# Patient Record
Sex: Male | Born: 1950
Health system: Midwestern US, Community
[De-identification: ages and names within clinical notes are randomized; demographics above are authoritative.]

## PROBLEM LIST (undated history)

## (undated) DIAGNOSIS — I8393 Asymptomatic varicose veins of bilateral lower extremities: Secondary | ICD-10-CM

## (undated) DIAGNOSIS — M79606 Pain in leg, unspecified: Secondary | ICD-10-CM

## (undated) DIAGNOSIS — Z1159 Encounter for screening for other viral diseases: Secondary | ICD-10-CM

## (undated) DIAGNOSIS — F319 Bipolar disorder, unspecified: Secondary | ICD-10-CM

## (undated) DIAGNOSIS — I219 Acute myocardial infarction, unspecified: Secondary | ICD-10-CM

## (undated) MED FILL — THERA  TABS: 30 days supply | Qty: 30 | Fill #0 | Status: AC

## (undated) MED FILL — ASPIRIN LOW DOSE 81MG CHEW: 81 MG | 30 days supply | Qty: 30 | Fill #0 | Status: AC

## (undated) MED FILL — HYDROXYZINE HCL 25MG TABS: 25 MG | 10 days supply | Qty: 30 | Fill #0 | Status: AC

## (undated) MED FILL — LEADER INSULIN SYR 31G X 5/16"X .3ML: 31G X 5/16" 0.3 ML | 30 days supply | Qty: 60 | Fill #0 | Status: AC

## (undated) MED FILL — ALPRAZolam 2MG TAB: 2 MG | 14 days supply | Qty: 21 | Fill #0 | Status: AC

## (undated) MED FILL — THIAMINE MONONITRATE 100MG TABS: 100 MG | 30 days supply | Qty: 30 | Fill #0 | Status: AC

## (undated) MED FILL — PRODIGY TWIST TOP LANCETS 28  MISC: 30 days supply | Qty: 100 | Fill #0 | Status: AC

## (undated) MED FILL — ALBUTEROL SULFATE HFA 108 AERS: 108 (90 Base) MCG/ACT | 15 days supply | Qty: 9 | Fill #0 | Status: AC

## (undated) MED FILL — STIMULANT LAXATIVE 8.6-50MG TABS: 8.6-50 MG | 30 days supply | Qty: 30 | Fill #0 | Status: AC

## (undated) MED FILL — PRODIGY AUTOCODE BLOOD W/DEVICE KIT: w/Device | 30 days supply | Qty: 1 | Fill #0 | Status: AC

## (undated) MED FILL — FOLIC ACID 1MG TABS: 1 MG | 30 days supply | Qty: 30 | Fill #0 | Status: AC

## (undated) MED FILL — LISINOPRIL 10MG TABS: 10 MG | 30 days supply | Qty: 60 | Fill #0 | Status: AC

## (undated) MED FILL — PRODIGY NO CODING BLOOD GLUC  STRP: 30 days supply | Qty: 100 | Fill #0 | Status: AC

## (undated) MED FILL — NOVOLIN N INJ U-100: 100 UNIT/ML | 27 days supply | Qty: 10 | Fill #0 | Status: AC

## (undated) MED FILL — OXYCODONE-ACETAMINOPHEN 5-325 TABS: 5-325 MG | 5 days supply | Qty: 15 | Fill #0 | Status: AC

## (undated) MED FILL — ATORVASTATIN CALCIUM 40MG TABS: 40 MG | 30 days supply | Qty: 30 | Fill #0 | Status: AC

---

## 2008-04-08 ENCOUNTER — Inpatient Hospital Stay (HOSPITAL_COMMUNITY): Admission: EM | Admit: 2008-04-08 | Discharge: 2008-04-11 | Payer: Self-pay | Admitting: Emergency Medicine

## 2008-04-08 ENCOUNTER — Ambulatory Visit: Payer: Self-pay | Admitting: Cardiovascular Disease

## 2008-04-10 ENCOUNTER — Ambulatory Visit: Payer: Self-pay | Admitting: Vascular Surgery

## 2008-04-10 ENCOUNTER — Encounter (INDEPENDENT_AMBULATORY_CARE_PROVIDER_SITE_OTHER): Payer: Self-pay | Admitting: Internal Medicine

## 2008-04-14 ENCOUNTER — Ambulatory Visit: Payer: Self-pay | Admitting: *Deleted

## 2008-07-06 ENCOUNTER — Inpatient Hospital Stay (HOSPITAL_COMMUNITY): Admission: EM | Admit: 2008-07-06 | Discharge: 2008-07-09 | Payer: Self-pay | Admitting: Emergency Medicine

## 2008-07-06 ENCOUNTER — Ambulatory Visit: Payer: Self-pay | Admitting: Internal Medicine

## 2008-07-16 ENCOUNTER — Inpatient Hospital Stay (HOSPITAL_COMMUNITY): Admission: AD | Admit: 2008-07-16 | Discharge: 2008-07-30 | Payer: Self-pay | Admitting: Psychiatry

## 2008-07-16 ENCOUNTER — Encounter: Payer: Self-pay | Admitting: Emergency Medicine

## 2008-07-16 ENCOUNTER — Ambulatory Visit: Payer: Self-pay | Admitting: Psychiatry

## 2008-07-19 ENCOUNTER — Encounter: Payer: Self-pay | Admitting: Emergency Medicine

## 2008-07-26 ENCOUNTER — Encounter (HOSPITAL_COMMUNITY): Payer: Self-pay | Admitting: Psychiatry

## 2008-10-23 ENCOUNTER — Emergency Department (HOSPITAL_COMMUNITY): Admission: EM | Admit: 2008-10-23 | Discharge: 2008-10-23 | Payer: Self-pay | Admitting: Emergency Medicine

## 2008-10-27 ENCOUNTER — Emergency Department (HOSPITAL_COMMUNITY): Admission: EM | Admit: 2008-10-27 | Discharge: 2008-10-28 | Payer: Self-pay | Admitting: Emergency Medicine

## 2010-08-25 IMAGING — CR DG CHEST 2V
2 series · 2 of 2 positions shown · non-contrast
Comparison: 07/26/2008

CLINICAL DATA: 57-year-old male fever, chills, pain

CHEST - 2 VIEW

[w chest pa]
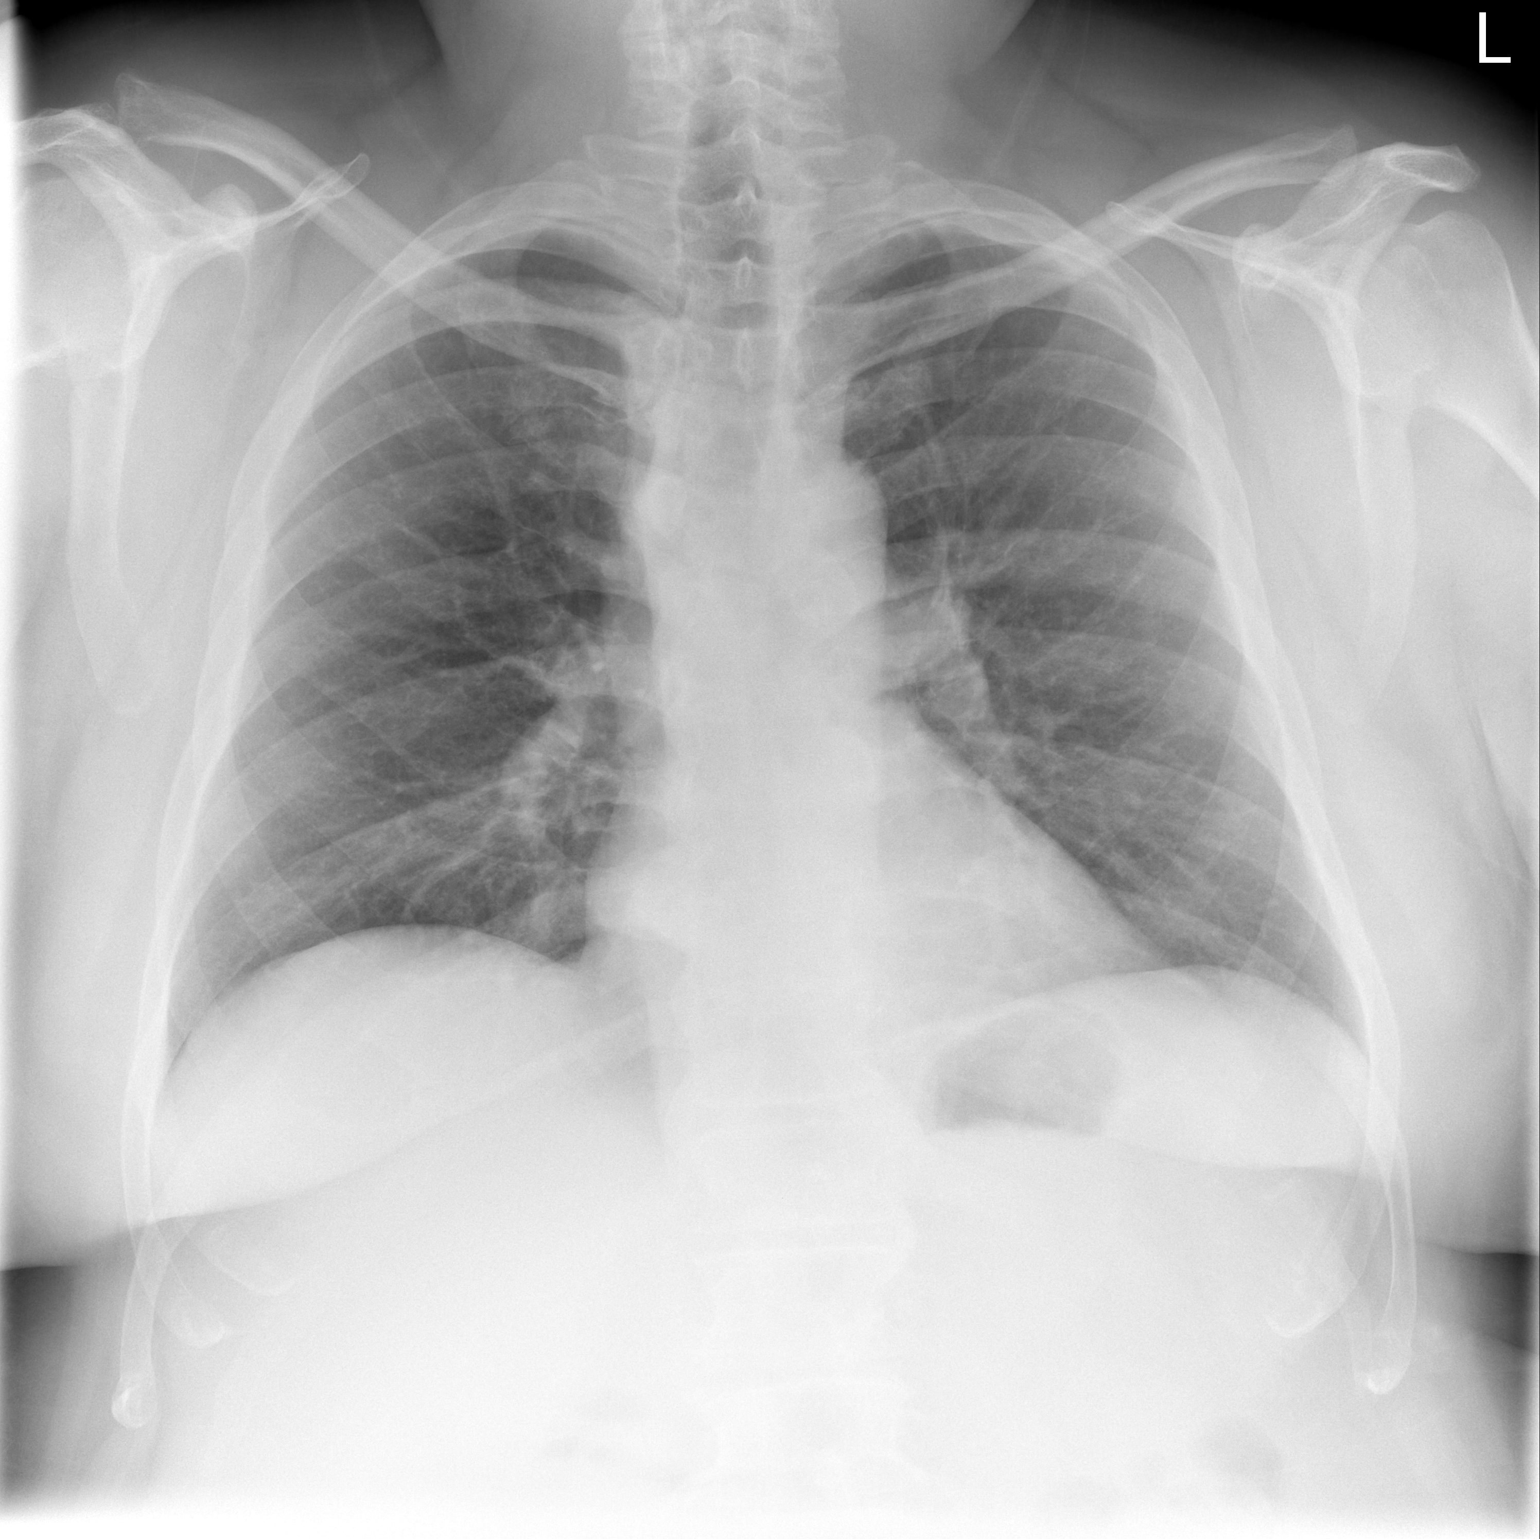

[w chest lat]
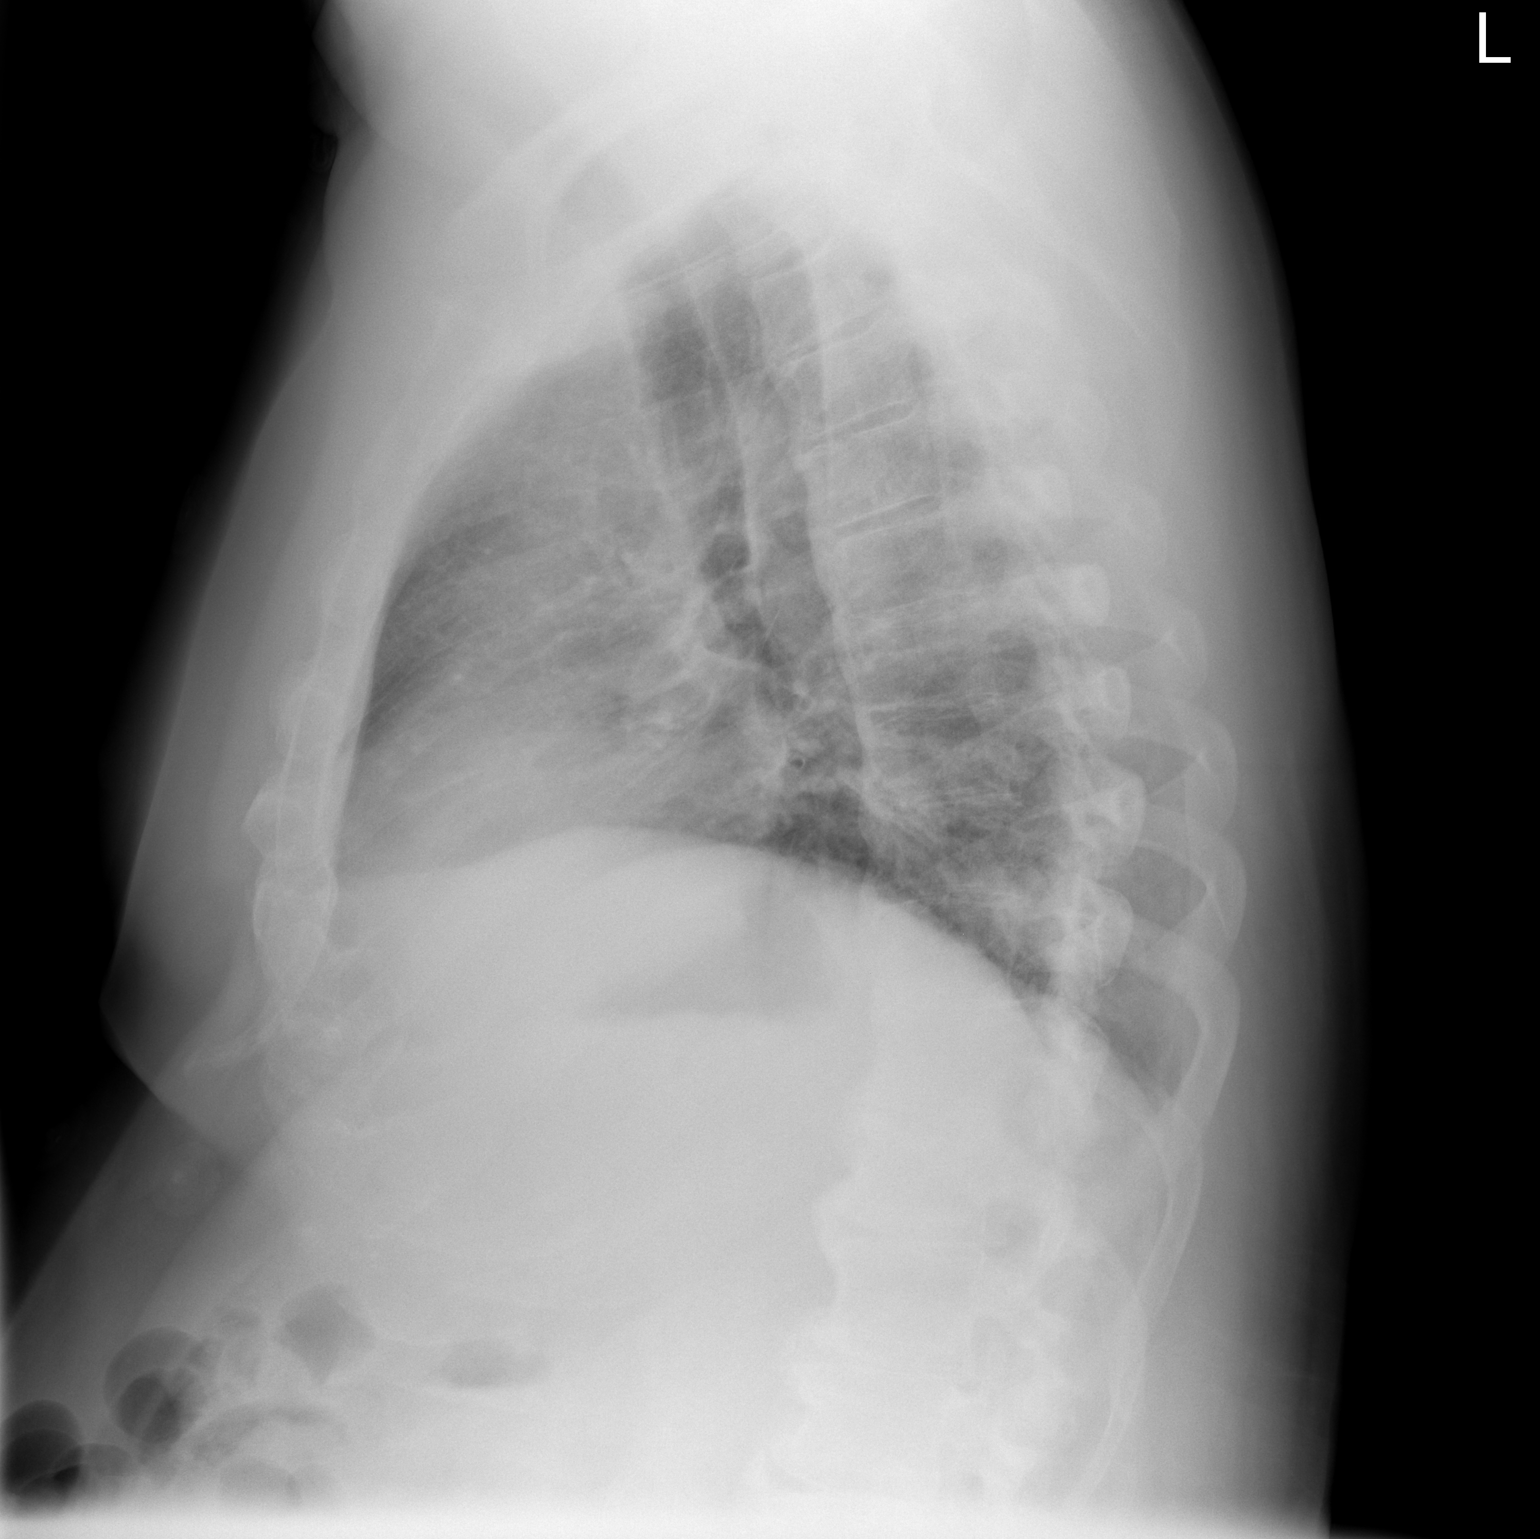

[2 of 2 positions shown; findings below may reference images not displayed]

FINDINGS: The heart size and mediastinal contours are within normal
limits.  Both lungs are clear.  The visualized skeletal structures
are unremarkable. Degenerative changes are noted of the thoracic
spine.
IMPRESSION: No active cardiopulmonary disease.

## 2011-03-14 NOTE — H&P (Signed)
Antonio Sparks, Antonio Sparks             ACCOUNT NO.:  [id number]   MEDICAL RECORD NO.:  [id number]          PATIENT TYPE:  INP   LOCATION:  3707                         FACILITY:  [facility]   PHYSICIAN:  Antonio Sparks, MDDATE OF BIRTH:  1951/06/02   DATE OF ADMISSION:  07/05/2008  DATE OF DISCHARGE:                              HISTORY & PHYSICAL   PRIMARY CARE PHYSICIAN:  Unassigned.   CHIEF COMPLAINT:  Chest pain.   HISTORY OF PRESENT ILLNESS:  A 60 year old male with history of diabetes  mellitus type 2, hypertension, chronic back pain and general anxiety  disorder presented to the ER complaining of chest pain.  The patient has  been having chest pain over the last 2 days which woke him up from sleep  2 days ago which lasted for 10 minutes and got relieved by itself.  Last  evening when he was trying to walk he developed chest pain wherein  person going on the road saw him and picked him up and brought him to  the ER.  The patient stated the chest pain is retrosternal compressing  in nature, radiating to his neck.  The patient in addition also has been  noticed to have wheezing and shortness of breath with some cough and  productive sputum which he stated this was green in color.  Denies any  fever or chills.  Chest pain is constant and radiating to neck, present  at rest and with exertion.  Denies any nausea, but did have diaphoresis.  Denies any diarrhea, abdominal pain, dysuria, loss of consciousness,  dizziness, weakness of limbs.   PAST MEDICAL HISTORY:  1. Diabetes mellitus type 2.  2. Hypertension.  3. COPD.  4. General anxiety disorder.  5. Chronic back pain.   MEDICATION PRIOR ADMISSION:  The patient states he has not been taking  medication as advised for last 1 month.   ALLERGIES:  CODEINE.   SOCIAL HISTORY:  The patient smokes cigarettes and has been advised to  quit smoking.  Denies any alcohol or drug abuse.   FAMILY HISTORY:  The patient's father had MI at  age 8.   REVIEW OF SYSTEMS:  As present in history of present illness.  Nothing  else significant.   PHYSICAL EXAMINATION:  GENERAL:  The patient examined at bedside not in  acute distress.  VITAL SIGNS:  Blood pressure is 140/70, pulse 87 per minute, temperature  97.8, respirations 20 per minute, O2 saturations 97% on room air.  HEENT: Anicteric.  No pallor.  CHEST:  Bilateral air entry present, bilaterally expiratory wheeze.  No  crepitation.  HEART:  S1-S2 heard.  ABDOMEN:  Soft, nontender.  Bowel sounds heard.  No guarding or  rigidity.  CNS:  The patient is alert, awake, oriented to time, place and person.  Moves upper and lower extremities 5/5.  EXTREMITIES:  Peripheral pulses felt.  No edema.   LABORATORY DATA:  CT angio of his chest shows no gross evidence of  pulmonary emboli, no significant change since prior study.  EKG; normal  sinus rhythm with no acute ST-T changes.  Chest x-ray showed  mild  chronic bronchitis changes but no acute pulmonary changes.   CBC; WBC is 10.1, hemoglobin 14.1, hematocrit 41.6, platelets 244,  neutrophils 64%, PT/INR 12.5 and 0.9.  Basic metabolic panel; sodium  139, potassium 4.1, chloride 106, carbon dioxide 27, glucose 150, BUN  15, creatinine 1.1, calcium 9, CK-MB less than 1, troponin I less than  0.05.  Stool for occult is negative.   ASSESSMENT:  1. Chest pain to rule out acute chest syndrome.  2. Chronic obstructive pulmonary disease.  3. Headache.  4. Ongoing tobacco abuse.  5. Diabetes mellitus type 2.  6. Hypertension.  7. Noncompliance.   PLAN:  Admit the patient to telemetry.  Will cycle cardiac markers.  We  will get CT of the head as the patient is complaining of headache.  We  will place the patient on Solu-Medrol, aspirin, bronchodilators, Avelox.  I have advised the patient to quit smoking and further recommendations  as patient's condition evolves.      Antonio Clos, MD  Electronically Signed      ANK/MEDQ  D:  07/06/2008  T:  07/06/2008  Job:  [id number]

## 2011-03-14 NOTE — Discharge Summary (Signed)
Antonio Sparks, Antonio Sparks             ACCOUNT NO.:  [id number]   MEDICAL RECORD NO.:  [id number]          PATIENT TYPE:  INP   LOCATION:  4741                         FACILITY:  [facility]   PHYSICIAN:  Lonia Blood, M.D.DATE OF BIRTH:  [birth year]   DATE OF ADMISSION:  04/08/2008  DATE OF DISCHARGE:  04/11/2008                               DISCHARGE SUMMARY   PRIMARY CARE PHYSICIAN:  [facility].   DISCHARGE DIAGNOSES:  1. Chest pressure, rule out unstable angina pectoris.      a.     Serial cardiac enzymes entirely negative.      b.     Serial EKGs completely normal.      c.     Outpatient risk stratification felt most appropriate.  2. Emphysema/COPD.      a.     Discontinued smoking approximately 15-20 years ago.      b.     Acute infected bronchitis by clinical exam.      c.     Improving with modest neb treatment and antibiotic  3. Diabetes mellitus - well controlled on Lantus.  4. Hypertension.  5. Anxiety disorder/panic attacks.  6. Coronary artery disease reported by patient with history of MI in      [place], [place], but no evidence of such on EKG during his hospital      stay.  7. Chronic low back pain on OxyContin.  8. Status post arthroscopic surgery left knee.  9. Status post colonoscopy approximately 2 years ago with resection of      what the patient reports was a probable adenomatous polyp.   DISCHARGE MEDICATIONS:  1. Advair discus 250/50 one puff b.i.d.  2. Oxycodone SR 40 mg p.o. b.i.d.  3. Ultram 50 mg 1-2 p.o. q.6 h p.r.n. breakthrough pain.  4. Celexa 10 mg p.o. daily.  5. Coreg 6.25 mg p.o. b.i.d.  6. Lantus insulin 10 units subcu q.h.s.  7. Avelox 400 mg p.o. daily x5 days.  8. Xanax 1 mg p.o. t.i.d. p.r.n.   FOLLOW UP:  The patient has already established his primary care with  [facility].  He is advised to keep his scheduled follow-up  appointment there and to be compliant with all of his medications until  that time.  In the outpatient  setting, the patient would be an due for a  simple routine maintenance issues to include repeat colonoscopy for  evaluation of his history of adenomatous colon polyps as well as an  outpatient cardiac stress test.   PROCEDURES:  None.   CONSULTATIONS:  None.   HOSPITAL COURSE:  Mr. Antonio Sparks is a 60 year old gentleman who has  recently moved back to the [place] area.  He appears to be somewhat  of a nomad and has lived in different regions in the last few years.  He  presented primarily with complaint of chest pain and some shortness of  breath.  Out of fear for unstable angina pectoris,  the patient was  admitted to the acute units.  He was monitored on telemetry which  remained normal.  Serial EKGs were obtained which were  entirely  negative.  Serial cardiac enzymes were obtained and were also entirely  negative.  As a result, the patient was not felt to require inpatient  risk stratification further and was cleared for discharge on aspirin and  beta-blocker therapy.   The patient did have symptoms during his hospital stay, however, that  appeared more and more consistent with an acute infective bronchitis.  The patient was placed on IV antibiotics and tolerated this well.  He  will continue Advair in the outpatient setting in addition to Avelox for  five additional days.  He has not smoked in multiple years and he was  advised that he should absolutely not resume doing so.   Fortunately, the patient's diabetes was very easy to control on Lantus  insulin during his hospital stay.  Additionally, blood pressure was very  well-controlled.  He is discharged on above-listed medical regimen.  No  other significant acute medical problems were encountered during his  hospital stay.      Lonia Blood, M.D.  Electronically Signed     JTM/MEDQ  D:  04/11/2008  T:  04/11/2008  Job:  [id number]   cc:   Melvern Banker  Dr. Reche Dixon

## 2011-03-14 NOTE — H&P (Signed)
Antonio Sparks, HORNBACK             ACCOUNT NO.:  [id number]   MEDICAL RECORD NO.:  [id number]          PATIENT TYPE:  IPS   LOCATION:  0500                          [facility]ILITY[facility]  [facility]   PHYSICIAN:  Geoffery Lyons, M.D.      DATE OF BIRTH:  May 07, 1951   DATE OF ADMISSION:  07/16/2008  DATE OF DISCHARGE:  07/19/2008                       PSYCHIATRIC ADMISSION ASSESSMENT   TIME:  9:30 a.m.   IDENTIFYING INFORMATION:  A 60 year old Caucasian male.  This is a  voluntary admission.   HISTORY OF PRESENT ILLNESS:  First [facility] admission for this 60 year old  who presented after relapsing on drugs about 6 months ago.  He has been  on a crack binge for the previous 3 days and says that he has taken  about $600 worth of cocaine in the past 3 days.  He reports over the  course of the past 3 months, his drug use has escalated markedly.  Says  that he had also been doing opiates after his physician had prescribed  him 42 OxyContin tablets for his back pain, reported taking it orally,  IV drug use and snorting it.  Also has been doing heroin whatever he can  get his hands on it and crack several times a day and daily for the past  week.  He cites his escalating drug use to depression and acute grief as  he approaches the 7 year anniversary of his wife's death.  He admits to  severe and protracted grief over her death.  Since her death 7 years  ago, he has been unable to achieve any significant period of drug  abstinence.  He began using substances at the age of 33 starting with  marijuana.  Over the course of his lifetime, he has used LSD, beer,  various forms of liquor, marijuana, crack cocaine and heroin.  He does  have a history of IV drug use.  He has had no significant periods of  sobriety and is seeking long-term care.  His home situation is unstable.  He has been staying in various houses with friends moving from town to  town.  He is estranged from his daughter and having poor family and  social  supports is making things worse for him.  He feels unable to get  stable.  He endorses passive suicidal thoughts without a plan.   PAST PSYCHIATRIC HISTORY:  First [facility] admission.  He is cooperative.  Has  an extensive history of polysubstance abuse.  Very tearful today and not  quite clear on his previous length of sobriety or if he has had any  significant drug rehab treatment outside of short-term detox.  He has  denied  previous suicide attempts.   SOCIAL HISTORY:  Single Caucasian male, widowed.  Has been self-employed  for the past 29 years.  Has skills as a Psychologist, occupational.  Has 1 daughter that he  is estranged from.  She does not want to see until he cleans up and is  drug free.  He has no stable home situation.  No current legal charges.   MEDICAL HISTORY:  No regular primary care physician.  He has been seen  in the emergency room and urgent care.  Has significant history of  diabetes mellitus, hypertension, some arthritis and chronic back pain.   CURRENT MEDICATIONS:  1. Seroquel 50 mg t.i.d.  2. Xanax 2 mg t.i.d.  3. OxyContin 40 mg sustained release b.i.d.  4. Advair Discus 250/50 1 puff b.i.d.  5. Gabapentin 100 mg p.o. t.i.d.  6. Celexa 20 mg daily.  7. Elavil 50 mg p.o. q.h.s.  8. Metformin 500 mg daily.  9. NovoLog 70/30 insulin 6 units at 8 a.m. and 2 p.m.  10.Lantus insulin 10 units q.h.s.  11.Lisinopril 20 mg daily.  12.Aspirin 325 mg daily.  13.He also takes Zocor 40 mg daily.  14.Synthroid 25 mcg daily.  15.He has taken Lasix in the past, not currently taking this.  His      medication compliance is sketchy at best.   DRUG ALLERGIES:  CODEINE.   PHYSICAL EXAMINATION:  GENERAL:  Physical exam was done in the emergency  room as noted in the record.  He was cooperative, but a bit agitated in  the emergency room and did receive some Haldol there.  Physical exam was  fairly unremarkable as noted in the record.  VITAL SIGNS:  Temperature 97.4, pulse 98, respirations  18, blood  pressure 148/85, 5 feet 11 inches tall, 310 pounds, temperature 98.1,  pulse 82, respirations 22, blood pressure 149/92.   LABORATORY DATA:  Alcohol level less than 5.  His urine drug screen  positive for cocaine and benzodiazepines.  CBC:  WBC 12.9, hemoglobin  14.1, hematocrit 42, platelets 266,000 and MCV 87.8.  Chemistry:  Sodium  140, potassium 4.3, chloride 104, carbon dioxide 27, BUN 13, creatinine  1.13, random glucose 160.  Liver enzymes:  SGOT 21, SGPT 24, alkaline  phosphatase 69 and total bilirubin 0.6.  Routine urinalysis was  remarkable for cloudy yellow urine with a specific gravity of 1.025 and  negative for ketones, bilirubin, glucose and protein.  No WBCs noted.   DIAGNOSTICS:  Chest x-ray was clear other than some chronically  increased interstitial lung markings noted.   MENTAL STATUS EXAM:  Fully alert, cooperative, very tearful, mildly  agitated.  Feels hopeless after the death of his wife.  Having passive  suicidal thoughts about wanting to be with her.  Feels hopeless about  being able to get his life together and achieve any type of abstinence.  Mood is very depressed.  Speech is normal.  He is oriented x4 and in  full contact with reality.  No delusional statements.  Thinking is  linear, goal directed.  Speech is relevant.  Insight is adequate.  Impulse control guarded.  Immediate recent and remote memory are intact.  No signs of delirium or confusion.   AXIS I:  Depressive disorder, not otherwise specified.  Alcohol abuse  rule out dependence.  Opiate abuse rule out dependence.  Polysubstance  abuse.  AXIS II:  Deferred.  AXIS III:  Diabetes mellitus type 2, hypertension, history of chronic  obstructive pulmonary disease, some chronic back pain.  AXIS IV:  Severe issues with protracted grief and lack of adequate  social supports.  AXIS V:  Current 36, past year not known.   PLAN:  Voluntarily admit him with a goal of a safe detox in 5 days  and  stabilize his mood.  He is admitted to our dual diagnosis program.  We  will continue his Seroquel at this point. Continue his routine diabetes  medications.  We will start him on a Librium detox protocol and evaluate  him for need for a clonidine detox protocol for the opiates.      Margaret A. Scott, N.P.      Geoffery Lyons, M.D.  Electronically Signed    MAS/MEDQ  D:  07/21/2008  T:  07/22/2008  Job:  [id number]

## 2011-03-14 NOTE — Discharge Summary (Signed)
Antonio Sparks, Antonio Sparks             ACCOUNT NO.:  [id number]   MEDICAL RECORD NO.:  [id number]          PATIENT TYPE:  INP   LOCATION:  3708                         FACILITY:  [facility]   PHYSICIAN:  Eduard Clos, MDDATE OF BIRTH:  1951-01-18   DATE OF ADMISSION:  07/05/2008  DATE OF DISCHARGE:  07/09/2008                               DISCHARGE SUMMARY   COURSE IN THE HOSPITAL:  A 60 year old male with a history of COPD,  diabetes mellitus type 2, and chronic back pain, presented with chest  pain and shortness of breath.  On admission, the patient had EKG and  cardiac enzymes, which were serially followed, which were negative for  any acute findings.  The patient had a cardiology consult.  The patient  underwent cardiac cath as per Cardiology.  This patient's cardiac cath  did not show anything significant coronary artery disease, and his left  ventricular ejection fraction was normal.  The patient's TSH was found  to be mildly elevated at 5.084, for which low-dose Synthroid was  started, and LDL was around 110, for which Zocor also was started.  The  patient on admission was found to be wheezing for which IV steroids was  initially started, and empiric antibiotics were started.  The patient's  condition gradually improved.  CT angio chest done did not show any  gross evidence of any pulmonary embolus.  CT head showed questionable  low density in the both occipital lobes.  This could be seen and  posterior reversible encephalopathy that is hypertensive encephalopathy.  There was no sign of hemorrhage.  At this time, as the patient's  symptoms have largely resolved.  The patient is symptomatic.  The  patient is discharged home with advice to follow up with his primary  care physician within a month and recheck his complete metabolic panel  and TSH as he is being started on Zocor and Synthroid, and I would  strongly advice to quit smoking and to be compliant with medications.   ASSESSMENT:  1. Atypical chest pain, resolved.  2. Status post cardiac catheterization.  3. Chronic obstructive pulmonary disease exacerbation.  4. Diabetes mellitus type 2, noncompliant.  5. Chronic low back pain.  6. History of generalized anxiety disorder.  7. Ongoing tobacco abuse.   MEDICATION ON DISCHARGE:  1. Xanax 2 mg p.o. 3 times daily.  2. OxyContin SR 40 mg twice daily.  3. Advair Diskus 250/50 one puff twice daily.  4. Metformin 500 mg p.o. daily from July 10, 2008.  5. Lantus insulin 10 units subcutaneous at bedtime.  6. Lisinopril 20 mg p.o. daily.  7. Avelox 400 p.o. daily for 3 more days.  8. Synthroid 25 mcg p.o. daily.  9. Prednisone tapering dose.  10.Zocor 40 mg p.o. daily.  11.NovoLog insulin 3 meals daily; 150-200, two units subcutaneous; 201-      250, four units subcutaneous; 251-300, six units; 301-350, eight      units subcutaneous; 351-400, ten units subcutaneous.   PLAN:  The patient is advised to follow with his primary care physician  within a month's time.  Recheck his  complete metabolic panel and TSH, to  be compliant with his medication, to be on a cardiac healthy carb-  modified diet, advised to quit smoking, maintain a blood sugar log, and  be cautious about hypoglycemia.      Eduard Clos, MD  Electronically Signed     ANK/MEDQ  D:  07/09/2008  T:  07/09/2008  Job:  [id number]

## 2011-03-14 NOTE — Consult Note (Signed)
Antonio Sparks             ACCOUNT NO.:  [id number]   MEDICAL RECORD NO.:  [id number]          PATIENT TYPE:  INP   LOCATION:  3708                         FACILITY:  [facility]   PHYSICIAN:  Bevelyn Buckles. Bensimhon, MDDATE OF BIRTH:  22-Jan-1951   DATE OF CONSULTATION:  07/07/2008  DATE OF DISCHARGE:                                 CONSULTATION   REFERRING PHYSICIAN:  Altha Harm, MD, [facility].   CARDIOLOGIST:  New to [facility].   PRIMARY CARE PHYSICIAN:  [facility].   REASON FOR CONSULTATION:  Chest pain.   Mr. Antonio Sparks is a 60 year old male with multiple medical problems  including obesity, hypertension, diabetes, COPD with ongoing tobacco  use, chronic back pain and a history of polysubstance abuse with  possible bipolar disorder as well.  He tells me that he underwent heart  catheterization in [place], [place] 4 years ago after having a positive  stress test.  He describes a balloon angioplasty, but does not think  they put a stent in.   He was admitted to [facility] in June of 2009 with chest pain.  He ruled out for myocardial infarction with serial cardiac markers.  He  was told to have an outpatient follow-up, but never followed up.  Recently he ran out of his medications and noticed that he has been  having chronic dyspnea on exertion.  The night before admission he had  an episode of chest pain and shortness of breath which lasted about 10  minutes and felt like a weight was sitting on his chest.  This resolved  spontaneously.  Yesterday he was reading and he got up to move around  and had pain in his neck which radiated to his left arm and then had  severe chest pain like a sledgehammer hit him in the chest.  He actually  doubled over and someone saw him passing by and told him to go to the  emergency room.   In the emergency room he had a CAT scan of the chest which showed no  evidence of pulmonary emboli.  Chest x-ray did show bronchitic  findings,  but no acute infiltrate.  He has had three sets of cardiac markers which  all have been negative.  His EKG is normal.  He does have a severe cough  with productive sputum.  However, he denies any current fevers or  chills.   REVIEW OF SYSTEMS:  Diffusely positive for orthopnea, PND, occasional  lower extremity edema, cough, wheezing, dysuria, hematuria, nocturia,  depression, anxiety, chronic back pain, arthritis, occasional diarrhea,  an episode of dark stool couple of days ago which has resolved, chronic  abdominal pain, constipation, occasional dysphasia.  The remainder of  the review of systems is negative except for as described above.   PAST MEDICAL HISTORY:  1. Reported coronary artery disease with cardiac catheterization 4      years ago in [place].  2. Hypertension.  3. Diabetes.  4. Anxiety/panic attacks.  5. Morbid obesity.  6. COPD with ongoing tobacco use.  7. Chronic back pain.  8. Hyperlipidemia.  9. History of polysubstance  abuse with previous cocaine use.   CURRENT MEDICATIONS:  1. Aspirin 325 a day.  2. Protonix.  3. Albuterol.  4. Atrovent.  5. Nitroglycerin paste.  6. Lantus insulin.  7. Lovenox.  8. Prednisone.  9. Levothyroxine.  10.Xanax 2 mg t.i.d.  11.Metformin 500 a day.  12.Lisinopril 20 a day.  13.OxyContin.  14.Advair.  15.Lopressor 25 b.i.d.  16.IV Avelox.   ALLERGIES:  CODEINE.   SOCIAL HISTORY:  Lives in [place] with a friend.  He is on  disability.  He has a history of tobacco.  He says he is smoking now one  cigarette per day.  He has a history of alcohol, but quit.  He also has  a history of cocaine use, but stopped 25 years ago.   FAMILY HISTORY:  Mother died at 3 due to colon cancer.  Father died 25  due to MI.  He had an MI at age 92.  He had a brother who died in his  88s due to MI.   PHYSICAL EXAMINATION:  He is sitting up in the side of the bed.  He  speaks with a very pressured speech.  He is otherwise in no  acute  distress.  He has a hacking cough.  Blood pressure is 146/84, heart rate 66.  His temperature is 97.2.  His  weight is 138 kg.  He is sating 96% on room air.  HEENT:  Normal.  NECK:  Supple and thick, unable to evaluate JVD.  Carotids are 2+  bilaterally without bruits.  There is no lymphadenopathy or thyromegaly  appreciated.  CARDIAC:  PMI is nonpalpable.  He is a regular rate and rhythm.  No  obvious murmurs, rubs or gallops.  LUNGS:  Diffuse rhonchi with no current wheezing.  ABDOMEN:  Morbidly obese, unable to appreciate hepatosplenomegaly or  masses.  There are no bruits.  There is good bowel sounds.  EXTREMITIES:  Warm with no cyanosis or clubbing.  There is trace edema  at the ankles.  No rash.  NEURO:  He is alert and oriented x3 with a very pressured speech.  Cranial nerves II-XII are grossly intact.  Moves all four extremities  without difficulty.   EKG shows normal sinus rhythm, rate of 85 with no ST-T wave  abnormalities.  White count 10.1, hemoglobin is 14.1, platelets are 244.  Sodium 139, BUN of 15, creatinine 1.1, glucose 150, hemoglobin A1c is  6.3.  CK-MB of 57, MB is 0.8, troponin 0.02, 0.01, 0.01.   ASSESSMENT/PLAN:  Mr. [name] is a 60 year old man as above with  multiple medical problems including reported coronary artery disease  status post catheterization 4 years ago in [place] and he  now presents  with two episodes of chest pain with both typical and atypical features.  ECG and cardiac markers were negative.  He has significant bronchitis on  exam.  I discussed with him the possibility of Myoview versus cardiac  catheterization and he is adamant that he wants to proceed with the  catheterization.  I explained the risk to him in depth.  He understands  these.  We will try to proceed with cath later today.  He will need  aggressive risk factor management.  Treatment of his bronchitis will be  per his primary team.      Bevelyn Buckles. Bensimhon, MD   Electronically Signed     DRB/MEDQ  D:  07/07/2008  T:  07/07/2008  Job:  [id number]

## 2011-03-14 NOTE — Cardiovascular Report (Signed)
Antonio Sparks, Antonio Sparks             ACCOUNT NO.:  [id number]   MEDICAL RECORD NO.:  [id number]          PATIENT TYPE:  INP   LOCATION:  3708                         FACILITY:  [facility]   PHYSICIAN:  Arturo Morton. Riley Kill, MD, FACCDATE OF BIRTH:  [birth year]   DATE OF PROCEDURE:  DATE OF DISCHARGE:                            CARDIAC CATHETERIZATION   INDICATIONS:  The patient is a 60 year old admitted with bronchitis-type  illness, but also with chest pain.  He was seen by Dr. Gala Romney and  subsequently set up for diagnostic cath.  The patient also has diabetes.   PROCEDURES:  1. Left heart catheterization.  2. Selective coronary arteriography.  3. Selective left ventriculography.   DESCRIPTION OF PROCEDURE:  The patient was brought to the  catheterization laboratory and prepped and draped in the usual fashion.  Through an anterior puncture, the femoral artery was entered.  A 5-  French sheath was placed using left and right coronaries obtained in  multiple angiographic projections.  Central aortic and left ventricular  pressures measured with pigtail.  Ventriculography was done in the RAO  projection.  There were no complications.  He received 2.5 mg of  intravenous Versed and 50 mg of fentanyl.  Even with this, it had little  impact.   The patient, however, tolerated everything quite well.   HEMODYNAMIC DATA:  1. Central aortic pressure is 127/80, mean 94.  2. Left ventricular pressure was 120/17.  3. There was no gradient on pullback across aortic valve.   ANGIOGRAPHIC DATA:  1. Ventriculography was done in the RAO projection.  Overall, systolic      function is vigorous.  No segmental wall motion abnormalities are      identified.  2. The left main is free of critical disease.  3. The left anterior descending artery courses to the apex and      provides 1 major diagonal branch.  The LAD is relatively smooth and      without significant focal narrowing.  4. The circumflex  provides a first marginal and then a large      bifurcating marginal circumflex and its branches are free of      critical disease.  5. The right coronary is a moderate size vessel, provides posterior      descending and posterolateral system both of which are free of      critical disease.   CONCLUSION:  1. Preserved left ventricular function.  2. No significant coronary obstruction.  3. 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  This is notable      on fluoroscopy and picture was taken.   PLAN:  The patient does not need further workup for coronary artery  disease at the present time.  He has had some difficulty swallowing.  His CT was negative for pulmonary emboli.  We will defer further workup  to the primary team.      Arturo Morton. Riley Kill, MD, [facility]  Electronically Signed     Arturo Morton. Riley Kill, MD, [facility]  Electronically Signed    TDS/MEDQ  D:  07/07/2008  T:  07/08/2008  Job:  [id number]

## 2011-03-14 NOTE — H&P (Signed)
[name], Sparks             ACCOUNT NO.:  [id number]   MEDICAL RECORD NO.:  [id number]          PATIENT TYPE:  INP   LOCATION:  1829                         FACILITY:  [facility]   PHYSICIAN:  Lonia Blood, M.D.DATE OF BIRTH:  12-Jan-1951   DATE OF ADMISSION:  04/08/2008  DATE OF DISCHARGE:                              HISTORY & PHYSICAL   PRIMARY CARE PHYSICIAN:  Unassigned.   CHIEF COMPLAINT:  Chest pain/substernal chest pressure.   HISTORY OF PRESENT ILLNESS:  Mr. Antonio Sparks is a 60 year old  gentleman who has recently moved to the [facility] area (as of April 04, 2008).  It appears that his primary medical care has been very  sketchy/spotty, because he has been moved multiple times in the last few  months.  He reports that last night prior to going to bed he developed a  sudden onset of acute substernal chest pressure.  This was described as  a heaviness on the chest.  This was associated with a sense that he  could not catch his breath.  He also became quite diaphoretic during his  spell.  He went to bed and slept without difficulty at the shelter where  he is currently residing.  This morning when he woke up, he fell weak in  general but had no specific chest pain.  He was coughing and  intermittently bringing up large volumes of primarily clear sputum.  After walking out of the homeless shelter, however, he began to  experience a substernal chest pressure again.  This was associated with  a significant sense of diaphoresis and flushing.  He became  diaphoretic again.  Ultimately when staff at the shelter noticed him,  they suggested that he be evaluated, the patient was brought via EMS to  the ER for evaluation.  At the present time, the patient states he  continues to have some substernal chest pressure.  No specific  intervention that has been provided in the ER has significantly altered  his pain.   The patient is somewhat circuitous in his history.  It is  very difficult  to encourage him to focus on one specific issue.  He has multiple  complaints, and it is very difficult to determine if these are all acute  or chronic in nature.   REVIEW OF SYSTEMS:  The patient does admit that he has had some blood in  stool.  He denies melanotic stools.  He does report some blood streaking  and intermittent maroon-colored stools.  This apparently has been going  on for at least one week.  The patient also complains of abdominal pain.  This is associated with intermittent loose diarrhea.  The exact  frequency and duration of this diarrhea is unclear to me.  The patient  denies fevers or chills.  He denies severe nausea or vomiting.  The  patient reports severe chronic weakness in his left leg which appears to  be primarily related to left knee/joint issues.  He reports he has had  an arthroscopic surgery in this area in the past.  He also complains of  severe chronic low  back pain.  It does not appear that he has had  surgery on this in the past, and he denies specific traumatic events.  The patient is quite anxious and at times tremulous.  He states he does  have a history of anxiety disorder and that he has run out of his  benzodiazepines within the last week.  In short, patient basically has a  positive complaint for every single element of the review of systems  that is asked.  For the sake of brevity, I have included in this  dictation only those elements which appear to be relatively acute or  warrant inpatient follow-up.   PAST MEDICAL HISTORY:  (per patient)  1. Hypertension.  2. Diabetes mellitus.  3. Anxiety disorder/panic attacks.  4. Possible emphysema with prior history of tobacco abuse reportedly      discontinued 20 years ago.  5. Reported history of coronary artery disease status post MI but no      apparent catheterization with treatment being administered in      [place], [place], per patient.  6. Chronic low back pain.  7. Status  post apparent arthroscopic surgery, left knee.  8. Status post colonoscopy approximately two years ago with reported      resection of 3 polyps, 1 of which may have been adenomatous.   OUTPATIENT MEDICATIONS:  Dosages unclear.  1. Combivent.  2. Adalat.  3. Advair.  4. Lantus insulin 10 units every night.  5. Metformin 500 b.i.d.  6. Novolin 70/30 6 in the morning and 7 units in the evening/  7. Xanax 0.5 mg q.8h.  8. OxyContin 40 mg b.i.d.  9. Ultram.  10.Celexa.   ALLERGIES:  CODEINE.   FAMILY HISTORY:  The patient's mother died reportedly from colon cancer  at age 59.  The patient's father died from an MI at 73.  The patient has  a brother died of an MI in his late 44s.   SOCIAL HISTORY:  The patient moved to [facility] from [place],  [place], on April 04, 2008.  He is presently residing in a homeless  shelter but is working on more permanent arrangements.  He denies  alcohol or illicit drug use.   DATA REVIEWED:  Point of care cardiac markers are negative x2.  CBC is  unremarkable with exception of elevated white count at 14.  Sodium,  potassium, chloride, bicarb, BUN, and creatinine are normal.  Serum  glucose is elevated at 128.  Chest x-ray reveals no acute disease but  raises the question of a right paratracheal isolated enlarged lymph  node.  A 12-lead EKG reveals normal sinus rhythm at 78 beats per minute  with no acute ST or T-wave change to my interpretation.   PHYSICAL EXAMINATION:  Temperature 97.1, blood pressure 111/65, heart  rate 78, respiratory rate 32, O2 saturations 98% on room air.  CBG is  127.  GENERAL:  Disheveled, poorly kempt gentleman in no acute respiratory  distress who is somewhat tremulous and anxious.  HEENT:  Normocephalic, atraumatic.  Pupils equal, round, and react to  light and accommodation.  Extraocular muscles intact bilaterally.  OC/OP  clear.  Very poor dental health with multiple carious teeth.  NECK:  No JVD.  No  lymphadenopathy, no thyromegaly.  LUNGS:  Poor air movement throughout all fields.  No focal crackles.  No  appreciable wheeze at the present time.  CARDIOVASCULAR:  Distant but regular without gallop or rub with normal  S1-S2.  No  appreciable murmur.  ABDOMEN:  Obese, distended, soft, tender to deep palpation throughout  all 4 quadrants with no focal tenderness.  No rebound, no appreciable  organomegaly.  EXTREMITIES:  1+ bilateral lower extremity edema without cyanosis or  clubbing.  NEUROLOGIC:  Alert and oriented x4.  Cranial nerves II-XII intact  bilaterally, 5/5 strength bilateral upper and lower extremities, intact  sensation to touch throughout.   IMPRESSION AND PLAN:  1. Chest pain - the patient certainly has multiple risk factors.  He      also describes what could easily be interpreted as unstable angina      pectoris.  There is no evidence of an acute MI on EKG or point care      cardiac markers.  I will admit the patient to the telemetry unit.      He will be placed on full-dose heparin as well as a nitroglycerin      drip.  We will add a beta blocker.  I will not use an ACE inhibitor      at the present time, but we will consider adding this during his      hospital stay.  Echocardiogram will be accomplished to evaluate for      wall abnormalities.  Serial EKGs and enzymes will be followed out.      Further risk stratification will be carried out as indicated based      upon the results of the above-listed tests.  2. Possible mediastinal lymphadenopathy - in this patient who presents      with substernal chest pressure and significant anxiety, I will      proceed with a CT scan of the chest to evaluate his possible      lymphadenopathy.  I will make this a CT angiogram as the patient      would have risk factors for pulmonary embolism/DVT, and this will      allow [place] to answer both questions at once.  If there is evidence of      lymphadenopathy or pulmonary lesion, we  will investigate this      further as indicated.  3. Hypertension - the patient's blood pressure is currently well-      controlled.  We will initiate a beta blocker for indications as      noted above and follow his blood pressure trend.  Given the fact      that he is also diabetic, I wish to initiate an ACE inhibitor      during the patient's hospital stay.  We will assure, however, that      his blood pressure tolerates the beta-blocker first, and we will      need to monitor his renal function.  4. Diabetes mellitus - we will check a hemoglobin A1c.  Will      administer sliding scale insulin.  We will follow CBG closely and      adjust treatment as appropriate.  5. Emphysema - the patient does not appear to be suffering with      significant wheezing at the present time.  We will administer      standing albuterol and Atrovent nebulizers and follow his clinical      symptoms.  6. Reported bright red blood per rectum/maroon stools - we will follow      the patient's hemoglobin during his hospital stay.  We will guaiac      his stools.  The patient at the very least will  need a repeat      colonoscopy during the next 6 months, but the timing of this will      be based upon the results of his cardiac evaluation.  7. Abdominal pain - we will check a KUB to rule out a simple ileus.      We will follow him clinically with stool softeners.      Lonia Blood, M.D.  Electronically Signed     JTM/MEDQ  D:  04/08/2008  T:  04/08/2008  Job:  [id number]

## 2011-03-17 NOTE — Discharge Summary (Signed)
NAME:  Antonio Sparks, Antonio Sparks NO.:  [id number]   MEDICAL RECORD NO.:  [id number]          PATIENT TYPE:  IPS   LOCATION:  0500                          [facility]ACILITY:  [facility]   PHYSICIAN:  Geoffery Lyons, M.D.      DATE OF BIRTH:  1951/04/12   DATE OF ADMISSION:  07/16/2008  DATE OF DISCHARGE:  07/30/2008                               DISCHARGE SUMMARY   CHIEF COMPLAINT AND PRESENT ILLNESS:  This was the first admission to  [facility] for this 60 year old white male voluntarily  admitted.  He relapsed on drugs about 6 months prior to this admission,  had been on crack binges for the previous 3 days.  He had taken about  600 dollars worth of cocaine in the past 3 days.  He reports over the  course of the past 3 months his drug use has escalated markedly.  Has  also been doing opiates after his physician prescribed him 42 OxyContin  for his back pain.  He reports taking it orally, IV, and snorting it.  Has been doing heroin, whatever he can get his hands on and crack  several times a day for the past week.  Cites escalating drug use to the  depression and acute grief as he approached the 7-year anniversary of  his wife's death.  Admits to severe and protracted grief over her death.  Since her death, he has not been able to achieve any significant period  of abstinence.   PAST PSYCHIATRIC HISTORY:  First time at [facility].  No active  treatment.   ALCOHOL AND DRUG HISTORY:  Began using substances at age 71 starting  with marijuana.  Over the course of the last time, he has used LSD,  alcohol, beer, various forms of liquor, marijuana, crack cocaine, and  heroin, as well as oral opiates.  No significant periods of sobriety.  Seeking long-term treatment.   MEDICAL HISTORY:  1. Diabetes mellitus.  2. Hypertension.  3. Arthritis.  4. Chronic back pain.   MEDICATIONS:  1. Seroquel 50 mg 3 times a day.  2. Xanax 2 mg 3 times a day.  3. OxyContin 40 mg  twice a day.  4. Advair Diskus 250/50 one puff twice a day.  5. Neurontin 100 mg 3 times a day.  6. Celexa 20 mg per day.  7. Elavil Habit 50 mg at night.  8. Metformin 500 mg per day.  9. NovoLog 70/30 at 6 units in the morning and the afternoon.  10.Lantus insulin 10 units at bedtime.  11.Lisinopril 20 mg per day.  12.Aspirin 325 mg per day.  13.Zocor 40 mg per day.  14.Synthroid 25 mcg per day.  15.He had been taking Lasix in the past.   Compliance with all medications is questionable.   PHYSICAL EXAM:  Failed to show any acute findings.   LABORATORY WORKUP:  UDS positive for cocaine, benzodiazepines, no  opiates.  CBC, white blood cells 12.9, hemoglobin 14.1, sodium 140,  potassium 4.3, BUN 13, creatinine 1.1, glucose 160, SGOT 21, SGPT 24,  total bilirubin 0.6.   MENTAL STATUS  EXAM:  Reveals a fully alert, cooperative male, tearful,  somewhat agitated, feeling hopeless after the death of his wife, having  passive suicidal thoughts about wanting to be with her, feeling hopeless  about being unable to get his life together and achieve any type of  abstinence.  Mood depressed.  Affect depressed.  Thought processes  logical, coherent, and relevant.  Speech was normal in rate, tempo, and  production.  Cognition well preserved.   ADMISSION DIAGNOSES:  AXIS I:  1. Major depressive disorder.  2. Opiate dependence.  3. Benzodiazepine dependence.  4. Alcohol abuse.  5. Marijuana abuse./  6. Cocaine abuse.  AXIS II:  No diagnosis.  AXIS III:  1. Diabetes mellitus, type 2.  2. Hypertension.  3. History of chronic obstructive pulmonary disease.  4. Chronic back pain.  AXIS IV:  Moderate.  AXIS V:  Upon admission, 35, highest Global Assessment of Functioning in  the last year 60.   COURSE IN THE HOSPITAL:  Was admitted, started individual and group  psychotherapy.  We detoxified with Librium.  Maintained his medications  except the Xanax and the OxyContin.  We worked with the  Seroquel with  Neurontin.  Eventually, we started him on Cymbalta.  We adjusted his  medications through the course of his stay.  As already stated, a 60-  year-old male with polysubstance dependence.  Admits to increased  depression.  Claimed that since his wife died he has not been able to  get his life back together.  He was on Xanax.  He was on opiates.  He  claims he quit these as he got depressed, relapsed using multiple drugs  and alcohol, cannot say how much but as already stated UDS was positive  for cocaine and benzodiazepines.  Mostly somatically focused.  Endorsed  he was withdrawing, having a very hard time, aches, pains, agitation,  very labile, wanting to give up.  He continued to have a very hard time,  labile, tearful, hurting in his back, hurting in his heart, wanting to  get his life back together.  There was an episode July 19, 2008,  endorsed chest pain, left chest with left side of face into the jaw.  Upon initial assessment, blood pressure was 146/103.  He was  diaphoretic, shaking.  He was given aspirin and oxygen.  He was taken to  the ED.  He was assessed to have bronchitis and treated accordingly.  He  continued to have a hard time with anxiety, agitation, cravings.  Endorsed he wanted to use so bad that he could not deal with the state  of affairs, frustrated, claimed withdrawing with feeling achy, diarrhea.  Sleep continued to be an issue.  He continued to focus on multiple  somatic complaints.  Complained of a growth in his axillary area.  The  insulin had to be adjusted but he was not keeping up with the diet.  Continued to eat high carbohydrate, high sugar content, snacks, and  meals.  He was encouraged to keep the diet as much as possible.  We  continued to work to stabilize his mood.  Sleep started getting a little  better.  He was willing to take more Neurontin.  He was not going to be  able to have any opiates with the pain.  He was wanting to go  into a  rehab program.  There was an incident with another patient who  apparently rolled his wheelchair over his toes, was shaking all day,  would have liked a Xanax but endorsed that he understood he could not  have one.  We increased Neurontin to 600 mg 3 times a day and at bedtime  and Seroquel to 100 mg 3 times a day and 400 at bedtime.  We add  Vistaril as needed for anxiety.  The workup of incidents in the evening  did require staff intervention.  He went back and forth if he was  willing [facility] [place].  He was  concerned about the cost, wanting it to be free.  Continued to be  somatically focused but endorsed depression and anxiety.  He had  developed a cough that was addressed.  He did admit he was having a hard  time not having Xanax or Percocet.  July 29, 2008, was anticipating  [facility].  There were some issues in terms of his affording  medication.  We facilitate the transfer.  We allowed him to have  medication from our supply.  So on July 30, 2008, he was in full  contact with reality.  He was accepted to go to [facility]  in [place] where he was going to continue to work on long-term  abstinence.   DISCHARGE DIAGNOSES:  AXIS I:  1. Major depressive disorder.  2. Opiate dependence.  3. Benzodiazepine and cocaine dependence.  AXIS II:  No diagnosis.  AXIS III:  1. Diabetes mellitus, type 2.  2. Hypertension.  3. Chronic obstructive pulmonary disease.  4. Chronic back pain.  5. Status post bronchitis.  6. Hypercholesterolemia.  7. Hypothyroidism.  AXIS IV:  Moderate.  AXIS V:  Upon discharge 55 to 60.   MEDICATIONS:  1. Zocor 40 mg per day.  2. Synthroid 25 mcg per day.  3. Advair Diskus 250/50 one puff twice a day.  4. Glucophage 500 mg per day.  5. Lisinopril 20 mg per day.  6. Aspirin 325 mg per day.  7. Colace 100 mg per day.  8. Seroquel 400 mg at bedtime 100 mg 3 times a day.  9.  Neurontin 300 mg 2 tabs 4 times a day.  10.Saline nasal spray as needed.  11.Skelaxin 100 mg one 2 to 3 times a day as needed for muscle spasm.  12.Cymbalta 30 mg per day.  13.Trazodone 300 mg at bedtime.   FOLLOWUP:  [facility] in [place].      Geoffery Lyons, M.D.  Electronically Signed    IL/MEDQ  D:  08/21/2008  T:  08/21/2008  Job:  [id number]

## 2011-07-27 LAB — POCT I-STAT, CHEM 8
BUN: 18
Calcium, Ion: 1.13
Chloride: 107
Glucose, Bld: 128 — ABNORMAL HIGH
TCO2: 24

## 2011-07-27 LAB — CBC
HCT: 40.9
HCT: 45.5
Hemoglobin: 13.8
Hemoglobin: 15.8
MCHC: 33.7
MCHC: 34.7
Platelets: 236
RBC: 5.26
RDW: 13.5
RDW: 13.6
RDW: 13.7

## 2011-07-27 LAB — COMPREHENSIVE METABOLIC PANEL
Albumin: 3.1 — ABNORMAL LOW
Alkaline Phosphatase: 65
BUN: 15
CO2: 28
Chloride: 107
Creatinine, Ser: 1.04
GFR calc non Af Amer: >60
Glucose, Bld: 182 — ABNORMAL HIGH
Potassium: 3.7
Total Bilirubin: 0.8

## 2011-07-27 LAB — CARDIAC PANEL(CRET KIN+CKTOT+MB+TROPI)
CK, MB: 1.3
Relative Index: INVALID
Total CK: 62
Total CK: 73
Troponin I: 0.01
Troponin I: <0.01
Troponin I: <0.01

## 2011-07-27 LAB — BASIC METABOLIC PANEL
CO2: 25
GFR calc non Af Amer: >60
Glucose, Bld: 95
Potassium: 4.1
Sodium: 140

## 2011-07-27 LAB — HEMOGLOBIN A1C
Hgb A1c MFr Bld: 6.4 — ABNORMAL HIGH
Mean Plasma Glucose: 151

## 2011-07-27 LAB — POCT CARDIAC MARKERS
CKMB, poc: 1
Myoglobin, poc: 38.4
Operator id: 234501
Operator id: 295021
Troponin i, poc: <0.05
Troponin i, poc: <0.05

## 2011-07-27 LAB — MAGNESIUM: Magnesium: 2

## 2011-07-27 LAB — DIFFERENTIAL
Basophils Absolute: 0.1
Eosinophils Relative: 2
Lymphocytes Relative: 19
Monocytes Absolute: 0.8
Monocytes Relative: 6
Neutro Abs: 10.2 — ABNORMAL HIGH

## 2011-07-27 LAB — B-NATRIURETIC PEPTIDE (CONVERTED LAB): Pro B Natriuretic peptide (BNP): <30

## 2011-07-27 LAB — LIPID PANEL: VLDL: 30

## 2011-07-27 LAB — PROTIME-INR: INR: 1

## 2011-07-31 LAB — URINALYSIS, ROUTINE W REFLEX MICROSCOPIC
Glucose, UA: NEGATIVE
Hgb urine dipstick: NEGATIVE
Ketones, ur: NEGATIVE
Protein, ur: NEGATIVE
pH: 5

## 2011-07-31 LAB — GLUCOSE, CAPILLARY
Glucose-Capillary: 138 — ABNORMAL HIGH
Glucose-Capillary: 159 — ABNORMAL HIGH
Glucose-Capillary: 186 — ABNORMAL HIGH
Glucose-Capillary: 194 — ABNORMAL HIGH
Glucose-Capillary: 203 — ABNORMAL HIGH
Glucose-Capillary: 205 — ABNORMAL HIGH
Glucose-Capillary: 217 — ABNORMAL HIGH
Glucose-Capillary: 231 — ABNORMAL HIGH
Glucose-Capillary: 236 — ABNORMAL HIGH
Glucose-Capillary: 239 — ABNORMAL HIGH
Glucose-Capillary: 239 — ABNORMAL HIGH
Glucose-Capillary: 245 — ABNORMAL HIGH
Glucose-Capillary: 273 — ABNORMAL HIGH
Glucose-Capillary: 273 — ABNORMAL HIGH
Glucose-Capillary: 286 — ABNORMAL HIGH
Glucose-Capillary: 296 — ABNORMAL HIGH
Glucose-Capillary: 306 — ABNORMAL HIGH
Glucose-Capillary: 313 — ABNORMAL HIGH
Glucose-Capillary: 313 — ABNORMAL HIGH
Glucose-Capillary: 322 — ABNORMAL HIGH
Glucose-Capillary: 343 — ABNORMAL HIGH
Glucose-Capillary: 344 — ABNORMAL HIGH
Glucose-Capillary: 362 — ABNORMAL HIGH
Glucose-Capillary: 380 — ABNORMAL HIGH
Glucose-Capillary: 410 — ABNORMAL HIGH

## 2011-07-31 LAB — RAPID URINE DRUG SCREEN, HOSP PERFORMED
Opiates: NOT DETECTED
Tetrahydrocannabinol: NOT DETECTED

## 2011-07-31 LAB — POCT CARDIAC MARKERS: CKMB, poc: <1 — ABNORMAL LOW

## 2011-07-31 LAB — DIFFERENTIAL
Eosinophils Absolute: 0.5
Eosinophils Relative: 4
Lymphs Abs: 2.8
Monocytes Absolute: 0.9
Monocytes Relative: 7

## 2011-07-31 LAB — COMPREHENSIVE METABOLIC PANEL
ALT: 24
AST: 21
Albumin: 3.2 — ABNORMAL LOW
CO2: 27
Calcium: 9
GFR calc Af Amer: >60
GFR calc non Af Amer: >60
Sodium: 140
Total Protein: 6.1

## 2011-07-31 LAB — CBC
MCHC: 33.6
Platelets: 266
RBC: 4.79

## 2011-08-01 LAB — GLUCOSE, CAPILLARY
Glucose-Capillary: 131 — ABNORMAL HIGH
Glucose-Capillary: 152 — ABNORMAL HIGH
Glucose-Capillary: 281 — ABNORMAL HIGH

## 2011-08-02 LAB — CBC
HCT: 40.4
HCT: 40.9
Hemoglobin: 13.4
Hemoglobin: 14.3
MCHC: 35
MCV: 86.6
MCV: 87.1
MCV: 88
Platelets: 209
Platelets: 210
Platelets: 244
RBC: 4.59
RDW: 14.5
RDW: 14.5
WBC: 10.1
WBC: 8.5

## 2011-08-02 LAB — BASIC METABOLIC PANEL
BUN: 15
BUN: 18
BUN: 23
CO2: 23
Chloride: 103
Creatinine, Ser: 1.11
GFR calc non Af Amer: >60
GFR calc non Af Amer: >60
GFR calc non Af Amer: >60
Glucose, Bld: 107 — ABNORMAL HIGH
Glucose, Bld: 150 — ABNORMAL HIGH
Glucose, Bld: 232 — ABNORMAL HIGH
Potassium: 3.8
Potassium: 4.2
Sodium: 138
Sodium: 140

## 2011-08-02 LAB — COMPREHENSIVE METABOLIC PANEL
Alkaline Phosphatase: 61
BUN: 16
CO2: 27
Chloride: 106
Creatinine, Ser: 0.99
GFR calc non Af Amer: >60
Glucose, Bld: 133 — ABNORMAL HIGH
Potassium: 3.8
Total Bilirubin: 0.7

## 2011-08-02 LAB — POCT CARDIAC MARKERS
CKMB, poc: 1.5
CKMB, poc: <1 — ABNORMAL LOW
Troponin i, poc: <0.05
Troponin i, poc: <0.05
Troponin i, poc: <0.05

## 2011-08-02 LAB — LIPID PANEL: Triglycerides: 224 — ABNORMAL HIGH

## 2011-08-02 LAB — PROTIME-INR
INR: 0.9
Prothrombin Time: 12.5

## 2011-08-02 LAB — DIFFERENTIAL
Basophils Absolute: 0
Eosinophils Absolute: 0.4
Eosinophils Relative: 4
Lymphocytes Relative: 23
Lymphs Abs: 2.3
Neutrophils Relative %: 64

## 2011-08-02 LAB — HEMOGLOBIN A1C
Hgb A1c MFr Bld: 6.3 — ABNORMAL HIGH
Hgb A1c MFr Bld: 6.4 — ABNORMAL HIGH
Mean Plasma Glucose: 137

## 2011-08-02 LAB — CK TOTAL AND CKMB (NOT AT ARMC)
Relative Index: 1.4
Total CK: 117

## 2011-08-02 LAB — CARDIAC PANEL(CRET KIN+CKTOT+MB+TROPI)
Relative Index: INVALID
Relative Index: INVALID
Troponin I: 0.01
Troponin I: 0.01

## 2011-08-02 LAB — GLUCOSE, CAPILLARY
Glucose-Capillary: 106 — ABNORMAL HIGH
Glucose-Capillary: 127 — ABNORMAL HIGH
Glucose-Capillary: 137 — ABNORMAL HIGH
Glucose-Capillary: 161 — ABNORMAL HIGH
Glucose-Capillary: 166 — ABNORMAL HIGH
Glucose-Capillary: 183 — ABNORMAL HIGH
Glucose-Capillary: 205 — ABNORMAL HIGH
Glucose-Capillary: 225 — ABNORMAL HIGH
Glucose-Capillary: 228 — ABNORMAL HIGH
Glucose-Capillary: 302 — ABNORMAL HIGH

## 2011-08-02 LAB — T3: T3, Total: 134.4 (ref 80.0–204.0)

## 2011-08-02 LAB — D-DIMER, QUANTITATIVE: D-Dimer, Quant: <0.22

## 2011-08-02 LAB — TROPONIN I
Troponin I: 0.02
Troponin I: 0.02

## 2011-08-02 LAB — TSH: TSH: 5.084 — ABNORMAL HIGH

## 2011-08-04 LAB — DIFFERENTIAL
Basophils Absolute: 0 10*3/uL (ref 0.0–0.1)
Eosinophils Relative: 4 % (ref 0–5)
Lymphocytes Relative: 25 % (ref 12–46)
Lymphs Abs: 2.4 10*3/uL (ref 0.7–4.0)
Neutro Abs: 6.3 10*3/uL (ref 1.7–7.7)
Neutrophils Relative %: 63 % (ref 43–77)

## 2011-08-04 LAB — POCT CARDIAC MARKERS
CKMB, poc: 1.4 ng/mL (ref 1.0–8.0)
CKMB, poc: <1 ng/mL — ABNORMAL LOW (ref 1.0–8.0)
Myoglobin, poc: 102 ng/mL (ref 12–200)
Myoglobin, poc: 95.2 ng/mL (ref 12–200)
Troponin i, poc: <0.05 ng/mL (ref 0.00–0.09)

## 2011-08-04 LAB — ETHANOL: Alcohol, Ethyl (B): 6 mg/dL (ref 0–10)

## 2011-08-04 LAB — POCT I-STAT, CHEM 8
BUN: 15 mg/dL (ref 6–23)
BUN: 20 mg/dL (ref 6–23)
Calcium, Ion: 1.05 mmol/L — ABNORMAL LOW (ref 1.12–1.32)
Chloride: 108 mEq/L (ref 96–112)
Creatinine, Ser: 1 mg/dL (ref 0.4–1.5)
Glucose, Bld: 177 mg/dL — ABNORMAL HIGH (ref 70–99)
HCT: 49 % (ref 39.0–52.0)
Hemoglobin: 16.7 g/dL (ref 13.0–17.0)
Potassium: 3.9 mEq/L (ref 3.5–5.1)
Potassium: 4 meq/L (ref 3.5–5.1)
Sodium: 140 mEq/L (ref 135–145)
Sodium: 141 mEq/L (ref 135–145)
TCO2: 23 mmol/L (ref 0–100)
TCO2: 28 mmol/L (ref 0–100)

## 2011-08-04 LAB — CBC
HCT: 43.5 % (ref 39.0–52.0)
HCT: 47.2 % (ref 39.0–52.0)
MCV: 86.3 fL (ref 78.0–100.0)
Platelets: 223 10*3/uL (ref 150–400)
Platelets: 254 10*3/uL (ref 150–400)
RDW: 13.2 % (ref 11.5–15.5)
RDW: 13.4 % (ref 11.5–15.5)
WBC: 9.9 10*3/uL (ref 4.0–10.5)

## 2011-08-04 LAB — RAPID URINE DRUG SCREEN, HOSP PERFORMED
Barbiturates: NOT DETECTED
Opiates: POSITIVE — AB
Tetrahydrocannabinol: NOT DETECTED

## 2011-08-04 LAB — URINALYSIS, ROUTINE W REFLEX MICROSCOPIC
Bilirubin Urine: NEGATIVE
Glucose, UA: NEGATIVE mg/dL
Ketones, ur: NEGATIVE mg/dL
pH: 5.5 (ref 5.0–8.0)

## 2011-08-04 LAB — URINE MICROSCOPIC-ADD ON

## 2018-10-07 ENCOUNTER — Encounter: Payer: Self-pay | Admitting: Pediatric Intensive Care

## 2018-10-07 DIAGNOSIS — Z794 Long term (current) use of insulin: Secondary | ICD-10-CM

## 2018-10-07 DIAGNOSIS — E1169 Type 2 diabetes mellitus with other specified complication: Secondary | ICD-10-CM

## 2018-10-07 LAB — GLUCOSE, POCT (MANUAL RESULT ENTRY): POC Glucose: 482 mg/dl — AB (ref 70–99)

## 2018-10-10 ENCOUNTER — Telehealth: Payer: Self-pay | Admitting: Pediatric Intensive Care

## 2018-10-10 ENCOUNTER — Telehealth: Payer: Self-pay

## 2018-10-10 NOTE — Telephone Encounter (Signed)
Called patient to schedule an appt with Dr. Delford FieldWright per [place]. LVM to return call and schedule appt for 12/13 with Dr. Delford FieldWright.

## 2018-10-10 NOTE — Telephone Encounter (Signed)
Call to client to advise him that he will walk in [facility]. Advised client to meet CN at CN clinic office at 0830 to receive taxi vouchers. Client agrees to plan.

## 2018-10-11 ENCOUNTER — Encounter (HOSPITAL_COMMUNITY): Payer: Self-pay

## 2018-10-11 ENCOUNTER — Encounter: Payer: Self-pay | Admitting: Pediatric Intensive Care

## 2018-10-11 ENCOUNTER — Other Ambulatory Visit: Payer: Self-pay

## 2018-10-11 ENCOUNTER — Emergency Department (HOSPITAL_COMMUNITY)
Admission: EM | Admit: 2018-10-11 | Discharge: 2018-10-11 | Disposition: A | Discharge status: Home / Self Care | Payer: Medicare Other | Attending: Emergency Medicine | Admitting: Emergency Medicine

## 2018-10-11 ENCOUNTER — Ambulatory Visit (HOSPITAL_COMMUNITY): Payer: Self-pay

## 2018-10-11 ENCOUNTER — Emergency Department (HOSPITAL_COMMUNITY): Payer: Medicare Other

## 2018-10-11 ENCOUNTER — Encounter: Payer: Self-pay | Admitting: Critical Care Medicine

## 2018-10-11 ENCOUNTER — Ambulatory Visit (HOSPITAL_BASED_OUTPATIENT_CLINIC_OR_DEPARTMENT_OTHER): Payer: Medicare Other | Admitting: Critical Care Medicine

## 2018-10-11 VITALS — BP 128/77 | HR 78 | Temp 98.2°F | Resp 18 | Ht 69.0 in | Wt 270.0 lb

## 2018-10-11 DIAGNOSIS — I2510 Atherosclerotic heart disease of native coronary artery without angina pectoris: Secondary | ICD-10-CM | POA: Diagnosis not present

## 2018-10-11 DIAGNOSIS — R0682 Tachypnea, not elsewhere classified: Secondary | ICD-10-CM | POA: Insufficient documentation

## 2018-10-11 DIAGNOSIS — Z7902 Long term (current) use of antithrombotics/antiplatelets: Secondary | ICD-10-CM | POA: Diagnosis not present

## 2018-10-11 DIAGNOSIS — I10 Essential (primary) hypertension: Secondary | ICD-10-CM

## 2018-10-11 DIAGNOSIS — E1169 Type 2 diabetes mellitus with other specified complication: Secondary | ICD-10-CM

## 2018-10-11 DIAGNOSIS — I252 Old myocardial infarction: Secondary | ICD-10-CM | POA: Insufficient documentation

## 2018-10-11 DIAGNOSIS — Z79899 Other long term (current) drug therapy: Secondary | ICD-10-CM | POA: Diagnosis not present

## 2018-10-11 DIAGNOSIS — E785 Hyperlipidemia, unspecified: Secondary | ICD-10-CM

## 2018-10-11 DIAGNOSIS — Z794 Long term (current) use of insulin: Secondary | ICD-10-CM | POA: Insufficient documentation

## 2018-10-11 DIAGNOSIS — F17210 Nicotine dependence, cigarettes, uncomplicated: Secondary | ICD-10-CM | POA: Diagnosis not present

## 2018-10-11 DIAGNOSIS — E119 Type 2 diabetes mellitus without complications: Secondary | ICD-10-CM | POA: Insufficient documentation

## 2018-10-11 DIAGNOSIS — R079 Chest pain, unspecified: Secondary | ICD-10-CM | POA: Diagnosis present

## 2018-10-11 DIAGNOSIS — R0789 Other chest pain: Secondary | ICD-10-CM | POA: Diagnosis not present

## 2018-10-11 DIAGNOSIS — I25110 Atherosclerotic heart disease of native coronary artery with unstable angina pectoris: Secondary | ICD-10-CM

## 2018-10-11 DIAGNOSIS — F319 Bipolar disorder, unspecified: Secondary | ICD-10-CM | POA: Insufficient documentation

## 2018-10-11 DIAGNOSIS — E1142 Type 2 diabetes mellitus with diabetic polyneuropathy: Secondary | ICD-10-CM

## 2018-10-11 DIAGNOSIS — G8929 Other chronic pain: Secondary | ICD-10-CM | POA: Insufficient documentation

## 2018-10-11 DIAGNOSIS — G894 Chronic pain syndrome: Secondary | ICD-10-CM

## 2018-10-11 HISTORY — DX: Bipolar disorder, unspecified: F31.9

## 2018-10-11 HISTORY — DX: Acute myocardial infarction, unspecified: I21.9

## 2018-10-11 LAB — CBC WITH DIFFERENTIAL/PLATELET
Abs Immature Granulocytes: 0.05 10*3/uL (ref 0.00–0.07)
Basophils Absolute: 0 10*3/uL (ref 0.0–0.1)
Basophils Relative: 0 %
Eosinophils Absolute: 0.2 10*3/uL (ref 0.0–0.5)
Eosinophils Relative: 2 %
HCT: 43.8 % (ref 39.0–52.0)
Hemoglobin: 14 g/dL (ref 13.0–17.0)
Immature Granulocytes: 0 %
Lymphocytes Relative: 17 %
Lymphs Abs: 2.2 10*3/uL (ref 0.7–4.0)
MCH: 27.9 pg (ref 26.0–34.0)
MCHC: 32 g/dL (ref 30.0–36.0)
MCV: 87.3 fL (ref 80.0–100.0)
Monocytes Absolute: 0.7 10*3/uL (ref 0.1–1.0)
Monocytes Relative: 5 %
NRBC: 0 % (ref 0.0–0.2)
Neutro Abs: 10 10*3/uL — ABNORMAL HIGH (ref 1.7–7.7)
Neutrophils Relative %: 76 %
Platelets: 199 10*3/uL (ref 150–400)
RBC: 5.02 MIL/uL (ref 4.22–5.81)
RDW: 13.6 % (ref 11.5–15.5)
WBC: 13.2 10*3/uL — ABNORMAL HIGH (ref 4.0–10.5)

## 2018-10-11 LAB — POCT GLYCOSYLATED HEMOGLOBIN (HGB A1C): HbA1c, POC (controlled diabetic range): 10.9 % — AB (ref 0.0–7.0)

## 2018-10-11 LAB — COMPREHENSIVE METABOLIC PANEL
ALT: 14 U/L (ref 0–44)
AST: 13 U/L — ABNORMAL LOW (ref 15–41)
Albumin: 3.1 g/dL — ABNORMAL LOW (ref 3.5–5.0)
Alkaline Phosphatase: 71 U/L (ref 38–126)
Anion gap: 14 (ref 5–15)
BUN: 14 mg/dL (ref 8–23)
CHLORIDE: 100 mmol/L (ref 98–111)
CO2: 23 mmol/L (ref 22–32)
Calcium: 8.5 mg/dL — ABNORMAL LOW (ref 8.9–10.3)
Creatinine, Ser: 1.1 mg/dL (ref 0.61–1.24)
GFR calc Af Amer: >60 mL/min (ref >60)
GFR calc non Af Amer: >60 mL/min (ref >60)
Glucose, Bld: 357 mg/dL — ABNORMAL HIGH (ref 70–99)
POTASSIUM: 4.1 mmol/L (ref 3.5–5.1)
Sodium: 137 mmol/L (ref 135–145)
Total Bilirubin: 0.5 mg/dL (ref 0.3–1.2)
Total Protein: 6.1 g/dL — ABNORMAL LOW (ref 6.5–8.1)

## 2018-10-11 LAB — PROTIME-INR
INR: 0.95
Prothrombin Time: 12.6 seconds (ref 11.4–15.2)

## 2018-10-11 LAB — GLUCOSE, POCT (MANUAL RESULT ENTRY): POC Glucose: 437 mg/dl — AB (ref 70–99)

## 2018-10-11 LAB — BRAIN NATRIURETIC PEPTIDE: B Natriuretic Peptide: 34.9 pg/mL (ref 0.0–100.0)

## 2018-10-11 LAB — TROPONIN I

## 2018-10-11 LAB — MAGNESIUM: Magnesium: 1.8 mg/dL (ref 1.7–2.4)

## 2018-10-11 MED ORDER — ALBUTEROL SULFATE HFA 108 (90 BASE) MCG/ACT IN AERS: 1.0000 | INHALATION_SPRAY | Freq: Four times a day (QID) | RESPIRATORY_TRACT | 0 refills | Status: DC | PRN

## 2018-10-11 MED ORDER — CLOPIDOGREL BISULFATE 75 MG PO TABS: 75.0000 mg | ORAL_TABLET | Freq: Every day | ORAL | 0 refills | Status: DC

## 2018-10-11 MED ORDER — GABAPENTIN 400 MG PO CAPS: 400.0000 mg | ORAL_CAPSULE | Freq: Three times a day (TID) | ORAL | 0 refills | Status: DC

## 2018-10-11 MED ORDER — OXYCODONE-ACETAMINOPHEN 10-325 MG PO TABS: 1.0000 | ORAL_TABLET | Freq: Three times a day (TID) | ORAL | 0 refills | Status: DC

## 2018-10-11 MED ORDER — LISINOPRIL 10 MG PO TABS: 10.0000 mg | ORAL_TABLET | Freq: Every day | ORAL | 0 refills | Status: DC

## 2018-10-11 MED ORDER — METFORMIN HCL 500 MG PO TABS: 500.0000 mg | ORAL_TABLET | Freq: Two times a day (BID) | ORAL | 0 refills | Status: DC

## 2018-10-11 MED ORDER — ALPRAZOLAM 2 MG PO TABS: 2.0000 mg | ORAL_TABLET | Freq: Three times a day (TID) | ORAL | 0 refills | Status: DC | PRN

## 2018-10-11 MED ORDER — FUROSEMIDE 20 MG PO TABS: 80.0000 mg | ORAL_TABLET | Freq: Every day | ORAL | 0 refills | Status: DC

## 2018-10-11 MED ORDER — FENTANYL CITRATE (PF) 100 MCG/2ML IJ SOLN
50.0000 ug | Freq: Once | INTRAMUSCULAR | Status: AC
  Administered 2018-10-11: 50 ug via INTRAVENOUS
  Filled 2018-10-11: qty 2

## 2018-10-11 MED ORDER — ASPIRIN 81 MG PO CHEW
324.0000 mg | CHEWABLE_TABLET | Freq: Once | ORAL | Status: DC
  Filled 2018-10-11: qty 4

## 2018-10-11 MED ORDER — INSULIN ASPART 100 UNIT/ML ~~LOC~~ SOLN: SUBCUTANEOUS | 0 refills | Status: DC

## 2018-10-11 MED FILL — FUROSEMIDE 20 MG TAB: 20 | 8 days supply | Qty: 30 | Fill #0

## 2018-10-11 MED FILL — GABAPENTIN 400 MG CAPSULE: 400 | 30 days supply | Qty: 90 | Fill #0

## 2018-10-11 MED FILL — NovoLOG 100 UNIT/ML SOLN: 100 | 10 days supply | Qty: 10 | Fill #0

## 2018-10-11 MED FILL — VENTOLIN HFA 90 MCG INHALER: 108 (90 BAS | 30 days supply | Qty: 18 | Fill #0

## 2018-10-11 MED FILL — ALPRAZolam 0.5 MG TABS: 0.5 | 7 days supply | Qty: 84 | Fill #0

## 2018-10-11 MED FILL — metFORMIN HCL 500 MG TABS: 500 | 30 days supply | Qty: 60 | Fill #0

## 2018-10-11 MED FILL — ULTICARE INS 0.5 ML 30GX1/2: 30G X 1/2" | 10 days supply | Qty: 30 | Fill #0

## 2018-10-11 MED FILL — OXYCODONE-APAP 10-325: 10-325 | 6 days supply | Qty: 20 | Fill #0

## 2018-10-11 MED FILL — LISINOPRIL 10 MG TABS: 10 | 30 days supply | Qty: 30 | Fill #0

## 2018-10-11 MED FILL — CLOPIDOGREL 75 MG TABLET: 75 | 30 days supply | Qty: 30 | Fill #0

## 2018-10-11 NOTE — Assessment & Plan Note (Signed)
Hyperlipidemia  Has been on statin therapy but currently off without access to medicines

## 2018-10-11 NOTE — Care Management Note (Signed)
Case Management Note  Patient Details  Name: Antonio PummelRobert Sparks MRN: [id number] Date of Birth: [birth year]  Subjective/Objective:     [facility],  [facility] is [facility].  The patient arrived [place] ago from [place] where he had been seen in a free clinic.  The patient has a longstanding history of COPD, coronary disease with previous stent plac[place] years ago, hypertension, bipolar disorder with significant anxiety, type 2 diabetes with hyperlipidemia, and chronic pain syndrome. Patient reports that his luggage was lost with his medication in transit. Patient has been out of meds since arriving in [place] 10 days ago.           Action/Plan: CM received consult for medication assistance. [facility] but will be closed over the weekend. CM contacted [facility], [facility]. Updated EDP and Ishmael HolterBrian RN. Medications were picked up at the and handed to patient. Patient made aware to [facility]. ED CM will update clinic's CM. CSW provided [facility]. No further ED CM needs identified.  Expected Discharge Date:   10/11/2018               Expected Discharge Plan:  Home/Self Care  In-House Referral:     Discharge planning Services  [facility], [facility]  Post Acute Care Choice:    Choice offered to:  Patient  DME Arranged:    DME Agency:     HH Arranged:    HH Agency:     Status of Service:   Home/ Self Care Homeless Shelter  If discussed at [employer] Length of Stay Meetings, dates discussed:    Additional CommentsMichel Bickers:  Antonio Savino, RN 10/11/2018, 5:53 PM

## 2018-10-11 NOTE — Congregational Nurse Program (Signed)
  Dept: [id number]   Congregational Nurse Program Note  Date of Encounter: 10/07/2018  Past Medical History: No past medical history on file.  Encounter Details: CNP Questionnaire - 10/07/18 0945      Questionnaire   Patient Status  Not Applicable    Race  White or Caucasian    Location Patient Served At  [facility]    Insurance  Medicare    Uninsured  Not Applicable    Food  Yes, have food insecurities;Within past 12 months, worried food would run out with no money to buy more    Housing/Utilities  No permanent housing    Transportation  Yes, need transportation assistance    Interpersonal Safety  Yes, feel physically and emotionally safe where you currently live    Medication  Yes, have medication insecurities    Medical Provider  No    Referrals  Primary Care Provider/Clinic;Emergency Department    ED Visit Averted  Not Applicable    Life-Saving Intervention Made  Not Applicable     New client encounter. Client states he came here via [place]. He states that his suitcase containing all of his medication (except Xanax and albuterol inhaler) went to another location. Client states that he has a history of diabetes, COPD, "irregular heartbeat" and swelling. He initially could not recall names of his medication but on review he knew that he was taking 60mg  lasix daily for "swelling". BBS- clear however. decreased air movement throughout. 3+ edema noted on lower extremities. CN advised ED or urgent care evaluation as client has no access to medication at present. Client states that he will go to [facility] clinic today as walk-in. Follow up in clinic on Friday. EarlingtonVictoria Hussey CNP [phone]

## 2018-10-11 NOTE — Assessment & Plan Note (Signed)
Coronary artery disease anatomy is unknown with previous stents now with ongoing chest pain without evidence of acute MI on EKG  This patient has been without all of his medicines for over 2 weeks as he recently moved here and his luggage was sent to another state  Plan EMS transfer to emergency room for acute evaluation of unstable angina

## 2018-10-11 NOTE — Assessment & Plan Note (Signed)
Type 2 diabetes poorly controlled with hemoglobin A1c of 11 blood sugar 440 without insulin recently  Will need  resumption of insulin   note and previously he was well controlled with NovoLog or Lantus

## 2018-10-11 NOTE — Progress Notes (Signed)
Subjective:    Patient ID: Antonio PummelRobert Brueggemann, male    DOB: August 20, 1951, 67 y.o.   MRN: [id number]  67 y.o. M    this patient is currently staying in a homeless shelter.  The [place] ago [place] he had been seen in a free clinic.  The patient has a longstanding history of COPD, coronary disease with previous stent plac[place] in [place] 4 years ago, hypertension, bipolar disorder with significant anxiety, type 2 diabetes with hyperlipidemia, and chronic pain syndrome from musculoskeletal deformities   The patient presents today with anginal type symptoms that radiate to the jaw and arm and are relieved with nitroglycerin.  These pains been coming on for the past several days.  See chest pain assessment below.    Unfortunately we do not have any records to document the patient's current coronary anatomy.  There is an echocardiogram from 2009 in the record which shows an ejection fraction at that time of 60% with normal LV function  The patient also has neuropathy in the lower extremities from uncontrolled diabetes    Chest Pain   This is a new (chest pain for two days, nitro helped) problem. The current episode started in the past 7 days. The onset quality is sudden. The problem occurs 2 to 4 times per day. The problem has been unchanged. The pain is present in the substernal region. The pain is at a severity of 8/10. The pain is severe. The quality of the pain is described as squeezing, tightness and heavy. The pain radiates to the left neck. Associated symptoms include back pain, diaphoresis, dizziness, exertional chest pressure, headaches, irregular heartbeat, leg pain, lower extremity edema, malaise/fatigue, orthopnea, palpitations and shortness of breath. He has tried nitroglycerin for the symptoms. The treatment provided significant relief. Risk factors include smoking/tobacco exposure, stress, lack of exercise and male gender.  His past medical history is  significant for anxiety/panic attacks, [name], COPD, diabetes and heart disease.  Diabetes  He presents for his initial diabetic visit. He has type 2 diabetes mellitus. His disease course has been worsening. Hypoglycemia symptoms include dizziness, headaches and nervousness/anxiousness. Associated symptoms include chest pain, fatigue, foot paresthesias, polydipsia, polyphagia and polyuria. Symptoms are worsening. Diabetic complications include heart disease and peripheral neuropathy. Risk factors for coronary artery disease include diabetes mellitus, hypertension, dyslipidemia and male sex. He is compliant with treatment none of the time. He is following a generally unhealthy diet. He has not had a previous visit with a dietitian. He rarely participates in exercise.    History reviewed. No pertinent past medical history.   History reviewed. No pertinent family history.   Social History   Socioeconomic History  . Marital status: Divorced    Spouse name: Not on file  . Number of children: Not on file  . Years of education: Not on file  . Highest education level: Not on file  Occupational History  . Not on file  Social Needs  . Financial resource strain: Not on file  . Food insecurity:    Worry: Not on file    Inability: Not on file  . Transportation needs:    Medical: Not on file    Non-medical: Not on file  Tobacco Use  . Smoking status: Current Every Day Smoker    Packs/day: 0.25    Types: Cigarettes  . Smokeless tobacco: Never Used  Substance and Sexual Activity  . Alcohol use: Not Currently  . Drug use: Not Currently  .  Sexual activity: Not Currently  Lifestyle  . Physical activity:    Days per week: Not on file    Minutes per session: Not on file  . Stress: Not on file  Relationships  . Social connections:    Talks on phone: Not on file    Gets together: Not on file    Attends religious service: Not on file    Active member of club or organization: Not on file     Attends meetings of clubs or organizations: Not on file    Relationship status: Not on file  . Intimate partner violence:    Fear of current or ex partner: Not on file    Emotionally abused: Not on file    Physically abused: Not on file    Forced sexual activity: Not on file  Other Topics Concern  . Not on file  Social History Narrative  . Not on file     Allergies  Allergen Reactions  . Bee Venom Anaphylaxis  . Codeine Nausea And Vomiting and Rash  . Iodine Nausea And Vomiting     Outpatient Medications Prior to Visit  Medication Sig Dispense Refill  . albuterol (PROVENTIL HFA;VENTOLIN HFA) 108 (90 Base) MCG/ACT inhaler Inhale into the lungs every 6 (six) hours as needed for wheezing or shortness of breath.    . alprazolam (XANAX) 2 MG tablet Take 2 mg by mouth 3 (three) times daily.     . clopidogrel (PLAVIX) 75 MG tablet Take 75 mg by mouth daily.    Marland Kitchen EPINEPHrine (EPIPEN 2-PAK) 0.3 mg/0.3 mL IJ SOAJ injection Inject into the muscle once.    . furosemide (LASIX) 20 MG tablet Take 80 mg by mouth 4 (four) times daily.    Marland Kitchen gabapentin (NEURONTIN) 400 MG capsule Take 400 mg by mouth 3 (three) times daily.    . insulin aspart (NOVOLOG) 100 UNIT/ML injection Inject into the skin. 45 units AM and 50 units PM    . lisinopril (PRINIVIL,ZESTRIL) 10 MG tablet Take 10 mg by mouth daily.    . metFORMIN (GLUCOPHAGE) 500 MG tablet Take by mouth 2 (two) times daily with a meal.    . oxyCODONE-acetaminophen (PERCOCET) 10-325 MG tablet Take 1 tablet by mouth 3 (three) times daily.     No facility-administered medications prior to visit.      Review of Systems  Constitutional: Positive for diaphoresis, fatigue and malaise/fatigue.  Respiratory: Positive for shortness of breath.   Cardiovascular: Positive for chest pain, palpitations and orthopnea.  Endocrine: Positive for polydipsia, polyphagia and polyuria.  Musculoskeletal: Positive for back pain.  Neurological: Positive for dizziness  and headaches.  Psychiatric/Behavioral: The patient is nervous/anxious.        Objective:   Physical Exam Vitals:   10/11/18 0933  BP: 128/77  Pulse: 78  Resp: 18  Temp: 98.2 F (36.8 C)  TempSrc: Oral  SpO2: 100%  Weight: 270 lb (122.5 kg)  Height: 5\' 9"  (1.753 m)    Gen: Pleasant, obese, in no distress,  normal affect  ENT: No lesions,  mouth clear,  oropharynx clear, no postnasal drip  Neck: No JVD, no TMG, no carotid bruits  Lungs: No use of accessory muscles, no dullness to percussion, distant BS  Cardiovascular: RRR, heart sounds normal, no murmur or gallops, no peripheral edema  Abdomen: soft and NT, no HSM,  BS normal  Musculoskeletal: No deformities, no cyanosis or clubbing  Neuro: alert, non focal  Skin: Warm, no lesions or rashes  No results found.   ECG: NSR, septal inferior infarct age indeterminate CBG 447  HgA1C 11     Assessment & Plan:  I personally reviewed all images and lab data in the [facility] system as well as any outside material available during this office visit and agree with the  radiology impressions.   [name] (coronary artery disease) Coronary artery disease anatomy is unknown with previous stents now with ongoing chest pain without evidence of acute MI on EKG  This patient has been without all of his medicines for over 2 weeks as he recently moved here and his luggage was sent to another state  Plan EMS transfer to emergency room for acute evaluation of unstable angina  HTN (hypertension) History of hypertension currently not on medication and blood pressure is actually low as the patient's been ongoing taking Lasix  Hyperlipidemia associated with type 2 diabetes mellitus (HCC) Hyperlipidemia  Has been on statin therapy but currently off without access to medicines  DM2 (diabetes mellitus, type 2) (HCC) Type 2 diabetes poorly controlled with hemoglobin A1c of 11 blood sugar 440 without insulin recently  Will need  resumption  of insulin   note and previously he was well controlled with NovoLog or Lantus  Bipolar 1 disorder (HCC) Bipolar disorder not on active treatment except for benzodiazepines  Chronic pain Chronic pain with chronic use of Percocet  Secondary musculoskeletal conditions   Rylan was seen today for medication management.  Diagnoses and all orders for this visit:  Type 2 diabetes mellitus with other specified complication, unspecified whether long term insulin use (HCC) -     Glucose (CBG) -     HgB A1c  Coronary artery disease involving native heart with unstable angina pectoris, unspecified vessel or lesion type [facility])  Essential hypertension  Hyperlipidemia associated with type 2 diabetes mellitus (HCC)  Type 2 diabetes mellitus with diabetic polyneuropathy, with long-term current use of insulin (HCC)  Bipolar 1 disorder (HCC)  Chronic pain syndrome

## 2018-10-11 NOTE — ED Notes (Signed)
Pt went to the restroom and was found to be outside of restroom stating "he is dizzy." Pt fell when attempting to move from wheelchair to bed. Pt alert and oriented. MD to bedside. No additional testing needed per MD. Criss AlvineAOX4 at his baseline.

## 2018-10-11 NOTE — ED Triage Notes (Signed)
[facility]- pt coming from [place] [facility] with c/o CP X3 days. Describes as heaviness. Hx of MI. Pt had 324 of aspirin and 1 nitro with minimal relief.

## 2018-10-11 NOTE — Assessment & Plan Note (Signed)
History of hypertension currently not on medication and blood pressure is actually low as the patient's been ongoing taking Lasix

## 2018-10-11 NOTE — Assessment & Plan Note (Signed)
Chronic pain with chronic use of Percocet  Secondary musculoskeletal conditions

## 2018-10-11 NOTE — ED Provider Notes (Signed)
[facility] EMERGENCY [facility]PART[facility] Provider Note   CSN: [id number] Arrival date & time: 10/11/18  1059     History   Chief Complaint Chief Complaint  Patient presents with  . Chest Pain    HPI Antonio Sparks is a 67 y.o. male.  HPI Patient presents with multiple complaints. Patient notes multiple medical issues, and no access to medicine as it was apparently lost during transport. Patient recently relocated here from [place]. He notes that  The pertinent to today's emergency department visit he has developed chest pain. This began 3 days ago, since onset is been persistent, described as heaviness, throughout the superior anterior thorax. Associated dyspnea, though not pleuritic or exertional necessarily. Minimal relief with aspirin and nitroglycerin. No fever, no cough. Patient went to urgent care, and was sent here for further evaluation and management.  Past Medical History:  Diagnosis Date  . Bipolar disorder (HCC)   . Myocardial infarct [facility](HCC)     Patient Active Problem List   Diagnosis Date Noted  . [name] (coronary artery disease) 10/11/2018  . HTN (hypertension) 10/11/2018  . Hyperlipidemia associated with type 2 diabetes mellitus (HCC) 10/11/2018  . DM2 (diabetes mellitus, type 2) (HCC) 10/11/2018  . Bipolar 1 disorder (HCC) 10/11/2018  . Chronic pain 10/11/2018    History reviewed. No pertinent surgical history.      Home Medications    Prior to Admission medications   Medication Sig Start Date End Date Taking? Authorizing Provider  albuterol (PROVENTIL HFA;VENTOLIN HFA) 108 (90 Base) MCG/ACT inhaler Inhale 1 puff into the lungs every 6 (six) hours as needed for wheezing or shortness of breath. 10/11/18   Gerhard MunchLockwood, Zae, MD  alprazolam Prudy Feeler(XANAX) 2 MG tablet Take 1 tablet (2 mg total) by mouth 3 (three) times daily as needed for anxiety. 10/11/18   Gerhard MunchLockwood, Ugonna, MD  clopidogrel (PLAVIX) 75 MG tablet Take 1 tablet (75 mg total) by  mouth daily. 10/11/18   Gerhard MunchLockwood, Juanmanuel, MD  furosemide (LASIX) 20 MG tablet Take 4 tablets (80 mg total) by mouth daily. 10/11/18   Gerhard MunchLockwood, Achille, MD  gabapentin (NEURONTIN) 400 MG capsule Take 1 capsule (400 mg total) by mouth 3 (three) times daily. 10/11/18   Gerhard MunchLockwood, Per, MD  insulin aspart (NOVOLOG) 100 UNIT/ML injection 45 units AM and 50 units PM 10/11/18   Gerhard MunchLockwood, Kaspian, MD  lisinopril (PRINIVIL,ZESTRIL) 10 MG tablet Take 1 tablet (10 mg total) by mouth daily. 10/11/18   Gerhard MunchLockwood, Kamaury, MD  metFORMIN (GLUCOPHAGE) 500 MG tablet Take 1 tablet (500 mg total) by mouth 2 (two) times daily with a meal. 10/11/18   Gerhard MunchLockwood, Phi, MD  oxyCODONE-acetaminophen (PERCOCET) 10-325 MG tablet Take 1 tablet by mouth 3 (three) times daily. 10/11/18   Gerhard MunchLockwood, Aloysuis, MD    Family History History reviewed. No pertinent family history.  Social History Social History   Tobacco Use  . Smoking status: Current Every Day Smoker    Packs/day: 0.25    Types: Cigarettes  . Smokeless tobacco: Never Used  Substance Use Topics  . Alcohol use: Not Currently  . Drug use: Not Currently     Allergies   Bee venom; Codeine; and Iodine   Review of Systems Review of Systems  Constitutional:       Per HPI, otherwise negative  HENT:       Per HPI, otherwise negative  Respiratory:       Per HPI, otherwise negative  Cardiovascular:       Per HPI, otherwise negative  Gastrointestinal: Negative for vomiting.  Endocrine:       Negative aside from HPI  Genitourinary:       Neg aside from HPI   Musculoskeletal:       Per HPI, otherwise negative  Skin: Negative.   Neurological: Negative for syncope.  Psychiatric/Behavioral: The patient is nervous/anxious.      Physical Exam Updated Vital Signs BP (!) 146/89   Pulse 80   Temp 98.2 F (36.8 C) (Oral)   Resp 20   SpO2 94%   Physical Exam Vitals signs and nursing note reviewed.  Constitutional:      General: He is not in acute  distress.    Appearance: He is well-developed.     Comments: Anxious male awake and alert answering questions briefly, but appropriately  HENT:     Head: Normocephalic and atraumatic.  Eyes:     Conjunctiva/sclera: Conjunctivae normal.  Cardiovascular:     Rate and Rhythm: Normal rate and regular rhythm.  Pulmonary:     Effort: Tachypnea present. No respiratory distress.     Breath sounds: No stridor.  Abdominal:     General: There is no distension.  Skin:    General: Skin is warm and dry.  Neurological:     Mental Status: He is alert and oriented to person, place, and time.      ED Treatments / Results  Labs (all labs ordered are listed, but only abnormal results are displayed) Labs Reviewed  COMPREHENSIVE METABOLIC PANEL - Abnormal; Notable for the following components:      Result Value   Glucose, Bld 357 (*)    Calcium 8.5 (*)    Total Protein 6.1 (*)    Albumin 3.1 (*)    AST 13 (*)    All other components within normal limits  CBC WITH DIFFERENTIAL/PLATELET - Abnormal; Notable for the following components:   WBC 13.2 (*)    Neutro Abs 10.0 (*)    All other components within normal limits  MAGNESIUM  TROPONIN I  BRAIN NATRIURETIC PEPTIDE  PROTIME-INR  CBG MONITORING, ED    EKG EKG Interpretation  Date/Time:  Friday October 11 2018 11:01:20 EST Ventricular Rate:  93 PR Interval:    QRS Duration: 130 QT Interval:  369 QTC Calculation: 459 R Axis:   5 Text Interpretation:  Sinus rhythm Right bundle branch block ST-t wave abnormality Abnormal ekg Confirmed by Gerhard Munch [id number]) on 10/11/2018 11:04:00 AM   Radiology Dg Chest Portable 1 View  Result Date: 10/11/2018 CLINICAL DATA:  Chest pain. EXAM: PORTABLE CHEST 1 VIEW COMPARISON:  Radiographs of October 23, 2008. FINDINGS: The heart size and mediastinal contours are within normal limits. Both lungs are clear. No pneumothorax or pleural effusion is noted. The visualized skeletal structures are  unremarkable. IMPRESSION: No active disease. Electronically Signed   By: Lupita Raider, M.D.   On: 10/11/2018 11:21    Procedures Procedures (including critical care time)  Medications Ordered in ED Medications  aspirin chewable tablet 324 mg (324 mg Oral Not Given 10/11/18 1251)  fentaNYL (SUBLIMAZE) injection 50 mcg (50 mcg Intravenous Given 10/11/18 1250)     Initial Impression / Assessment and Plan / ED Course  I have reviewed the triage vital signs and the nursing notes.  Pertinent labs & imaging results that were available during my care of the patient were reviewed by me and considered in my medical decision making (see chart for details).     1:18 PM En route  back from the bathroom the patient cast himself up on the floor. He did not have a fall. He had no new complaints, but continues to complain of some ongoing chest pain. 4:17 PM Patient in no distress, resting in right lateral decubitus position. Patient has been calm for hours of monitoring, as normal lab results, essentially. Normal troponin, nonischemic EKG, though he has pain for several days, there is low suspicion for atypical ACS. Patient possibly had a fall, though this seems largely psychogenic. With no ongoing complaints, no hemodynamic stability, I discussed findings with him, we discussed importance of outpatient follow-up. Given his social status, I discussed his case with our case management team, we facilitated obtaining all medications Patient will be discharged in stable condition.  Final Clinical Impressions(s) / ED Diagnoses   Final diagnoses:  Atypical chest pain    ED Discharge Orders         Ordered    albuterol (PROVENTIL HFA;VENTOLIN HFA) 108 (90 Base) MCG/ACT inhaler  Every 6 hours PRN     10/11/18 1604    alprazolam (XANAX) 2 MG tablet  3 times daily PRN     10/11/18 1604    clopidogrel (PLAVIX) 75 MG tablet  Daily     10/11/18 1604    furosemide (LASIX) 20 MG tablet  Daily      10/11/18 1604    gabapentin (NEURONTIN) 400 MG capsule  3 times daily     10/11/18 1604    insulin aspart (NOVOLOG) 100 UNIT/ML injection     10/11/18 1604    lisinopril (PRINIVIL,ZESTRIL) 10 MG tablet  Daily     10/11/18 1604    metFORMIN (GLUCOPHAGE) 500 MG tablet  2 times daily with meals     10/11/18 1604    oxyCODONE-acetaminophen (PERCOCET) 10-325 MG tablet  3 times daily     10/11/18 1604           Gerhard Munch, MD 10/11/18 1617

## 2018-10-11 NOTE — Assessment & Plan Note (Signed)
Bipolar disorder not on active treatment except for benzodiazepines

## 2018-10-11 NOTE — Discharge Instructions (Signed)
As discussed, your evaluation today has been largely reassuring.  But, it is important that you monitor your condition carefully, and do not hesitate to return to the ED if you develop new, or concerning changes in your condition. ? ?Otherwise, please follow-up with your physician for appropriate ongoing care. ? ?

## 2018-10-14 NOTE — Congregational Nurse Program (Signed)
Taxi vouchers with instructions for PCP appointment this morning at [facility]. Follow up in CN clinic next week.

## 2018-10-18 ENCOUNTER — Ambulatory Visit: Payer: Medicare Other | Attending: Critical Care Medicine | Admitting: Critical Care Medicine

## 2018-10-18 ENCOUNTER — Encounter: Payer: Self-pay | Admitting: Critical Care Medicine

## 2018-10-18 VITALS — BP 125/71 | HR 96 | Temp 98.6°F | Resp 16 | Wt 271.0 lb

## 2018-10-18 DIAGNOSIS — F329 Major depressive disorder, single episode, unspecified: Secondary | ICD-10-CM

## 2018-10-18 DIAGNOSIS — Z7902 Long term (current) use of antithrombotics/antiplatelets: Secondary | ICD-10-CM | POA: Insufficient documentation

## 2018-10-18 DIAGNOSIS — Z888 Allergy status to other drugs, medicaments and biological substances status: Secondary | ICD-10-CM | POA: Diagnosis not present

## 2018-10-18 DIAGNOSIS — I252 Old myocardial infarction: Secondary | ICD-10-CM | POA: Diagnosis not present

## 2018-10-18 DIAGNOSIS — E1142 Type 2 diabetes mellitus with diabetic polyneuropathy: Secondary | ICD-10-CM | POA: Insufficient documentation

## 2018-10-18 DIAGNOSIS — Z794 Long term (current) use of insulin: Secondary | ICD-10-CM

## 2018-10-18 DIAGNOSIS — G894 Chronic pain syndrome: Secondary | ICD-10-CM

## 2018-10-18 DIAGNOSIS — E1140 Type 2 diabetes mellitus with diabetic neuropathy, unspecified: Secondary | ICD-10-CM | POA: Diagnosis not present

## 2018-10-18 DIAGNOSIS — J206 Acute bronchitis due to rhinovirus: Secondary | ICD-10-CM | POA: Diagnosis not present

## 2018-10-18 DIAGNOSIS — I25110 Atherosclerotic heart disease of native coronary artery with unstable angina pectoris: Secondary | ICD-10-CM | POA: Diagnosis not present

## 2018-10-18 DIAGNOSIS — Z91030 Bee allergy status: Secondary | ICD-10-CM | POA: Diagnosis not present

## 2018-10-18 DIAGNOSIS — F32A Depression, unspecified: Secondary | ICD-10-CM | POA: Insufficient documentation

## 2018-10-18 DIAGNOSIS — F17210 Nicotine dependence, cigarettes, uncomplicated: Secondary | ICD-10-CM | POA: Insufficient documentation

## 2018-10-18 DIAGNOSIS — Z885 Allergy status to narcotic agent status: Secondary | ICD-10-CM | POA: Insufficient documentation

## 2018-10-18 DIAGNOSIS — Z79899 Other long term (current) drug therapy: Secondary | ICD-10-CM | POA: Insufficient documentation

## 2018-10-18 DIAGNOSIS — F319 Bipolar disorder, unspecified: Secondary | ICD-10-CM | POA: Insufficient documentation

## 2018-10-18 DIAGNOSIS — IMO0002 Reserved for concepts with insufficient information to code with codable children: Secondary | ICD-10-CM | POA: Insufficient documentation

## 2018-10-18 DIAGNOSIS — E1165 Type 2 diabetes mellitus with hyperglycemia: Secondary | ICD-10-CM | POA: Insufficient documentation

## 2018-10-18 DIAGNOSIS — Z79891 Long term (current) use of opiate analgesic: Secondary | ICD-10-CM | POA: Diagnosis not present

## 2018-10-18 DIAGNOSIS — F419 Anxiety disorder, unspecified: Secondary | ICD-10-CM | POA: Diagnosis not present

## 2018-10-18 DIAGNOSIS — E1169 Type 2 diabetes mellitus with other specified complication: Secondary | ICD-10-CM

## 2018-10-18 DIAGNOSIS — J440 Chronic obstructive pulmonary disease with acute lower respiratory infection: Secondary | ICD-10-CM | POA: Insufficient documentation

## 2018-10-18 DIAGNOSIS — E785 Hyperlipidemia, unspecified: Secondary | ICD-10-CM | POA: Insufficient documentation

## 2018-10-18 DIAGNOSIS — I10 Essential (primary) hypertension: Secondary | ICD-10-CM | POA: Insufficient documentation

## 2018-10-18 LAB — GLUCOSE, POCT (MANUAL RESULT ENTRY)
POC Glucose: 489 mg/dl — AB (ref 70–99)
POC Glucose: 409 mg/dl — AB (ref 70–99)
POC Glucose: 384 mg/dl — AB (ref 70–99)

## 2018-10-18 MED ORDER — ALBUTEROL SULFATE HFA 108 (90 BASE) MCG/ACT IN AERS: 1.0000 | INHALATION_SPRAY | Freq: Four times a day (QID) | RESPIRATORY_TRACT | 0 refills | Status: DC | PRN

## 2018-10-18 MED ORDER — INSULIN ASPART 100 UNIT/ML FLEXPEN: PEN_INJECTOR | SUBCUTANEOUS | 6 refills | Status: DC

## 2018-10-18 MED ORDER — CLOPIDOGREL BISULFATE 75 MG PO TABS: 75.0000 mg | ORAL_TABLET | Freq: Every day | ORAL | 0 refills | Status: DC

## 2018-10-18 MED ORDER — ALPRAZOLAM 2 MG PO TABS: 2.0000 mg | ORAL_TABLET | Freq: Three times a day (TID) | ORAL | 1 refills | Status: DC | PRN

## 2018-10-18 MED ORDER — LISINOPRIL 10 MG PO TABS: 10.0000 mg | ORAL_TABLET | Freq: Every day | ORAL | 0 refills | Status: DC

## 2018-10-18 MED ORDER — INSULIN ASPART 100 UNIT/ML ~~LOC~~ SOLN
15.0000 [IU] | Freq: Once | SUBCUTANEOUS | Status: AC
  Administered 2018-10-18: 15 [IU] via SUBCUTANEOUS

## 2018-10-18 MED ORDER — TRAZODONE HCL 100 MG PO TABS: 100.0000 mg | ORAL_TABLET | Freq: Every day | ORAL | 6 refills | Status: DC

## 2018-10-18 MED ORDER — METFORMIN HCL 500 MG PO TABS: 500.0000 mg | ORAL_TABLET | Freq: Two times a day (BID) | ORAL | 0 refills | Status: DC

## 2018-10-18 MED ORDER — FUROSEMIDE 40 MG PO TABS: 40.0000 mg | ORAL_TABLET | Freq: Two times a day (BID) | ORAL | 6 refills | Status: DC

## 2018-10-18 MED ORDER — PREGABALIN 75 MG PO CAPS: 75.0000 mg | ORAL_CAPSULE | Freq: Two times a day (BID) | ORAL | 6 refills | Status: DC

## 2018-10-18 MED ORDER — DOXYCYCLINE HYCLATE 100 MG PO TABS: 100.0000 mg | ORAL_TABLET | Freq: Two times a day (BID) | ORAL | 0 refills | Status: DC

## 2018-10-18 MED FILL — DOXYCYCLINE HYCLATE 100 MG: 100 | 5 days supply | Qty: 10 | Fill #0

## 2018-10-18 MED FILL — FUROSEMIDE 40 MG TAB: 40 | 30 days supply | Qty: 60 | Fill #0

## 2018-10-18 MED FILL — NOVOLOG FLEXPEN SYRINGE: 100 | 12 days supply | Qty: 12 | Fill #0

## 2018-10-18 NOTE — Assessment & Plan Note (Signed)
Bipolar disorder aggravated by current social circumstances Plan We did refill the Xanax and trazodone We will not prescribe Seroquel at this time We will connect the patient to our licensed clinical social worker for further connections to behavioral health provider

## 2018-10-18 NOTE — Assessment & Plan Note (Signed)
The patient has chronic pain on the basis of chronic diabetic neuropathy Plan We will prescribe Lyrica 75 mg twice daily and discontinue gabapentin We will not prescribed opioids at this time Review of [place] website does not show any other recent prescriptions other than the Xanax 21 tablets that was given at the emergency room visit on 10/11/2018

## 2018-10-18 NOTE — Assessment & Plan Note (Addendum)
Anxiety and depression with an elevated depression score, No suicide risk  Plan Medication adjustment with trazodone and Xanax given Follow-up with licensed clinical social work

## 2018-10-18 NOTE — Progress Notes (Addendum)
Subjective:    Patient ID: Antonio Sparks, male    DOB: 04-Jan-1951, 67 y.o.   MRN: [id number]  67 y.o. M    this patient is currently staying in a homeless shelter.  The [place] ago [place] he had been seen in a free clinic.  The patient has a longstanding history of COPD, coronary disease with previous stent placements in [place] 4 years ago, hypertension, bipolar disorder with significant anxiety, type 2 diabetes with hyperlipidemia, and chronic pain syndrome from musculoskeletal deformities  The patient also has neuropathy in the lower extremities from uncontrolled diabetes  10/18/2018 at the last visit the patient presented with anginal symptoms, and due to lack of pre-existing database we sent the patient to the emergency room the same day.  Evaluation there showed no evidence of acute coronary syndrome with negative troponins.  The patient did receive refills on his diabetic and hypertensive medications.  The patient presents today for post emergency room follow-up.  The patient now notes a productive cough of thick yellow mucus.  The patient is still actively smoking.  Patient states he is attempting to get back into the cardiology clinic at [facility].  He continues to live in a homeless shelter.   Chest Pain   This is a chronic (chest pain for two days, nitro helped) problem. The current episode started in the past 7 days. The onset quality is sudden. The problem occurs 2 to 4 times per day. The pain is present in the substernal region. The pain is at a severity of 8/10. The pain is mild. The quality of the pain is described as tightness. Associated symptoms include back pain, a cough, exertional chest pressure, headaches, irregular heartbeat, leg pain, lower extremity edema, malaise/fatigue, orthopnea, palpitations and shortness of breath. Pertinent negatives include no diaphoresis, dizziness or fever. He has tried nitroglycerin for the  symptoms. The treatment provided significant relief. Risk factors include smoking/tobacco exposure, stress, lack of exercise and male gender.  His past medical history is significant for anxiety/panic attacks, [name], COPD, diabetes and heart disease.  Diabetes  He presents for his initial diabetic visit. He has type 2 diabetes mellitus. His disease course has been worsening. Hypoglycemia symptoms include headaches and nervousness/anxiousness. Pertinent negatives for hypoglycemia include no dizziness or sweats. Associated symptoms include chest pain, fatigue, foot paresthesias, polydipsia, polyphagia and polyuria. Symptoms are worsening. Diabetic complications include heart disease and peripheral neuropathy. Risk factors for coronary artery disease include diabetes mellitus, hypertension, dyslipidemia and male sex. He is compliant with treatment none of the time. He is following a generally unhealthy diet. He has not had a previous visit with a dietitian. He rarely participates in exercise.  Cough  This is a new problem. The current episode started yesterday. The problem has been unchanged. The cough is productive of purulent sputum. Associated symptoms include chest pain, headaches and shortness of breath. Pertinent negatives include no chills, ear congestion, ear pain, fever, sore throat, sweats or wheezing. Nothing aggravates the symptoms. His past medical history is significant for bronchitis and COPD.    Past Medical History:  Diagnosis Date  . Bipolar disorder (HCC)   . Myocardial infarct [facility](HCC)      No family history on file.   Social History   Socioeconomic History  . Marital status: Divorced    Spouse name: Not on file  . Number of children: Not on file  . Years of education: Not on file  . Highest education  level: Not on file  Occupational History  . Not on file  Social Needs  . Financial resource strain: Not on file  . Food insecurity:    Worry: Not on file    Inability: Not on  file  . Transportation needs:    Medical: Not on file    Non-medical: Not on file  Tobacco Use  . Smoking status: Current Every Day Smoker    Packs/day: 0.25    Types: Cigarettes  . Smokeless tobacco: Never Used  Substance and Sexual Activity  . Alcohol use: Not Currently  . Drug use: Not Currently  . Sexual activity: Not Currently  Lifestyle  . Physical activity:    Days per week: Not on file    Minutes per session: Not on file  . Stress: Not on file  Relationships  . Social connections:    Talks on phone: Not on file    Gets together: Not on file    Attends religious service: Not on file    Active member of club or organization: Not on file    Attends meetings of clubs or organizations: Not on file    Relationship status: Not on file  . Intimate partner violence:    Fear of current or ex partner: Not on file    Emotionally abused: Not on file    Physically abused: Not on file    Forced sexual activity: Not on file  Other Topics Concern  . Not on file  Social History Narrative  . Not on file     Allergies  Allergen Reactions  . Bee Venom Anaphylaxis  . Codeine Nausea And Vomiting and Rash  . Iodine Nausea And Vomiting     Outpatient Medications Prior to Visit  Medication Sig Dispense Refill  . oxyCODONE-acetaminophen (PERCOCET) 10-325 MG tablet Take 1 tablet by mouth 3 (three) times daily. 20 tablet 0  . albuterol (PROVENTIL HFA;VENTOLIN HFA) 108 (90 Base) MCG/ACT inhaler Inhale 1 puff into the lungs every 6 (six) hours as needed for wheezing or shortness of breath. 1 Inhaler 0  . alprazolam (XANAX) 2 MG tablet Take 1 tablet (2 mg total) by mouth 3 (three) times daily as needed for anxiety. 21 tablet 0  . clopidogrel (PLAVIX) 75 MG tablet Take 1 tablet (75 mg total) by mouth daily. 30 tablet 0  . furosemide (LASIX) 20 MG tablet Take 4 tablets (80 mg total) by mouth daily. 30 tablet 0  . gabapentin (NEURONTIN) 400 MG capsule Take 1 capsule (400 mg total) by mouth 3  (three) times daily. 90 capsule 0  . insulin aspart (NOVOLOG) 100 UNIT/ML injection 45 units AM and 50 units PM 10 mL 0  . lisinopril (PRINIVIL,ZESTRIL) 10 MG tablet Take 1 tablet (10 mg total) by mouth daily. 30 tablet 0  . metFORMIN (GLUCOPHAGE) 500 MG tablet Take 1 tablet (500 mg total) by mouth 2 (two) times daily with a meal. 60 tablet 0  . insulin aspart (NOVOLOG FLEXPEN) 100 UNIT/ML FlexPen Inject 45-50 Units into the skin See admin instructions. Inject 45 units into the skin before breakfast and 50 units at bedtime     No facility-administered medications prior to visit.      Review of Systems  Constitutional: Positive for fatigue and malaise/fatigue. Negative for chills, diaphoresis and fever.  HENT: Negative for ear pain and sore throat.   Respiratory: Positive for cough and shortness of breath. Negative for wheezing.   Cardiovascular: Positive for chest pain, palpitations and orthopnea.  Endocrine: Positive for polydipsia, polyphagia and polyuria.  Musculoskeletal: Positive for back pain.  Neurological: Positive for headaches. Negative for dizziness.  Psychiatric/Behavioral: The patient is nervous/anxious.        Objective:   Physical Exam Vitals:   10/18/18 0908  BP: 125/71  Pulse: 96  Resp: 16  Temp: 98.6 F (37 C)  TempSrc: Oral  SpO2: 95%  Weight: 271 lb (122.9 kg)    Gen: Pleasant, obese, in no distress,  normal affect  ENT: No lesions,  mouth clear,  oropharynx clear, no postnasal drip  Neck: No JVD, no TMG, no carotid bruits  Lungs: No use of accessory muscles, no dullness to percussion, distant BS, scattered rhonchi  Cardiovascular: RRR, heart sounds normal, no murmur or gallops, no peripheral edema  Abdomen: soft and NT, no HSM,  BS normal  Musculoskeletal: No deformities, no cyanosis or clubbing  Neuro: alert, non focal  Skin: Warm, no lesions or rashes  No results found.     [facility] Visit [facility] 10/18/2018 in [facility]  PHQ-9 Total Score  14      HgA1C 11 BMP Latest Ref Rng & Units 10/11/2018 10/27/2008 10/23/2008  Glucose 70 - 99 mg/dL 161(W) 960(A) 540(J)  BUN 8 - 23 mg/dL 14 20 15   Creatinine 0.61 - 1.24 mg/dL 8.11 1.0 1.0  Sodium 914 - 145 mmol/L 137 140 141  Potassium 3.5 - 5.1 mmol/L 4.1 4.0 3.9  Chloride 98 - 111 mmol/L 100 108 104  CO2 22 - 32 mmol/L 23 - -  Calcium 8.9 - 10.3 mg/dL 7.8(G) - -   CBC Latest Ref Rng & Units 10/11/2018 10/27/2008 10/27/2008  WBC 4.0 - 10.5 K/uL 13.2(H) - 11.6(H)  Hemoglobin 13.0 - 17.0 g/dL 95.6 21.3 08.6  Hematocrit 39.0 - 52.0 % 43.8 49.0 47.2  Platelets 150 - 400 K/uL 199 - 254   CBG (last 3)  489>>>384  Assessment & Plan:  I personally reviewed all images and lab data in the [facility] system as well as any outside material available during this office visit and agree with the  radiology impressions.   [name] (coronary artery disease) History of coronary artery disease however current anatomy is not clear as we do not have old records  Recent chest pain syndrome that resulted in emergency room visit has now resolved  The evaluation in the emergency room did not reveal ongoing ischemia or acute coronary syndrome  We are suspecting stress related factors contributed to the patient's angina  HTN (hypertension) Hypertension with adequate control at this time  Continue current blood pressure medications  DM2 (diabetes mellitus, type 2) (HCC) Elevated hemoglobin A1c and poor control of diabetes secondary to the patient's current stress situation along with lack of access to the patient's diabetic medicines and poor diet control due to social circumstances  Plan Increased insulin to 50 units twice daily using NovoLog Continue metformin twice daily Visit with clinical pharmacist was arranged   Bipolar 1 disorder (HCC) Bipolar disorder aggravated by current social circumstances Plan We did refill the Xanax and trazodone We will not prescribe  Seroquel at this time We will connect the patient to our licensed clinical social worker for further connections to behavioral health provider  Chronic pain The patient has chronic pain on the basis of chronic diabetic neuropathy Plan We will prescribe Lyrica 75 mg twice daily and discontinue gabapentin We will not prescribed opioids at this time Review of the [place] DMP website does not  show any other recent prescriptions other than the Xanax 21 tablets that was given at the emergency room visit on 10/11/2018  Uncontrolled type 2 diabetes mellitus with diabetic neuropathy (HCC) Diabetic neuropathy Plan Begin Lyrica 75 mg twice daily  Anxiety and depression Anxiety and depression with an elevated depression score, No suicide risk  Plan Medication adjustment with trazodone and Xanax given Follow-up with licensed clinical social work  Acute bronchitis due to Rhinovirus Acute tracheobronchitis  Plan  Doxycycline x 5 days 100mg  bid Prn SABA   Antonio MaduroRobert was seen today for hospitalization follow-up.  Diagnoses and all orders for this visit:  Type 2 diabetes mellitus with diabetic polyneuropathy, with long-term current use of insulin (HCC) -     POCT glucose (manual entry) -     insulin aspart (novoLOG) injection 15 Units -     POCT glucose (manual entry) -     insulin aspart (novoLOG) injection 15 Units -     POCT glucose (manual entry)  Coronary artery disease involving native heart with unstable angina pectoris, unspecified vessel or lesion type (HCC)  Essential hypertension  Bipolar 1 disorder (HCC)  Chronic pain syndrome  Hyperlipidemia associated with type 2 diabetes mellitus (HCC)  Uncontrolled type 2 diabetes mellitus with diabetic neuropathy (HCC)  Anxiety and depression  Acute bronchitis due to Rhinovirus  Other orders -     metFORMIN (GLUCOPHAGE) 500 MG tablet; Take 1 tablet (500 mg total) by mouth 2 (two) times daily with a meal. -     lisinopril  (PRINIVIL,ZESTRIL) 10 MG tablet; Take 1 tablet (10 mg total) by mouth daily. -     insulin aspart (NOVOLOG FLEXPEN) 100 UNIT/ML FlexPen; Inject 50 units into the skin before breakfast and 50 units at bedtime -     furosemide (LASIX) 40 MG tablet; Take 1 tablet (40 mg total) by mouth 2 (two) times daily. -     clopidogrel (PLAVIX) 75 MG tablet; Take 1 tablet (75 mg total) by mouth daily. -     Discontinue: alprazolam (XANAX) 2 MG tablet; Take 1 tablet (2 mg total) by mouth 3 (three) times daily as needed for anxiety. -     albuterol (PROVENTIL HFA;VENTOLIN HFA) 108 (90 Base) MCG/ACT inhaler; Inhale 1 puff into the lungs every 6 (six) hours as needed for wheezing or shortness of breath. -     pregabalin (LYRICA) 75 MG capsule; Take 1 capsule (75 mg total) by mouth 2 (two) times daily. -     traZODone (DESYREL) 100 MG tablet; Take 1 tablet (100 mg total) by mouth at bedtime. -     doxycycline (VIBRA-TABS) 100 MG tablet; Take 1 tablet (100 mg total) by mouth 2 (two) times daily. -     alprazolam (XANAX) 2 MG tablet; Take 1 tablet (2 mg total) by mouth 3 (three) times daily as needed for anxiety.

## 2018-10-18 NOTE — Assessment & Plan Note (Signed)
Hypertension with adequate control at this time  Continue current blood pressure medications

## 2018-10-18 NOTE — Patient Instructions (Addendum)
Refills on your current medications were sent to the pharmacy  Stop gabapentin and begin Lyrica 75 mg twice daily  Doxycycline twice daily for 5 days was sent for bronchitis  Increase insulin to 50 units twice daily  Trazodone 100 mg at bedtime was sent to the pharmacy  Additional supply of Xanax 2 mg every 8 hours as needed was sent to the pharmacy  We will not prescribe Seroquel at this time  We need to obtain a visit with our clinical pharmacist to review your diabetes and as well with our licensed clinical social worker to review your mental health needs  Dr. Delford FieldWright will see you back on January 13 for follow-up and then from there we will establish primary care

## 2018-10-18 NOTE — Assessment & Plan Note (Signed)
History of coronary artery disease however current anatomy is not clear as we do not have old records  Recent chest pain syndrome that resulted in emergency room visit has now resolved  The evaluation in the emergency room did not reveal ongoing ischemia or acute coronary syndrome  We are suspecting stress related factors contributed to the patient's angina

## 2018-10-18 NOTE — Assessment & Plan Note (Signed)
Diabetic neuropathy Plan Begin Lyrica 75 mg twice daily

## 2018-10-18 NOTE — Progress Notes (Signed)
Pt states he is also having pain in his legs

## 2018-10-18 NOTE — Assessment & Plan Note (Signed)
Elevated hemoglobin A1c and poor control of diabetes secondary to the patient's current stress situation along with lack of access to the patient's diabetic medicines and poor diet control due to social circumstances  Plan Increased insulin to 50 units twice daily using NovoLog Continue metformin twice daily Visit with clinical pharmacist was arranged

## 2018-10-19 DIAGNOSIS — J206 Acute bronchitis due to rhinovirus: Secondary | ICD-10-CM | POA: Insufficient documentation

## 2018-10-19 NOTE — Assessment & Plan Note (Signed)
Acute tracheobronchitis  Plan  Doxycycline x 5 days 100mg  bid Prn SABA

## 2018-10-21 ENCOUNTER — Other Ambulatory Visit: Payer: Self-pay | Admitting: Critical Care Medicine

## 2018-10-21 MED ORDER — PREGABALIN 75 MG PO CAPS: 75.0000 mg | ORAL_CAPSULE | Freq: Two times a day (BID) | ORAL | 6 refills | Status: DC

## 2018-10-21 NOTE — Congregational Nurse Program (Signed)
Saw client this AM with c/o "not feeling well". No SOB, vague c/o pain without definitive location or intensity.Continued preoccupation with what he perceived as staff's favoring certain clients. After several minuets in office, client no longer voiced any feelings of discomfort. Will monitor when necessary.

## 2018-10-25 ENCOUNTER — Other Ambulatory Visit: Payer: Self-pay | Admitting: Critical Care Medicine

## 2018-10-25 MED ORDER — PREGABALIN 75 MG PO CAPS: 75.0000 mg | ORAL_CAPSULE | Freq: Two times a day (BID) | ORAL | 4 refills | Status: DC

## 2018-11-06 ENCOUNTER — Other Ambulatory Visit: Payer: Self-pay | Admitting: Critical Care Medicine

## 2018-11-06 DIAGNOSIS — J206 Acute bronchitis due to rhinovirus: Secondary | ICD-10-CM

## 2018-11-06 MED ORDER — NITROGLYCERIN 0.4 MG SL SUBL: 0.4000 mg | SUBLINGUAL_TABLET | SUBLINGUAL | 3 refills | Status: DC | PRN

## 2018-11-06 MED ORDER — AZITHROMYCIN 250 MG PO TABS: ORAL_TABLET | ORAL | 0 refills | Status: DC

## 2018-11-06 MED ORDER — PROMETHAZINE-DM 6.25-15 MG/5ML PO SYRP: 5.0000 mL | ORAL_SOLUTION | Freq: Four times a day (QID) | ORAL | 0 refills | Status: DC | PRN

## 2018-11-06 NOTE — Progress Notes (Signed)
   F/u with chest pain issues.  Pt still with more cough productive thick yellow mucus.  Needs NTG tabs.  Notes CP at night.    Has f/u with me 1/13 at [facility].   BP 152/90 P 95  Spo2 95  Gen: Pleasant, well-nourished, in no distress,  normal affect  ENT: No lesions,  mouth clear,  oropharynx clear, no postnasal drip  Neck: No JVD, no TMG, no carotid bruits  Lungs: No use of accessory muscles, no dullness to percussion,  rrhonchi  Cardiovascular: RRR, heart sounds normal, no murmur or gallops, no peripheral edema  Abdomen: soft and NT, no HSM,  BS normal  Musculoskeletal: No deformities, no cyanosis or clubbing  Neuro: alert, non focal  Skin: Warm, no lesions or rashes  No results found.   Assess:  Acute recurrent bronchitis  Plan   Rx Azithromycin x 5 days Refilled NTG Rx promethazine DM Order CXR for future   Shan Levans

## 2018-11-08 ENCOUNTER — Emergency Department (HOSPITAL_COMMUNITY)
Admission: EM | Admit: 2018-11-08 | Discharge: 2018-11-08 | Disposition: A | Discharge status: Home / Self Care | Payer: Medicare (Managed Care) | Attending: Emergency Medicine | Admitting: Emergency Medicine

## 2018-11-08 ENCOUNTER — Emergency Department (HOSPITAL_COMMUNITY): Payer: Medicare (Managed Care)

## 2018-11-08 ENCOUNTER — Other Ambulatory Visit: Payer: Self-pay

## 2018-11-08 ENCOUNTER — Encounter (HOSPITAL_COMMUNITY): Payer: Self-pay

## 2018-11-08 DIAGNOSIS — F419 Anxiety disorder, unspecified: Secondary | ICD-10-CM | POA: Diagnosis not present

## 2018-11-08 DIAGNOSIS — G8929 Other chronic pain: Secondary | ICD-10-CM

## 2018-11-08 DIAGNOSIS — E119 Type 2 diabetes mellitus without complications: Secondary | ICD-10-CM | POA: Insufficient documentation

## 2018-11-08 DIAGNOSIS — R0602 Shortness of breath: Secondary | ICD-10-CM | POA: Diagnosis not present

## 2018-11-08 DIAGNOSIS — Z7902 Long term (current) use of antithrombotics/antiplatelets: Secondary | ICD-10-CM | POA: Insufficient documentation

## 2018-11-08 DIAGNOSIS — Z79899 Other long term (current) drug therapy: Secondary | ICD-10-CM | POA: Diagnosis not present

## 2018-11-08 DIAGNOSIS — Z794 Long term (current) use of insulin: Secondary | ICD-10-CM | POA: Insufficient documentation

## 2018-11-08 DIAGNOSIS — I2510 Atherosclerotic heart disease of native coronary artery without angina pectoris: Secondary | ICD-10-CM | POA: Insufficient documentation

## 2018-11-08 DIAGNOSIS — M549 Dorsalgia, unspecified: Secondary | ICD-10-CM | POA: Diagnosis not present

## 2018-11-08 DIAGNOSIS — R0789 Other chest pain: Secondary | ICD-10-CM | POA: Diagnosis present

## 2018-11-08 DIAGNOSIS — R05 Cough: Secondary | ICD-10-CM | POA: Diagnosis not present

## 2018-11-08 DIAGNOSIS — F17210 Nicotine dependence, cigarettes, uncomplicated: Secondary | ICD-10-CM | POA: Diagnosis not present

## 2018-11-08 DIAGNOSIS — R059 Cough, unspecified: Secondary | ICD-10-CM

## 2018-11-08 LAB — CBC WITH DIFFERENTIAL/PLATELET
Abs Immature Granulocytes: 0.05 10*3/uL (ref 0.00–0.07)
Basophils Absolute: 0 10*3/uL (ref 0.0–0.1)
Basophils Relative: 0 %
Eosinophils Absolute: 0.2 10*3/uL (ref 0.0–0.5)
Eosinophils Relative: 3 %
HCT: 44.6 % (ref 39.0–52.0)
Hemoglobin: 14.3 g/dL (ref 13.0–17.0)
Immature Granulocytes: 1 %
Lymphocytes Relative: 24 %
Lymphs Abs: 2.3 10*3/uL (ref 0.7–4.0)
MCH: 27.9 pg (ref 26.0–34.0)
MCHC: 32.1 g/dL (ref 30.0–36.0)
MCV: 87.1 fL (ref 80.0–100.0)
Monocytes Absolute: 0.5 10*3/uL (ref 0.1–1.0)
Monocytes Relative: 6 %
NRBC: 0 % (ref 0.0–0.2)
Neutro Abs: 6.2 10*3/uL (ref 1.7–7.7)
Neutrophils Relative %: 66 %
Platelets: 243 10*3/uL (ref 150–400)
RBC: 5.12 MIL/uL (ref 4.22–5.81)
RDW: 13.5 % (ref 11.5–15.5)
WBC: 9.3 10*3/uL (ref 4.0–10.5)

## 2018-11-08 LAB — COMPREHENSIVE METABOLIC PANEL
ALT: 11 U/L (ref 0–44)
AST: 13 U/L — ABNORMAL LOW (ref 15–41)
Albumin: 2.9 g/dL — ABNORMAL LOW (ref 3.5–5.0)
Alkaline Phosphatase: 69 U/L (ref 38–126)
Anion gap: 11 (ref 5–15)
BUN: 8 mg/dL (ref 8–23)
CO2: 24 mmol/L (ref 22–32)
Calcium: 8.5 mg/dL — ABNORMAL LOW (ref 8.9–10.3)
Chloride: 102 mmol/L (ref 98–111)
Creatinine, Ser: 0.92 mg/dL (ref 0.61–1.24)
GFR calc non Af Amer: >60 mL/min (ref >60)
Glucose, Bld: 284 mg/dL — ABNORMAL HIGH (ref 70–99)
Potassium: 3.8 mmol/L (ref 3.5–5.1)
Sodium: 137 mmol/L (ref 135–145)
Total Bilirubin: 0.4 mg/dL (ref 0.3–1.2)
Total Protein: 6 g/dL — ABNORMAL LOW (ref 6.5–8.1)

## 2018-11-08 LAB — I-STAT TROPONIN, ED
Troponin i, poc: 0 ng/mL (ref 0.00–0.08)
Troponin i, poc: 0 ng/mL (ref 0.00–0.08)

## 2018-11-08 LAB — BRAIN NATRIURETIC PEPTIDE: B Natriuretic Peptide: 32.4 pg/mL (ref 0.0–100.0)

## 2018-11-08 MED ORDER — ACETAMINOPHEN 500 MG PO TABS
1000.0000 mg | ORAL_TABLET | Freq: Once | ORAL | Status: AC
  Administered 2018-11-08: 1000 mg via ORAL
  Filled 2018-11-08: qty 2

## 2018-11-08 MED ORDER — ASPIRIN 81 MG PO CHEW
324.0000 mg | CHEWABLE_TABLET | Freq: Once | ORAL | Status: AC
  Administered 2018-11-08: 324 mg via ORAL
  Filled 2018-11-08: qty 4
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
## 2018-11-08 NOTE — ED Notes (Signed)
Pt verbalized understanding of discharge paperwork and follow-up care.  °

## 2018-11-08 NOTE — Progress Notes (Signed)
CSW consulted to speak with pt. CSW spoke with pt at bedside. Pt was very tearful and became upset regarding the care that pt was getting in the ED. CSW explained to pt that per MD and chart review, [facility] which allowed pt to get medications filled as needed. Pt has also been [facility] (MD Delford FieldWright). Pt sought further information from CSW on rehab . CSW verbalized to pt that per MD pt can not be admitted to a facility for placement but [facility], pt could possible be set up with PT as needed. Pt expressed understanding of this and [facility]   CSW offered other support to pt and allowed pt to presented concerns-pt denied having any further concerns at this time.CWW will sign off.      Claude MangesKierra S. Eladia Frame, MSW, LCSW-A Emergency Department Clinical Social Worker [phone]

## 2018-11-08 NOTE — ED Triage Notes (Signed)
Pt [facility] ED from homeless shelter with complaints of productive cough and chest pain since 2-3 weeks ago. EMS reports pt has hx of MI and stents, was not given asa en route because patient does not have teeth and could not chew it. Pt states chest pain is 9/10 squeezing "around his heart area". Pt placed in position of comfort with bed locked and lowered, call bell in reach.

## 2018-11-08 NOTE — ED Notes (Signed)
Patient transported to X-ray 
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
## 2018-11-10 NOTE — Congregational Nurse Program (Signed)
Seen by Dr. Delford Field, client presents with productive cough. CXR ordered prior to F/U visit at [facility] 1/13

## 2018-11-10 NOTE — Progress Notes (Signed)
Subjective:    Patient ID: Antonio Sparks, male    DOB: 1951-10-30, 68 y.o.   MRN: [id number]  68 y.o. M    this patient is currently staying in a homeless shelter.  The patient arrived 10 days ago from [place] where he had been seen in a free clinic.  The patient has a longstanding history of COPD, coronary disease with previous stent placements in [place] 4 years ago, hypertension, bipolar disorder with significant anxiety, type 2 diabetes with hyperlipidemia, and chronic pain syndrome from musculoskeletal deformities  The patient also has neuropathy in the lower extremities from uncontrolled diabetes  10/18/2018 at the last visit the patient presented with anginal symptoms, and due to lack of pre-existing database we sent the patient to the emergency room the same day.  Evaluation there showed no evidence of acute coronary syndrome with negative troponins.  The patient did receive refills on his diabetic and hypertensive medications.  The patient presents today for post emergency room follow-up.  The patient now notes a productive cough of thick yellow mucus.  The patient is still actively smoking.  Patient states he is attempting to get back into the cardiology clinic at [facility].  He continues to live in a homeless shelter.   11/11/18 The patient is seen today in the clinic and was last seen at the homeless shelter clinic on 11/06/2018.  At that time we thought the patient had coverage to receive his prescriptions at the local [facility].  However it turns out the patient's Medicare prescription coverage is limited and has provided for the patient very significantly high co-pay dollar amount.  He was not able to obtain the antibiotics or cough medicine for the last week.  He was able to obtain the nitroglycerin tablets.  He ended up going to the emergency room on 11/08/2018 for chest pain and back pain.  He was not given any narcotics his evaluation was unremarkable for any  acute process.  His troponins were 0 and his EKG was unremarkable.  He did have a chest x-ray which showed no active process.  Today he is persistent and requesting for opiates.  He does relate that the Lyrica we sent to his local pharmacy last week was not picked up as he did not have the ability to afford the co-pay.  He has been on gabapentin in the past with some relief.  He is requesting orthopedics and pain management referrals.    Chest Pain   This is a chronic (chest pain for two days, nitro helped) problem. The current episode started in the past 7 days. The onset quality is sudden. The problem occurs 2 to 4 times per day. The pain is present in the substernal region. The pain is at a severity of 8/10. The pain is mild. The quality of the pain is described as tightness. Associated symptoms include back pain, a cough, exertional chest pressure, headaches, irregular heartbeat, leg pain, malaise/fatigue, orthopnea, palpitations and shortness of breath. Pertinent negatives include no diaphoresis, dizziness, fever or lower extremity edema. Associated symptoms comments: Yellow mucus with cough . The cough is productive. [place] becomes gray when coughing. He has tried nitroglycerin for the symptoms. The treatment provided significant relief. Risk factors include smoking/tobacco exposure, stress, lack of exercise and male gender.  His past medical history is significant for anxiety/panic attacks, [name], COPD, diabetes and heart disease.  Diabetes  He presents for his follow-up diabetic visit. He has type 2 diabetes mellitus. His disease course  has been fluctuating. Hypoglycemia symptoms include headaches and nervousness/anxiousness. Pertinent negatives for hypoglycemia include no dizziness or sweats. Associated symptoms include chest pain, fatigue, foot paresthesias, polydipsia, polyphagia and polyuria. Symptoms are worsening. Diabetic complications include heart disease and peripheral neuropathy. Risk  factors for coronary artery disease include diabetes mellitus, hypertension, dyslipidemia and male sex. He is compliant with treatment some of the time. His weight is increasing rapidly. He is following a generally unhealthy (has to eat unhealthy food at the shelter ) diet. He has not had a previous visit with a dietitian. He rarely participates in exercise. An ACE inhibitor/angiotensin II receptor blocker is being taken. He does not see a podiatrist.Eye exam is not current.  Cough  This is a recurrent problem. The current episode started yesterday. The problem has been unchanged. The cough is productive of purulent sputum. Associated symptoms include chest pain, headaches and shortness of breath. Pertinent negatives include no chills, ear congestion, ear pain, fever, sore throat, sweats or wheezing. Nothing aggravates the symptoms. His past medical history is significant for bronchitis and COPD.    Past Medical History:  Diagnosis Date  . Bipolar disorder ([place])   . Myocardial infarct [facility])      No family history on file.   Social History   Socioeconomic History  . Marital status: Divorced    Spouse name: Not on file  . Number of children: Not on file  . Years of education: Not on file  . Highest education level: Not on file  Occupational History  . Not on file  Social Needs  . Financial resource strain: Not on file  . Food insecurity:    Worry: Not on file    Inability: Not on file  . Transportation needs:    Medical: Not on file    Non-medical: Not on file  Tobacco Use  . Smoking status: Current Every Day Smoker    Packs/day: 0.25    Types: Cigarettes  . Smokeless tobacco: Never Used  Substance and Sexual Activity  . Alcohol use: Not Currently  . Drug use: Not Currently  . Sexual activity: Not Currently  Lifestyle  . Physical activity:    Days per week: Not on file    Minutes per session: Not on file  . Stress: Not on file  Relationships  . Social connections:     Talks on phone: Not on file    Gets together: Not on file    Attends religious service: Not on file    Active member of club or organization: Not on file    Attends meetings of clubs or organizations: Not on file    Relationship status: Not on file  . Intimate partner violence:    Fear of current or ex partner: Not on file    Emotionally abused: Not on file    Physically abused: Not on file    Forced sexual activity: Not on file  Other Topics Concern  . Not on file  Social History Narrative  . Not on file     Allergies  Allergen Reactions  . Bee Venom Anaphylaxis  . Codeine Nausea And Vomiting and Rash  . Iodine Nausea And Vomiting     Outpatient Medications Prior to Visit  Medication Sig Dispense Refill  . insulin aspart (NOVOLOG FLEXPEN) 100 UNIT/ML FlexPen Inject 50 units into the skin before breakfast and 50 units at bedtime 15 mL 6  . nitroGLYCERIN (NITROSTAT) 0.4 MG SL tablet Place 1 tablet (0.4 mg total) under  the tongue every 5 (five) minutes as needed for chest pain. 50 tablet 3  . albuterol (PROVENTIL HFA;VENTOLIN HFA) 108 (90 Base) MCG/ACT inhaler Inhale 1 puff into the lungs every 6 (six) hours as needed for wheezing or shortness of breath. 1 Inhaler 0  . furosemide (LASIX) 40 MG tablet Take 1 tablet (40 mg total) by mouth 2 (two) times daily. 60 tablet 6  . lisinopril (PRINIVIL,ZESTRIL) 10 MG tablet Take 1 tablet (10 mg total) by mouth daily. 30 tablet 0  . alprazolam (XANAX) 2 MG tablet Take 1 tablet (2 mg total) by mouth 3 (three) times daily as needed for anxiety. (Patient not taking: Reported on 11/11/2018) 60 tablet 1  . azithromycin (ZITHROMAX) 250 MG tablet Take two once then one daily until gone (Patient not taking: Reported on 11/08/2018) 6 tablet 0  . clopidogrel (PLAVIX) 75 MG tablet Take 1 tablet (75 mg total) by mouth daily. (Patient not taking: Reported on 11/11/2018) 30 tablet 0  . metFORMIN (GLUCOPHAGE) 500 MG tablet Take 1 tablet (500 mg total) by mouth 2  (two) times daily with a meal. (Patient not taking: Reported on 11/11/2018) 60 tablet 0  . oxyCODONE-acetaminophen (PERCOCET) 10-325 MG tablet Take 1 tablet by mouth 3 (three) times daily. (Patient not taking: Reported on 11/11/2018) 20 tablet 0  . pregabalin (LYRICA) 75 MG capsule Take 1 capsule (75 mg total) by mouth 2 (two) times daily. (Patient not taking: Reported on 11/11/2018) 60 capsule 4  . promethazine-dextromethorphan (PROMETHAZINE-DM) 6.25-15 MG/5ML syrup Take 5 mLs by mouth 4 (four) times daily as needed for cough. (Patient not taking: Reported on 11/08/2018) 118 mL 0  . traZODone (DESYREL) 100 MG tablet Take 1 tablet (100 mg total) by mouth at bedtime. (Patient not taking: Reported on 11/11/2018) 30 tablet 6   No facility-administered medications prior to visit.      Review of Systems  Constitutional: Positive for fatigue and malaise/fatigue. Negative for chills, diaphoresis and fever.  HENT: Negative for ear pain and sore throat.   Respiratory: Positive for cough and shortness of breath. Negative for wheezing.   Cardiovascular: Positive for chest pain, palpitations and orthopnea.  Endocrine: Positive for polydipsia, polyphagia and polyuria.  Musculoskeletal: Positive for back pain.  Neurological: Positive for headaches. Negative for dizziness.  Psychiatric/Behavioral: The patient is nervous/anxious.        Objective:   Physical Exam  Vitals:   11/11/18 1016  BP: 129/85  Pulse: 93  Resp: 20  Temp: 98.7 F (37.1 C)  TempSrc: Oral  SpO2: 97%  Weight: 263 lb (119.3 kg)    Gen: Pleasant, obese, in no distress,  normal affect  ENT: No lesions,  mouth clear,  oropharynx clear, no postnasal drip  Neck: No JVD, no TMG, no carotid bruits  Lungs: No use of accessory muscles, no dullness to percussion, distant BS, scattered rhonchi, and exp wheezes  Cardiovascular: RRR, heart sounds normal, no murmur or gallops, no peripheral edema  Abdomen: soft and NT, no HSM,  BS  normal  Musculoskeletal: No deformities, no cyanosis or clubbing  Neuro: alert, non focal  Skin: Warm, no lesions or rashes  No results found.     Office Visit from 10/18/2018 in [facility]  PHQ-9 Total Score  14      HgA1C 11 11/06/18 BMP Latest Ref Rng & Units 11/08/2018 10/11/2018 10/27/2008  Glucose 70 - 99 mg/dL 284(H) 357(H) 177(H)  BUN 8 - 23 mg/dL 8 14 20   Creatinine  0.61 - 1.24 mg/dL 0.92 1.10 1.0  Sodium 135 - 145 mmol/L 137 137 140  Potassium 3.5 - 5.1 mmol/L 3.8 4.1 4.0  Chloride 98 - 111 mmol/L 102 100 108  CO2 22 - 32 mmol/L 24 23 -  Calcium 8.9 - 10.3 mg/dL 8.5(L) 8.5(L) -   CBC Latest Ref Rng & Units 11/08/2018 10/11/2018 10/27/2008  WBC 4.0 - 10.5 K/uL 9.3 13.2(H) -  Hemoglobin 13.0 - 17.0 g/dL 14.3 14.0 16.7  Hematocrit 39.0 - 52.0 % 44.6 43.8 49.0  Platelets 150 - 400 K/uL 243 199 -   CBG (last 3)  489>>>384  11/08/18:  CXR NAD  1/10 ECG NSR  RBBB no acute change BNP 32  Trop 0.00 Assessment & Plan:  I personally reviewed all images and lab data in the [facility] system as well as any outside material available during this office visit and agree with the  radiology impressions.   HTN (hypertension) Hypertension currently controlled and patient is on lisinopril however has ongoing cough  We will plan to discontinue lisinopril and begin losartan 50 mg daily  [name] (coronary artery disease) The patient has been in the emergency room on several occasions with chest pain with negative troponin and normal EKG. note also the BN P is normal  Recent chest x-ray obtained showed no active process  Plan Nitroglycerin was refilled We still do not have records from his [place] admissions to determine whether there is coronary disease We will refill the Plavix for now  Acute bronchitis due to other specified organisms Ongoing acute tracheobronchitis, we attempted to send azithromycin to the patient's pharmacy last week however the patient  does not have the dollars for the co-pay for either this or his other chronic medications  We will send azithromycin to our local pharmacy here today in the clinic for 5-day course along with a cough suppressant  Uncontrolled type 2 diabetes mellitus with diabetic neuropathy ([place]) Ongoing uncontrolled type 2 diabetes with diabetic neuropathy noted The patient was unable to obtain the Lyrica O2 owing to insurance coverage issues  Plan We will discontinue Lyrica and begin gabapentin for 100 mg 3 times daily  DM2 (diabetes mellitus, type 2) ([place]) Diabetes mellitus with poor control noted hemoglobin A1c of 11 and CBG greater than 300 at today's visit  Patient is now saying that while he has the NovoLog he has not been using it because he does not have insulin needles  Also the patient has not been using his metformin regularly The patient resides in a homeless shelter with intermittent access to food that is carbohydrate rich  Plan Metformin was refilled at 500 mg twice daily and insulin needles were provided to the patient today along with a glucose meter and testing supplies  Bipolar 1 disorder ([place]) History of bipolar disorder the patient currently has trazodone only He was given a short course of Xanax but clearly needs a mental health provider  The patient has an appointment today with our licensed clinical social worker  Chronic pain Chronic pain syndrome with lower back pain as well with associated neuropathy  Referrals to orthopedics and chronic pain specialist were provided  Chronic bilateral low back pain without sciatica Chronic low back pain without sciatica  Referral to orthopedics was obtained   Alexandr was seen today for follow-up.  Diagnoses and all orders for this visit:  Uncontrolled type 2 diabetes mellitus with diabetic neuropathy ([place])  Type 2 diabetes mellitus with diabetic polyneuropathy, with long-term current use of  insulin (H[facility]) -     Glucose  (CBG) -     For home use only DME 4 wheeled rolling walker with seat ([id number]) -     Ambulatory referral to Pain Clinic  Chronic bilateral low back pain without sciatica -     For home use only DME 4 wheeled rolling walker with seat ([id number]) -     Ambulatory referral to Orthopedic Surgery  Chronic pain syndrome -     For home use only DME 4 wheeled rolling walker with seat ([id number]) -     Ambulatory referral to Pain Clinic -     Ambulatory referral to Orthopedic Surgery  Essential hypertension  Acute bronchitis due to other specified organisms  Bipolar 1 disorder ([place])  Coronary artery disease involving native heart with unstable angina pectoris, unspecified vessel or lesion type ([place])  Other orders -     traZODone (DESYREL) 100 MG tablet; Take 1 tablet (100 mg total) by mouth at bedtime. -     metFORMIN (GLUCOPHAGE) 500 MG tablet; Take 1 tablet (500 mg total) by mouth 2 (two) times daily with a meal. -     furosemide (LASIX) 40 MG tablet; Take 1 tablet (40 mg total) by mouth 2 (two) times daily. -     clopidogrel (PLAVIX) 75 MG tablet; Take 1 tablet (75 mg total) by mouth daily. -     albuterol (PROVENTIL HFA;VENTOLIN HFA) 108 (90 Base) MCG/ACT inhaler; Inhale 1 puff into the lungs every 6 (six) hours as needed for wheezing or shortness of breath. -     gabapentin (NEURONTIN) 400 MG capsule; Take 1 capsule (400 mg total) by mouth 3 (three) times daily. -     azithromycin (ZITHROMAX) 250 MG tablet; Take two once then one daily until gone -     losartan (COZAAR) 50 MG tablet; Take 1 tablet (50 mg total) by mouth daily. -     promethazine-dextromethorphan (PROMETHAZINE-DM) 6.25-15 MG/5ML syrup; Take 5 mLs by mouth 4 (four) times daily as needed for cough. -     Blood Glucose Monitoring Suppl (TRUE METRIX METER) w/Device KIT; Use as directed -     Insulin Pen Needle (PEN NEEDLES) 31G X 5 MM MISC; 1 Units by Does not apply route 2 (two) times daily. Use with novolog pens -      glucose blood (TRUE METRIX BLOOD GLUCOSE TEST) test strip; Use as instructed -     TRUEPLUS LANCETS 28G MISC; Use as directed

## 2018-11-11 ENCOUNTER — Ambulatory Visit (HOSPITAL_BASED_OUTPATIENT_CLINIC_OR_DEPARTMENT_OTHER): Payer: Medicare (Managed Care) | Admitting: Licensed Clinical Social Worker

## 2018-11-11 ENCOUNTER — Other Ambulatory Visit: Payer: Self-pay

## 2018-11-11 ENCOUNTER — Ambulatory Visit: Payer: Medicare (Managed Care) | Attending: Critical Care Medicine | Admitting: Critical Care Medicine

## 2018-11-11 ENCOUNTER — Encounter: Payer: Self-pay | Admitting: Critical Care Medicine

## 2018-11-11 ENCOUNTER — Telehealth: Payer: Self-pay

## 2018-11-11 ENCOUNTER — Other Ambulatory Visit: Payer: Self-pay | Admitting: Pharmacist

## 2018-11-11 VITALS — BP 129/85 | HR 93 | Temp 98.7°F | Resp 20 | Wt 263.0 lb

## 2018-11-11 DIAGNOSIS — F331 Major depressive disorder, recurrent, moderate: Secondary | ICD-10-CM

## 2018-11-11 DIAGNOSIS — E1142 Type 2 diabetes mellitus with diabetic polyneuropathy: Secondary | ICD-10-CM

## 2018-11-11 DIAGNOSIS — I252 Old myocardial infarction: Secondary | ICD-10-CM | POA: Insufficient documentation

## 2018-11-11 DIAGNOSIS — Z7902 Long term (current) use of antithrombotics/antiplatelets: Secondary | ICD-10-CM | POA: Insufficient documentation

## 2018-11-11 DIAGNOSIS — J440 Chronic obstructive pulmonary disease with acute lower respiratory infection: Secondary | ICD-10-CM | POA: Insufficient documentation

## 2018-11-11 DIAGNOSIS — I25110 Atherosclerotic heart disease of native coronary artery with unstable angina pectoris: Secondary | ICD-10-CM

## 2018-11-11 DIAGNOSIS — G894 Chronic pain syndrome: Secondary | ICD-10-CM | POA: Diagnosis not present

## 2018-11-11 DIAGNOSIS — M545 Low back pain, unspecified: Secondary | ICD-10-CM | POA: Insufficient documentation

## 2018-11-11 DIAGNOSIS — I10 Essential (primary) hypertension: Secondary | ICD-10-CM | POA: Diagnosis present

## 2018-11-11 DIAGNOSIS — Z79899 Other long term (current) drug therapy: Secondary | ICD-10-CM | POA: Insufficient documentation

## 2018-11-11 DIAGNOSIS — F17210 Nicotine dependence, cigarettes, uncomplicated: Secondary | ICD-10-CM | POA: Diagnosis not present

## 2018-11-11 DIAGNOSIS — F319 Bipolar disorder, unspecified: Secondary | ICD-10-CM | POA: Diagnosis not present

## 2018-11-11 DIAGNOSIS — Z881 Allergy status to other antibiotic agents status: Secondary | ICD-10-CM | POA: Insufficient documentation

## 2018-11-11 DIAGNOSIS — E1165 Type 2 diabetes mellitus with hyperglycemia: Secondary | ICD-10-CM | POA: Diagnosis not present

## 2018-11-11 DIAGNOSIS — Z5900 Homelessness unspecified: Secondary | ICD-10-CM

## 2018-11-11 DIAGNOSIS — F419 Anxiety disorder, unspecified: Secondary | ICD-10-CM

## 2018-11-11 DIAGNOSIS — Z91030 Bee allergy status: Secondary | ICD-10-CM | POA: Insufficient documentation

## 2018-11-11 DIAGNOSIS — Z955 Presence of coronary angioplasty implant and graft: Secondary | ICD-10-CM | POA: Insufficient documentation

## 2018-11-11 DIAGNOSIS — J208 Acute bronchitis due to other specified organisms: Secondary | ICD-10-CM

## 2018-11-11 DIAGNOSIS — G8929 Other chronic pain: Secondary | ICD-10-CM

## 2018-11-11 DIAGNOSIS — Z794 Long term (current) use of insulin: Secondary | ICD-10-CM | POA: Diagnosis not present

## 2018-11-11 DIAGNOSIS — E1140 Type 2 diabetes mellitus with diabetic neuropathy, unspecified: Secondary | ICD-10-CM

## 2018-11-11 DIAGNOSIS — IMO0002 Reserved for concepts with insufficient information to code with codable children: Secondary | ICD-10-CM

## 2018-11-11 DIAGNOSIS — Z885 Allergy status to narcotic agent status: Secondary | ICD-10-CM | POA: Diagnosis not present

## 2018-11-11 LAB — GLUCOSE, POCT (MANUAL RESULT ENTRY): POC Glucose: 305 mg/dl — AB (ref 70–99)

## 2018-11-11 MED ORDER — GABAPENTIN 400 MG PO CAPS: 400.0000 mg | ORAL_CAPSULE | Freq: Three times a day (TID) | ORAL | 1 refills | Status: DC

## 2018-11-11 MED ORDER — ALBUTEROL SULFATE HFA 108 (90 BASE) MCG/ACT IN AERS: 1.0000 | INHALATION_SPRAY | Freq: Four times a day (QID) | RESPIRATORY_TRACT | 0 refills | Status: DC | PRN

## 2018-11-11 MED ORDER — LOSARTAN POTASSIUM 50 MG PO TABS: 50.0000 mg | ORAL_TABLET | Freq: Every day | ORAL | 11 refills | Status: DC

## 2018-11-11 MED ORDER — AZITHROMYCIN 250 MG PO TABS: ORAL_TABLET | ORAL | 0 refills | Status: DC

## 2018-11-11 MED ORDER — CLOPIDOGREL BISULFATE 75 MG PO TABS: 75.0000 mg | ORAL_TABLET | Freq: Every day | ORAL | 6 refills | Status: DC

## 2018-11-11 MED ORDER — GLUCOSE BLOOD VI STRP: ORAL_STRIP | 11 refills | Status: DC

## 2018-11-11 MED ORDER — PROMETHAZINE-DM 6.25-15 MG/5ML PO SYRP: 5.0000 mL | ORAL_SOLUTION | Freq: Four times a day (QID) | ORAL | 0 refills | Status: DC | PRN

## 2018-11-11 MED ORDER — TRAZODONE HCL 100 MG PO TABS: 100.0000 mg | ORAL_TABLET | Freq: Every day | ORAL | 6 refills | Status: DC

## 2018-11-11 MED ORDER — ONETOUCH VERIO W/DEVICE KIT: PACK | 0 refills | Status: DC

## 2018-11-11 MED ORDER — FUROSEMIDE 40 MG PO TABS: 40.0000 mg | ORAL_TABLET | Freq: Two times a day (BID) | ORAL | 6 refills | Status: DC

## 2018-11-11 MED ORDER — PEN NEEDLES 31G X 5 MM MISC: 1.0000 [IU] | Freq: Two times a day (BID) | 0 refills | Status: DC

## 2018-11-11 MED ORDER — GLUCOSE BLOOD VI STRP: ORAL_STRIP | 12 refills | Status: DC

## 2018-11-11 MED ORDER — METFORMIN HCL 500 MG PO TABS: 500.0000 mg | ORAL_TABLET | Freq: Two times a day (BID) | ORAL | 0 refills | Status: DC

## 2018-11-11 MED ORDER — TRUEPLUS LANCETS 28G MISC: 6 refills | Status: DC

## 2018-11-11 MED ORDER — ONETOUCH LANCETS MISC: 11 refills | Status: DC

## 2018-11-11 MED ORDER — TRUE METRIX METER W/DEVICE KIT: PACK | 0 refills | Status: DC

## 2018-11-11 MED FILL — LOSARTAN POTASSIUM 50 MG TA: 50 | 30 days supply | Qty: 30 | Fill #0

## 2018-11-11 MED FILL — CLOPIDOGREL 75 MG TABLET: 75 | 30 days supply | Qty: 30 | Fill #0

## 2018-11-11 MED FILL — FUROSEMIDE 40 MG TAB: 40 | 30 days supply | Qty: 60 | Fill #0

## 2018-11-11 MED FILL — AZITHROMYCIN 250 MG TABLET: 250 | 5 days supply | Qty: 6 | Fill #0

## 2018-11-11 MED FILL — GABAPENTIN 400 MG CAPSULE: 400 | 30 days supply | Qty: 90 | Fill #0

## 2018-11-11 MED FILL — TRUEPLUS PEN NDL 31GX3/16: 31G X 5 MM | 50 days supply | Qty: 100 | Fill #0

## 2018-11-11 MED FILL — metFORMIN HCL 500 MG TABS: 500 | 30 days supply | Qty: 60 | Fill #0

## 2018-11-11 MED FILL — VENTOLIN HFA 90 MCG INHALER: 108 (90 BAS | 25 days supply | Qty: 18 | Fill #0

## 2018-11-11 MED FILL — traZODone HCL 100 MG TABS: 100 | 30 days supply | Qty: 30 | Fill #0

## 2018-11-11 MED FILL — TRUEPLUS PEN NDL 31GX3/16": 31G X 5 MM | 50 days supply | Qty: 100 | Fill #0

## 2018-11-11 MED FILL — ONETOUCH VERIO METER SYSTEM: W/DEVICE | 1 days supply | Qty: 1 | Fill #0

## 2018-11-11 MED FILL — ONE TOUCH DELICA 33G LANCET: 30 days supply | Qty: 100 | Fill #0

## 2018-11-11 MED FILL — ONE TOUCH VERIO TEST STRIP: 25 days supply | Qty: 100 | Fill #0
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
## 2018-11-11 NOTE — Assessment & Plan Note (Signed)
Hypertension currently controlled and patient is on lisinopril however has ongoing cough  We will plan to discontinue lisinopril and begin losartan 50 mg daily

## 2018-11-11 NOTE — Assessment & Plan Note (Signed)
Ongoing uncontrolled type 2 diabetes with diabetic neuropathy noted The patient was unable to obtain the Lyrica O2 owing to insurance coverage issues  Plan We will discontinue Lyrica and begin gabapentin for 100 mg 3 times daily

## 2018-11-11 NOTE — Assessment & Plan Note (Signed)
The patient has been in the emergency room on several occasions with chest pain with negative troponin and normal EKG. note also the BN P is normal  Recent chest x-ray obtained showed no active process  Plan Nitroglycerin was refilled We still do not have records from his [place] admissions to determine whether there is coronary disease We will refill the Plavix for now

## 2018-11-11 NOTE — Patient Instructions (Addendum)
All of your medication refills were sent to our pharmacy Note the lisinopril was substituted for losartan 50 mg to take 1 daily, lisinopril has a side effect of cough and the losartan does not  Gabapentin was sent for 100 mg 3 times daily in place of the Lyrica  Insulin pen needles were sent to the pharmacy as well as glucose meter with testing supplies, please check your blood sugar twice daily  Azithromycin was sent for a short course for bronchitis  A rolling walker order was sent  Referrals to the pain management clinic and orthopedics were sent in  Return to see Dr. Delford Field in 3 weeks

## 2018-11-11 NOTE — Assessment & Plan Note (Signed)
Chronic pain syndrome with lower back pain as well with associated neuropathy  Referrals to orthopedics and chronic pain specialist were provided

## 2018-11-11 NOTE — Assessment & Plan Note (Signed)
Chronic low back pain without sciatica  Referral to orthopedics was obtained

## 2018-11-11 NOTE — Assessment & Plan Note (Signed)
History of bipolar disorder the patient currently has trazodone only He was given a short course of Xanax but clearly needs a mental health provider  The patient has an appointment today with our licensed clinical social worker

## 2018-11-11 NOTE — Assessment & Plan Note (Signed)
Ongoing acute tracheobronchitis, we attempted to send azithromycin to the patient's pharmacy last week however the patient does not have the dollars for the co-pay for either this or his other chronic medications  We will send azithromycin to our local pharmacy here today in the clinic for 5-day course along with a cough suppressant

## 2018-11-11 NOTE — Assessment & Plan Note (Signed)
Diabetes mellitus with poor control noted hemoglobin A1c of 11 and CBG greater than 300 at today's visit  Patient is now saying that while he has the NovoLog he has not been using it because he does not have insulin needles  Also the patient has not been using his metformin regularly The patient resides in a homeless shelter with intermittent access to food that is carbohydrate rich  Plan Metformin was refilled at 500 mg twice daily and insulin needles were provided to the patient today along with a glucose meter and testing supplies
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
## 2018-11-13 ENCOUNTER — Encounter: Payer: Self-pay | Admitting: Critical Care Medicine

## 2018-11-13 ENCOUNTER — Telehealth: Payer: Self-pay

## 2018-11-13 MED ORDER — BENZONATATE 200 MG PO CAPS: 200.0000 mg | ORAL_CAPSULE | Freq: Three times a day (TID) | ORAL | 0 refills | Status: DC | PRN

## 2018-11-13 NOTE — Progress Notes (Signed)
Integrated Behavioral Health Initial Visit  MRN: [id number] Name: Antonio Sparks  Number of [place] Clinician visits:: 1/6 Session Start time: 11:00 AM  Session End time: 11:20 AM Total time: 15 minutes  Type of Service: [place] Interpretor:No. Interpretor Name and Language: N/A   Warm Hand Off Completed.       SUBJECTIVE: Antonio Sparks is a 68 y.o. male accompanied by self Patient was referred by Dr. Joya Gaskins for depression, anxiety, community resources. Patient reports the following symptoms/concerns: Pt shared that he is experiencing an increase in stress triggered by recent relocation from [place]. Pt is homeless and residing at [facility] Duration of problem: 3 weeks; Severity of problem: moderate  OBJECTIVE: Mood: Anxious and Depressed and Affect: Tearful Risk of harm to self or others: No plan to harm self or others Pt scored positive on phq9; however, denies current SI/HI. Protective factors identified, safety plan discussed, and crisis resources provided  LIFE CONTEXT: Family and Social: Pt shared that all of his family are deceased, instead of an adult daughter, who is currently in jail in [place] School/Work: Pt has income (approx 2000), medicaid, and medicare Self-Care: Pt reports sobriety from substance use of 9 years. He was successful in identifying healthy coping skills Life Changes: Pt shared that he is experiencing an increase in stress triggered by recent relocation from [place]. Pt is homeless and residing at [facility]: Patient will: 1. Reduce symptoms of: anxiety, depression and stress 2. Increase knowledge and/or ability of: coping skills and healthy habits  3. Demonstrate ability to: Increase healthy adjustment to current life circumstances and Increase adequate support systems for patient/family  INTERVENTIONS: Interventions utilized: Solution-Focused Strategies,  Mindfulness or Psychologist, educational, Supportive Counseling and Psychoeducation and/or Health Education  Standardized Assessments completed: GAD-7 and PHQ 2&9 with C-SSRS  ASSESSMENT: Patient currently experiencing depression and anxiety. Pt shared that he is experiencing an increase in stress triggered by recent relocation from [place]. Pt is homeless and residing at [facility]. He receives limited support.   Patient may benefit from psychoeducation and psychotherapy. LCSWA educated pt on the correlation between one's physical and mental health, in addition, to how stress can negatively impact both. Therapeutic interventions were discussed and behavioral health resources provided. Pt was successful in identifying healthy coping skills. RN CM, Antonio Sparks met with pt briefly to discuss process of placement to a skilled nursing facility (as requested by patient)  PLAN: 1. Follow up with behavioral health clinician on : Pt was encouraged to contact LCSWA if symptoms worsen or fail to improve to schedule behavioral appointments at [facility]. 2. Behavioral recommendations: LCSWA recommends that pt apply healthy coping skills discussed and utilize provided resources. Pt is encouraged to schedule follow up appointment with LCSWA 3. Referral(s): Antonio Sparks (In Clinic) 4. "From scale of 1-10, how likely are you to follow plan?":   Antonio Chesterfield, LCSW 11/15/2018 9:51 AM

## 2018-11-13 NOTE — Telephone Encounter (Signed)
Call placed to [facility]. Spoke to [place] who stated that they are processing the request for the walker. It may take another day or 2 to verify insurance coverage and determine if patient has a co-pay   Information shared with Dr Delford Field.

## 2018-11-14 ENCOUNTER — Encounter (INDEPENDENT_AMBULATORY_CARE_PROVIDER_SITE_OTHER): Payer: Self-pay | Admitting: Critical Care Medicine

## 2018-11-14 NOTE — Progress Notes (Signed)
The pt was not able to obtain the cough meds at local pharmacy d/t insurance issues.     We Rx Benzonatate to [facility] pharmacy for prn cough  Also confirmed walker is being processed by insurance.  Shan Levans

## 2018-11-15 ENCOUNTER — Telehealth: Payer: Self-pay

## 2018-11-15 NOTE — Telephone Encounter (Signed)
[facility] reporting that the patient is inquiring about the walker order. [facility] as of 11/13/2018. [facility]  patient can call and check status as they will need to speak to him about delivery and any co-pay that is needed,

## 2018-11-15 NOTE — Telephone Encounter (Signed)
Message received from [facility], RN/[facility] noting that she was able to get the patient a RW from the [facility] office.

## 2018-11-20 ENCOUNTER — Other Ambulatory Visit: Payer: Self-pay | Admitting: Critical Care Medicine

## 2018-11-20 ENCOUNTER — Encounter: Payer: Self-pay | Admitting: Critical Care Medicine

## 2018-11-20 DIAGNOSIS — Z794 Long term (current) use of insulin: Principal | ICD-10-CM

## 2018-11-20 DIAGNOSIS — E1165 Type 2 diabetes mellitus with hyperglycemia: Secondary | ICD-10-CM

## 2018-11-20 DIAGNOSIS — E1142 Type 2 diabetes mellitus with diabetic polyneuropathy: Secondary | ICD-10-CM

## 2018-11-20 DIAGNOSIS — E1140 Type 2 diabetes mellitus with diabetic neuropathy, unspecified: Secondary | ICD-10-CM

## 2018-11-20 DIAGNOSIS — IMO0002 Reserved for concepts with insufficient information to code with codable children: Secondary | ICD-10-CM

## 2018-11-20 MED ORDER — GABAPENTIN 400 MG PO CAPS: 400.0000 mg | ORAL_CAPSULE | Freq: Three times a day (TID) | ORAL | 1 refills | Status: DC

## 2018-11-20 MED ORDER — NICOTINE POLACRILEX 4 MG MT LOZG: 4.0000 mg | LOZENGE | OROMUCOSAL | 0 refills | Status: DC | PRN

## 2018-11-20 MED ORDER — ALPRAZOLAM 2 MG PO TABS: 2.0000 mg | ORAL_TABLET | Freq: Three times a day (TID) | ORAL | 1 refills | Status: DC | PRN

## 2018-11-20 MED ORDER — ALBUTEROL SULFATE HFA 108 (90 BASE) MCG/ACT IN AERS: 1.0000 | INHALATION_SPRAY | Freq: Four times a day (QID) | RESPIRATORY_TRACT | 0 refills | Status: DC | PRN

## 2018-11-20 MED ORDER — INSULIN ASPART 100 UNIT/ML FLEXPEN: PEN_INJECTOR | SUBCUTANEOUS | 6 refills | Status: DC

## 2018-11-20 MED FILL — NOVOLOG FLEXPEN SYRINGE: 100 | 30 days supply | Qty: 30 | Fill #0

## 2018-11-20 NOTE — Progress Notes (Signed)
Refills on lost meds  

## 2018-11-21 NOTE — Progress Notes (Signed)
Subjective:    Patient ID: Antonio Sparks, male    DOB: 1950-12-30, 68 y.o.   MRN: [id number]  68 y.o. M    this patient is currently staying in a homeless shelter.  The patient arrived 10 days ago from [place] where he had been seen in a free clinic.  The patient has a longstanding history of COPD, coronary disease with previous stent placements in [place] 4 years ago, hypertension, bipolar disorder with significant anxiety, type 2 diabetes with hyperlipidemia, and chronic pain syndrome from musculoskeletal deformities  The patient also has neuropathy in the lower extremities from uncontrolled diabetes  10/18/2018 at the last visit the patient presented with anginal symptoms, and due to lack of pre-existing database we sent the patient to the emergency room the same day.  Evaluation there showed no evidence of acute coronary syndrome with negative troponins.  The patient did receive refills on his diabetic and hypertensive medications.  The patient presents today for post emergency room follow-up.  The patient now notes a productive cough of thick yellow mucus.  The patient is still actively smoking.  Patient states he is attempting to get back into the cardiology clinic at [facility].  He continues to live in a homeless shelter.   11/11/18 The patient is seen today in the clinic and was last seen at the homeless shelter clinic on 11/06/2018.  At that time we thought the patient had coverage to receive his prescriptions at the local [place].  However it turns out the patient's Medicare prescription coverage is limited and has provided for the patient very significantly high co-pay dollar amount.  He was not able to obtain the antibiotics or cough medicine for the last week.  He was able to obtain the nitroglycerin tablets.  He ended up going to the emergency room on 11/08/2018 for chest pain and back pain.  He was not given any narcotics his evaluation was unremarkable for any  acute process.  His troponins were 0 and his EKG was unremarkable.  He did have a chest x-ray which showed no active process.  Today he is persistent and requesting for opiates.  He does relate that the Lyrica we sent to his local pharmacy last week was not picked up as he did not have the ability to afford the co-pay.  He has been on gabapentin in the past with some relief.  He is requesting orthopedics and pain management referrals.   11/20/18  The patient was seen today in the [place] homeless shelter clinic.  He states he lost his insulin, Xanax, gabapentin, and albuterol inhaler while riding a bus recently.  He is needing refills on these medications. He has received the benzonatate for cough and also has all his other medications that he is taking.  The patient also recently obtained a rolling walker from [facility].  He has been well pleased with that addition to his program.  The patient states his cough chest pain and shortness of breath are improved.  The patient is here for the refill request only.   Past Medical History:  Diagnosis Date  . Bipolar disorder ([place])   . Myocardial infarct [facility])      No family history on file.   Social History   Socioeconomic History  . Marital status: Divorced    Spouse name: Not on file  . Number of children: Not on file  . Years of education: Not on file  . Highest education level: Not  on file  Occupational History  . Not on file  Social Needs  . Financial resource strain: Not on file  . Food insecurity:    Worry: Not on file    Inability: Not on file  . Transportation needs:    Medical: Not on file    Non-medical: Not on file  Tobacco Use  . Smoking status: Current Every Day Smoker    Packs/day: 0.25    Types: Cigarettes  . Smokeless tobacco: Never Used  Substance and Sexual Activity  . Alcohol use: Not Currently  . Drug use: Not Currently  . Sexual activity: Not Currently  Lifestyle  . Physical activity:     Days per week: Not on file    Minutes per session: Not on file  . Stress: Not on file  Relationships  . Social connections:    Talks on phone: Not on file    Gets together: Not on file    Attends religious service: Not on file    Active member of club or organization: Not on file    Attends meetings of clubs or organizations: Not on file    Relationship status: Not on file  . Intimate partner violence:    Fear of current or ex partner: Not on file    Emotionally abused: Not on file    Physically abused: Not on file    Forced sexual activity: Not on file  Other Topics Concern  . Not on file  Social History Narrative  . Not on file     Allergies  Allergen Reactions  . Bee Venom Anaphylaxis  . Codeine Nausea And Vomiting and Rash  . Iodine Nausea And Vomiting     Outpatient Medications Prior to Visit  Medication Sig Dispense Refill  . albuterol (PROVENTIL HFA;VENTOLIN HFA) 108 (90 Base) MCG/ACT inhaler Inhale 1 puff into the lungs every 6 (six) hours as needed for wheezing or shortness of breath. 1 Inhaler 0  . alprazolam (XANAX) 2 MG tablet Take 1 tablet (2 mg total) by mouth 3 (three) times daily as needed for anxiety. 60 tablet 1  . azithromycin (ZITHROMAX) 250 MG tablet Take two once then one daily until gone 6 tablet 0  . Blood Glucose Monitoring Suppl (ONETOUCH VERIO) w/Device KIT Use as directed to check blood sugar up to 3 times daily. 1 kit 0  . clopidogrel (PLAVIX) 75 MG tablet Take 1 tablet (75 mg total) by mouth daily. 30 tablet 6  . furosemide (LASIX) 40 MG tablet Take 1 tablet (40 mg total) by mouth 2 (two) times daily. 60 tablet 6  . gabapentin (NEURONTIN) 400 MG capsule Take 1 capsule (400 mg total) by mouth 3 (three) times daily. 90 capsule 1  . glucose blood (ONETOUCH VERIO) test strip Use as instructed 100 each 11  . insulin aspart (NOVOLOG FLEXPEN) 100 UNIT/ML FlexPen Inject 50 units into the skin before breakfast and 50 units at bedtime 15 mL 6  . Insulin  Pen Needle (PEN NEEDLES) 31G X 5 MM MISC 1 Units by Does not apply route 2 (two) times daily. Use with novolog pens 90 each 0  . losartan (COZAAR) 50 MG tablet Take 1 tablet (50 mg total) by mouth daily. 30 tablet 11  . metFORMIN (GLUCOPHAGE) 500 MG tablet Take 1 tablet (500 mg total) by mouth 2 (two) times daily with a meal. 60 tablet 0  . nicotine polacrilex (NICOTINE MINI) 4 MG lozenge Take 1 lozenge (4 mg total) by mouth as needed  for smoking cessation. 100 tablet 0  . nitroGLYCERIN (NITROSTAT) 0.4 MG SL tablet Place 1 tablet (0.4 mg total) under the tongue every 5 (five) minutes as needed for chest pain. 50 tablet 3  . ONE TOUCH LANCETS MISC Use to check blood sugar 3 times daily. 100 each 11  . promethazine-dextromethorphan (PROMETHAZINE-DM) 6.25-15 MG/5ML syrup Take 5 mLs by mouth 4 (four) times daily as needed for cough. 118 mL 0  . traZODone (DESYREL) 100 MG tablet Take 1 tablet (100 mg total) by mouth at bedtime. 30 tablet 6   No facility-administered medications prior to visit.      Review of Systems     Objective:   Physical Exam  VSS Gen: Pleasant, obese, in no distress,  normal affect  Lungs: No use of accessory muscles, no dullness to percussion, distant BS, much improved  Cardiovascular: RRR, heart sounds normal, no murmur or gallops, no peripheral edema  Abdomen: soft and NT, no HSM,  BS normal  Musculoskeletal: No deformities, no cyanosis or clubbing  Neuro: alert, non focal  Skin: Warm, no lesions or rashes  No results found.     Office Visit from 11/11/2018 in [facility]  PHQ-9 Total Score  9      HgA1C 11 11/06/18 BMP Latest Ref Rng & Units 11/08/2018 10/11/2018 10/27/2008  Glucose 70 - 99 mg/dL 284(H) 357(H) 177(H)  BUN 8 - 23 mg/dL _0 Creatinine 0.61 - 1.24 mg/dL 0.92 1.10 1.0  Sodium 135 - 145 mmol/L 137 137 140  Potassium 3.5 - 5.1 mmol/L 3.8 4.1 4.0  Chloride 98 - 111 mmol/L 102 100 108  CO2 22 - 32 mmol/L 24  23 -  Calcium 8.9 - 10.3 mg/dL 8.5(L) 8.5(L) -   CBC Latest Ref Rng & Units 11/08/2018 10/11/2018 10/27/2008  WBC 4.0 - 10.5 K/uL 9.3 13.2(H) -  Hemoglobin 13.0 - 17.0 g/dL 14.3 14.0 16.7  Hematocrit 39.0 - 52.0 % 44.6 43.8 49.0  Platelets 150 - 400 K/uL 243 199 -   CBG (last 3)  489>>>384  11/08/18:  CXR NAD  1/10 ECG NSR  RBBB no acute change BNP 32  Trop 0.00 Assessment & Plan:  I personally reviewed all images and lab data in the [facility] system as well as any outside material available during this office visit and agree with the  radiology impressions.   DM2 (diabetes mellitus, type 2) ([place]) The patient has lost his insulin and [facility]    Uncontrolled type 2 diabetes mellitus with diabetic neuropathy ([place]) Refills of the gabapentin were also sent to the patient's private pharmacy  Refills on the Xanax and albuterol inhaler were sent to the private pharmacy as well

## 2018-11-21 NOTE — Assessment & Plan Note (Signed)
The patient has lost his insulin and we will [facility]

## 2018-11-21 NOTE — Assessment & Plan Note (Signed)
Refills of the gabapentin were also sent to the patient's private pharmacy

## 2018-11-25 ENCOUNTER — Telehealth: Payer: Self-pay

## 2018-11-25 NOTE — Telephone Encounter (Signed)
As per Chyrl Civatte, RN/[facility], the patient does not need the rollator from [facility] as the RW that he was given has a seat.

## 2018-11-26 ENCOUNTER — Ambulatory Visit (INDEPENDENT_AMBULATORY_CARE_PROVIDER_SITE_OTHER): Payer: Self-pay | Admitting: Orthopaedic Surgery

## 2018-11-27 ENCOUNTER — Encounter: Payer: Self-pay | Admitting: Critical Care Medicine

## 2018-11-27 ENCOUNTER — Other Ambulatory Visit: Payer: Self-pay | Admitting: Critical Care Medicine

## 2018-11-27 NOTE — Progress Notes (Signed)
See documentation.

## 2018-11-27 NOTE — Progress Notes (Signed)
Subjective:    Patient ID: Antonio Sparks, male    DOB: 05-10-1951, 68 y.o.   MRN: [id number]  68 y.o. M    this patient is currently staying in a homeless shelter.  The patient arrived 10 days ago from [place] where he had been seen in a free clinic.  The patient has a longstanding history of COPD, coronary disease with previous stent placements in [place] 4 years ago, hypertension, bipolar disorder with significant anxiety, type 2 diabetes with hyperlipidemia, and chronic pain syndrome from musculoskeletal deformities  The patient also has neuropathy in the lower extremities from uncontrolled diabetes  10/18/2018 at the last visit the patient presented with anginal symptoms, and due to lack of pre-existing database we sent the patient to the emergency room the same day.  Evaluation there showed no evidence of acute coronary syndrome with negative troponins.  The patient did receive refills on his diabetic and hypertensive medications.  The patient presents today for post emergency room follow-up.  The patient now notes a productive cough of thick yellow mucus.  The patient is still actively smoking.  Patient states he is attempting to get back into the cardiology clinic at [facility].  He continues to live in a homeless shelter.   11/11/18 The patient is seen today in the clinic and was last seen at the homeless shelter clinic on 11/06/2018.  At that time we thought the patient had coverage to receive his prescriptions at the local [place].  However it turns out the patient's Medicare prescription coverage is limited and has provided for the patient very significantly high co-pay dollar amount.  He was not able to obtain the antibiotics or cough medicine for the last week.  He was able to obtain the nitroglycerin tablets.  He ended up going to the emergency room on 11/08/2018 for chest pain and back pain.  He was not given any narcotics his evaluation was unremarkable for any  acute process.  His troponins were 0 and his EKG was unremarkable.  He did have a chest x-ray which showed no active process.  Today he is persistent and requesting for opiates.  He does relate that the Lyrica we sent to his local pharmacy last week was not picked up as he did not have the ability to afford the co-pay.  He has been on gabapentin in the past with some relief.  He is requesting orthopedics and pain management referrals.   11/27/2018  The patient was seen today in the [place] homeless shelter clinic.  Last week we sent prescriptions in for benzonatate and insulin as he had lost his insulin prescription he has not could obtain these medicines from the pharmacy   The patient also was receiving treatment for tracheobronchitis and this is slowly improved.  He has minimal cough now at this time.  There are been no other changes since the last visit.  This was a check-in and he has a follow-up appointment with me in my clinic on February 3.     Past Medical History:  Diagnosis Date  . Bipolar disorder ([place])   . Myocardial infarct [facility])      No family history on file.   Social History   Socioeconomic History  . Marital status: Divorced    Spouse name: Not on file  . Number of children: Not on file  . Years of education: Not on file  . Highest education level: Not on file  Occupational History  . Not on  file  Social Needs  . Financial resource strain: Not on file  . Food insecurity:    Worry: Not on file    Inability: Not on file  . Transportation needs:    Medical: Not on file    Non-medical: Not on file  Tobacco Use  . Smoking status: Current Every Day Smoker    Packs/day: 0.25    Types: Cigarettes  . Smokeless tobacco: Never Used  Substance and Sexual Activity  . Alcohol use: Not Currently  . Drug use: Not Currently  . Sexual activity: Not Currently  Lifestyle  . Physical activity:    Days per week: Not on file    Minutes per session: Not on file  .  Stress: Not on file  Relationships  . Social connections:    Talks on phone: Not on file    Gets together: Not on file    Attends religious service: Not on file    Active member of club or organization: Not on file    Attends meetings of clubs or organizations: Not on file    Relationship status: Not on file  . Intimate partner violence:    Fear of current or ex partner: Not on file    Emotionally abused: Not on file    Physically abused: Not on file    Forced sexual activity: Not on file  Other Topics Concern  . Not on file  Social History Narrative  . Not on file     Allergies  Allergen Reactions  . Bee Venom Anaphylaxis  . Codeine Nausea And Vomiting and Rash  . Iodine Nausea And Vomiting     Outpatient Medications Prior to Visit  Medication Sig Dispense Refill  . albuterol (PROVENTIL HFA;VENTOLIN HFA) 108 (90 Base) MCG/ACT inhaler Inhale 1 puff into the lungs every 6 (six) hours as needed for wheezing or shortness of breath. 1 Inhaler 0  . alprazolam (XANAX) 2 MG tablet Take 1 tablet (2 mg total) by mouth 3 (three) times daily as needed for anxiety. 60 tablet 1  . azithromycin (ZITHROMAX) 250 MG tablet Take two once then one daily until gone 6 tablet 0  . Blood Glucose Monitoring Suppl (ONETOUCH VERIO) w/Device KIT Use as directed to check blood sugar up to 3 times daily. 1 kit 0  . clopidogrel (PLAVIX) 75 MG tablet Take 1 tablet (75 mg total) by mouth daily. 30 tablet 6  . furosemide (LASIX) 40 MG tablet Take 1 tablet (40 mg total) by mouth 2 (two) times daily. 60 tablet 6  . gabapentin (NEURONTIN) 400 MG capsule Take 1 capsule (400 mg total) by mouth 3 (three) times daily. 90 capsule 1  . glucose blood (ONETOUCH VERIO) test strip Use as instructed 100 each 11  . insulin aspart (NOVOLOG FLEXPEN) 100 UNIT/ML FlexPen Inject 50 units into the skin before breakfast and 50 units at bedtime 15 mL 6  . Insulin Pen Needle (PEN NEEDLES) 31G X 5 MM MISC 1 Units by Does not apply  route 2 (two) times daily. Use with novolog pens 90 each 0  . losartan (COZAAR) 50 MG tablet Take 1 tablet (50 mg total) by mouth daily. 30 tablet 11  . metFORMIN (GLUCOPHAGE) 500 MG tablet Take 1 tablet (500 mg total) by mouth 2 (two) times daily with a meal. 60 tablet 0  . nicotine polacrilex (NICOTINE MINI) 4 MG lozenge Take 1 lozenge (4 mg total) by mouth as needed for smoking cessation. 100 tablet 0  . nitroGLYCERIN (  NITROSTAT) 0.4 MG SL tablet Place 1 tablet (0.4 mg total) under the tongue every 5 (five) minutes as needed for chest pain. 50 tablet 3  . ONE TOUCH LANCETS MISC Use to check blood sugar 3 times daily. 100 each 11  . promethazine-dextromethorphan (PROMETHAZINE-DM) 6.25-15 MG/5ML syrup Take 5 mLs by mouth 4 (four) times daily as needed for cough. 118 mL 0  . traZODone (DESYREL) 100 MG tablet Take 1 tablet (100 mg total) by mouth at bedtime. 30 tablet 6   No facility-administered medications prior to visit.      Review of Systems     Objective:   Physical Exam  VSS Gen: Pleasant, obese, in no distress,  normal affect  Lungs: No use of accessory muscles, no dullness to percussion, distant BS, much improved  Cardiovascular: RRR, heart sounds normal, no murmur or gallops, no peripheral edema  Abdomen: soft and NT, no HSM,  BS normal  Musculoskeletal: No deformities, no cyanosis or clubbing  Neuro: alert, non focal  Skin: Warm, no lesions or rashes  No results found.     Office Visit from 11/11/2018 in [place]  PHQ-9 Total Score  9      HgA1C 11 11/06/18 BMP Latest Ref Rng & Units 11/08/2018 10/11/2018 10/27/2008  Glucose 70 - 99 mg/dL 284(H) 357(H) 177(H)  BUN 8 - 23 mg/dL 8 14 20   Creatinine 0.61 - 1.24 mg/dL 0.92 1.10 1.0  Sodium 135 - 145 mmol/L 137 137 140  Potassium 3.5 - 5.1 mmol/L 3.8 4.1 4.0  Chloride 98 - 111 mmol/L 102 100 108  CO2 22 - 32 mmol/L 24 23 -  Calcium 8.9 - 10.3 mg/dL 8.5(L) 8.5(L) -   CBC Latest Ref  Rng & Units 11/08/2018 10/11/2018 10/27/2008  WBC 4.0 - 10.5 K/uL 9.3 13.2(H) -  Hemoglobin 13.0 - 17.0 g/dL 14.3 14.0 16.7  Hematocrit 39.0 - 52.0 % 44.6 43.8 49.0  Platelets 150 - 400 K/uL 243 199 -   CBG (last 3)  489>>>384  11/08/18:  CXR NAD  1/10 ECG NSR  RBBB no acute change BNP 32  Trop 0.00 Assessment & Plan:  We did not send any refills and today I gave the patient reassurance and we will follow-up with this patient on February 3 for further treatment and care

## 2018-12-02 ENCOUNTER — Ambulatory Visit: Payer: Medicare (Managed Care) | Admitting: Critical Care Medicine

## 2018-12-02 NOTE — Progress Notes (Deleted)
Subjective:    Patient ID: Antonio Sparks, male    DOB: 02/02/1951, 68 y.o.   MRN: [id number]  68 y.o. M    this patient is currently staying in a homeless shelter.  The patient arrived 10 days ago from [place] where he had been seen in a free clinic.  The patient has a longstanding history of COPD, coronary disease with previous stent placements in [place] 4 years ago, hypertension, bipolar disorder with significant anxiety, type 2 diabetes with hyperlipidemia, and chronic pain syndrome from musculoskeletal deformities  The patient also has neuropathy in the lower extremities from uncontrolled diabetes  10/18/2018 at the last visit the patient presented with anginal symptoms, and due to lack of pre-existing database we sent the patient to the emergency room the same day.  Evaluation there showed no evidence of acute coronary syndrome with negative troponins.  The patient did receive refills on his diabetic and hypertensive medications.  The patient presents today for post emergency room follow-up.  The patient now notes a productive cough of thick yellow mucus.  The patient is still actively smoking.  Patient states he is attempting to get back into the cardiology clinic at [facility].  He continues to live in a homeless shelter.   11/11/18 The patient is seen today in the clinic and was last seen at the homeless shelter clinic on 11/06/2018.  At that time we thought the patient had coverage to receive his prescriptions at the local [place].  However it turns out the patient's Medicare prescription coverage is limited and has provided for the patient very significantly high co-pay dollar amount.  He was not able to obtain the antibiotics or cough medicine for the last week.  He was able to obtain the nitroglycerin tablets.  He ended up going to the emergency room on 11/08/2018 for chest pain and back pain.  He was not given any narcotics his evaluation was unremarkable for any  acute process.  His troponins were 0 and his EKG was unremarkable.  He did have a chest x-ray which showed no active process.  Today he is persistent and requesting for opiates.  He does relate that the Lyrica we sent to his local pharmacy last week was not picked up as he did not have the ability to afford the co-pay.  He has been on gabapentin in the past with some relief.  He is requesting orthopedics and pain management referrals.   11/27/2018  The patient was seen today in the [place] homeless shelter clinic.  Last week we sent prescriptions in for benzonatate and insulin as he had lost his insulin prescription he has not could obtain these medicines from the pharmacy   The patient also was receiving treatment for tracheobronchitis and this is slowly improved.  He has minimal cough now at this time.  There are been no other changes since the last visit.  This was a check-in and he has a follow-up appointment with me in my clinic on February 3.  12/02/2018    Past Medical History:  Diagnosis Date  . Bipolar disorder ([place])   . Myocardial infarct [facility])      No family history on file.   Social History   Socioeconomic History  . Marital status: Divorced    Spouse name: Not on file  . Number of children: Not on file  . Years of education: Not on file  . Highest education level: Not on file  Occupational History  . Not  on file  Social Needs  . Financial resource strain: Not on file  . Food insecurity:    Worry: Not on file    Inability: Not on file  . Transportation needs:    Medical: Not on file    Non-medical: Not on file  Tobacco Use  . Smoking status: Current Every Day Smoker    Packs/day: 0.25    Types: Cigarettes  . Smokeless tobacco: Never Used  Substance and Sexual Activity  . Alcohol use: Not Currently  . Drug use: Not Currently  . Sexual activity: Not Currently  Lifestyle  . Physical activity:    Days per week: Not on file    Minutes per session: Not on file   . Stress: Not on file  Relationships  . Social connections:    Talks on phone: Not on file    Gets together: Not on file    Attends religious service: Not on file    Active member of club or organization: Not on file    Attends meetings of clubs or organizations: Not on file    Relationship status: Not on file  . Intimate partner violence:    Fear of current or ex partner: Not on file    Emotionally abused: Not on file    Physically abused: Not on file    Forced sexual activity: Not on file  Other Topics Concern  . Not on file  Social History Narrative  . Not on file     Allergies  Allergen Reactions  . Bee Venom Anaphylaxis  . Codeine Nausea And Vomiting and Rash  . Iodine Nausea And Vomiting     Outpatient Medications Prior to Visit  Medication Sig Dispense Refill  . albuterol (PROVENTIL HFA;VENTOLIN HFA) 108 (90 Base) MCG/ACT inhaler Inhale 1 puff into the lungs every 6 (six) hours as needed for wheezing or shortness of breath. 1 Inhaler 0  . alprazolam (XANAX) 2 MG tablet Take 1 tablet (2 mg total) by mouth 3 (three) times daily as needed for anxiety. 60 tablet 1  . azithromycin (ZITHROMAX) 250 MG tablet Take two once then one daily until gone 6 tablet 0  . Blood Glucose Monitoring Suppl (ONETOUCH VERIO) w/Device KIT Use as directed to check blood sugar up to 3 times daily. 1 kit 0  . clopidogrel (PLAVIX) 75 MG tablet Take 1 tablet (75 mg total) by mouth daily. 30 tablet 6  . furosemide (LASIX) 40 MG tablet Take 1 tablet (40 mg total) by mouth 2 (two) times daily. 60 tablet 6  . gabapentin (NEURONTIN) 400 MG capsule Take 1 capsule (400 mg total) by mouth 3 (three) times daily. 90 capsule 1  . glucose blood (ONETOUCH VERIO) test strip Use as instructed 100 each 11  . insulin aspart (NOVOLOG FLEXPEN) 100 UNIT/ML FlexPen Inject 50 units into the skin before breakfast and 50 units at bedtime 15 mL 6  . Insulin Pen Needle (PEN NEEDLES) 31G X 5 MM MISC 1 Units by Does not apply  route 2 (two) times daily. Use with novolog pens 90 each 0  . losartan (COZAAR) 50 MG tablet Take 1 tablet (50 mg total) by mouth daily. 30 tablet 11  . metFORMIN (GLUCOPHAGE) 500 MG tablet Take 1 tablet (500 mg total) by mouth 2 (two) times daily with a meal. 60 tablet 0  . nicotine polacrilex (NICOTINE MINI) 4 MG lozenge Take 1 lozenge (4 mg total) by mouth as needed for smoking cessation. 100 tablet 0  .  nitroGLYCERIN (NITROSTAT) 0.4 MG SL tablet Place 1 tablet (0.4 mg total) under the tongue every 5 (five) minutes as needed for chest pain. 50 tablet 3  . ONE TOUCH LANCETS MISC Use to check blood sugar 3 times daily. 100 each 11  . promethazine-dextromethorphan (PROMETHAZINE-DM) 6.25-15 MG/5ML syrup Take 5 mLs by mouth 4 (four) times daily as needed for cough. 118 mL 0  . traZODone (DESYREL) 100 MG tablet Take 1 tablet (100 mg total) by mouth at bedtime. 30 tablet 6   No facility-administered medications prior to visit.      Review of Systems      Objective:   Physical Exam   VSS Gen: Pleasant, obese, in no distress,  normal affect  Lungs: No use of accessory muscles, no dullness to percussion, distant BS, much improved  Cardiovascular: RRR, heart sounds normal, no murmur or gallops, no peripheral edema  Abdomen: soft and NT, no HSM,  BS normal  Musculoskeletal: No deformities, no cyanosis or clubbing  Neuro: alert, non focal  Skin: Warm, no lesions or rashes  No results found.     Office Visit from 11/11/2018 in [place]  PHQ-9 Total Score  9      HgA1C 11 11/06/18 BMP Latest Ref Rng & Units 11/08/2018 10/11/2018 10/27/2008  Glucose 70 - 99 mg/dL 284(H) 357(H) 177(H)  BUN 8 - 23 mg/dL 8 14 20   Creatinine 0.61 - 1.24 mg/dL 0.92 1.10 1.0  Sodium 135 - 145 mmol/L 137 137 140  Potassium 3.5 - 5.1 mmol/L 3.8 4.1 4.0  Chloride 98 - 111 mmol/L 102 100 108  CO2 22 - 32 mmol/L 24 23 -  Calcium 8.9 - 10.3 mg/dL 8.5(L) 8.5(L) -   CBC Latest  Ref Rng & Units 11/08/2018 10/11/2018 10/27/2008  WBC 4.0 - 10.5 K/uL 9.3 13.2(H) -  Hemoglobin 13.0 - 17.0 g/dL 14.3 14.0 16.7  Hematocrit 39.0 - 52.0 % 44.6 43.8 49.0  Platelets 150 - 400 K/uL 243 199 -   CBG (last 3)  489>>>384  11/08/18:  CXR NAD  1/10 ECG NSR  RBBB no acute change BNP 32  Trop 0.00 Assessment & Plan:

## 2018-12-09 ENCOUNTER — Other Ambulatory Visit: Payer: Self-pay

## 2018-12-09 ENCOUNTER — Encounter: Payer: Self-pay | Admitting: Critical Care Medicine

## 2018-12-09 ENCOUNTER — Telehealth: Payer: Self-pay | Admitting: General Practice

## 2018-12-09 ENCOUNTER — Ambulatory Visit: Payer: Medicare (Managed Care) | Attending: Critical Care Medicine | Admitting: Critical Care Medicine

## 2018-12-09 VITALS — BP 109/73 | HR 93 | Temp 98.2°F | Resp 24 | Wt 256.0 lb

## 2018-12-09 DIAGNOSIS — F329 Major depressive disorder, single episode, unspecified: Secondary | ICD-10-CM

## 2018-12-09 DIAGNOSIS — Z23 Encounter for immunization: Secondary | ICD-10-CM

## 2018-12-09 DIAGNOSIS — G894 Chronic pain syndrome: Secondary | ICD-10-CM

## 2018-12-09 DIAGNOSIS — E1140 Type 2 diabetes mellitus with diabetic neuropathy, unspecified: Secondary | ICD-10-CM

## 2018-12-09 DIAGNOSIS — E785 Hyperlipidemia, unspecified: Secondary | ICD-10-CM | POA: Insufficient documentation

## 2018-12-09 DIAGNOSIS — E1169 Type 2 diabetes mellitus with other specified complication: Secondary | ICD-10-CM | POA: Insufficient documentation

## 2018-12-09 DIAGNOSIS — Z79899 Other long term (current) drug therapy: Secondary | ICD-10-CM | POA: Insufficient documentation

## 2018-12-09 DIAGNOSIS — J208 Acute bronchitis due to other specified organisms: Secondary | ICD-10-CM | POA: Insufficient documentation

## 2018-12-09 DIAGNOSIS — J440 Chronic obstructive pulmonary disease with acute lower respiratory infection: Secondary | ICD-10-CM | POA: Insufficient documentation

## 2018-12-09 DIAGNOSIS — G8929 Other chronic pain: Secondary | ICD-10-CM

## 2018-12-09 DIAGNOSIS — F419 Anxiety disorder, unspecified: Secondary | ICD-10-CM | POA: Insufficient documentation

## 2018-12-09 DIAGNOSIS — E1142 Type 2 diabetes mellitus with diabetic polyneuropathy: Secondary | ICD-10-CM

## 2018-12-09 DIAGNOSIS — M25552 Pain in left hip: Secondary | ICD-10-CM | POA: Diagnosis not present

## 2018-12-09 DIAGNOSIS — Z955 Presence of coronary angioplasty implant and graft: Secondary | ICD-10-CM | POA: Insufficient documentation

## 2018-12-09 DIAGNOSIS — M549 Dorsalgia, unspecified: Secondary | ICD-10-CM | POA: Insufficient documentation

## 2018-12-09 DIAGNOSIS — E1165 Type 2 diabetes mellitus with hyperglycemia: Secondary | ICD-10-CM | POA: Insufficient documentation

## 2018-12-09 DIAGNOSIS — Z794 Long term (current) use of insulin: Secondary | ICD-10-CM | POA: Insufficient documentation

## 2018-12-09 DIAGNOSIS — M545 Low back pain, unspecified: Secondary | ICD-10-CM

## 2018-12-09 DIAGNOSIS — IMO0002 Reserved for concepts with insufficient information to code with codable children: Secondary | ICD-10-CM

## 2018-12-09 DIAGNOSIS — I25119 Atherosclerotic heart disease of native coronary artery with unspecified angina pectoris: Secondary | ICD-10-CM | POA: Insufficient documentation

## 2018-12-09 DIAGNOSIS — F17210 Nicotine dependence, cigarettes, uncomplicated: Secondary | ICD-10-CM | POA: Insufficient documentation

## 2018-12-09 DIAGNOSIS — I10 Essential (primary) hypertension: Secondary | ICD-10-CM

## 2018-12-09 DIAGNOSIS — I252 Old myocardial infarction: Secondary | ICD-10-CM | POA: Insufficient documentation

## 2018-12-09 DIAGNOSIS — Z7901 Long term (current) use of anticoagulants: Secondary | ICD-10-CM | POA: Insufficient documentation

## 2018-12-09 DIAGNOSIS — F32A Depression, unspecified: Secondary | ICD-10-CM

## 2018-12-09 DIAGNOSIS — F319 Bipolar disorder, unspecified: Secondary | ICD-10-CM | POA: Insufficient documentation

## 2018-12-09 DIAGNOSIS — Z1159 Encounter for screening for other viral diseases: Secondary | ICD-10-CM

## 2018-12-09 MED ORDER — TETANUS-DIPHTH-ACELL PERTUSSIS 5-2.5-18.5 LF-MCG/0.5 IM SUSP
0.5000 mL | Freq: Once | INTRAMUSCULAR | Status: AC
  Administered 2018-12-09: 0.5 mL via INTRAMUSCULAR

## 2018-12-09 MED ORDER — ALPRAZOLAM 2 MG PO TABS: 2.0000 mg | ORAL_TABLET | Freq: Three times a day (TID) | ORAL | 0 refills | Status: DC | PRN

## 2018-12-09 MED ORDER — NITROGLYCERIN 0.4 MG SL SUBL: 0.4000 mg | SUBLINGUAL_TABLET | SUBLINGUAL | 3 refills | Status: AC | PRN

## 2018-12-09 NOTE — Telephone Encounter (Signed)
We will see the patient now , he is a walkin

## 2018-12-09 NOTE — Assessment & Plan Note (Signed)
Chronic pain syndrome from neuropathy and lower spine disease with previous long-term use of opioids  Referral to the pain clinic was made today

## 2018-12-09 NOTE — Assessment & Plan Note (Signed)
Chronic diabetic neuropathy With associated chronic pain syndrome

## 2018-12-09 NOTE — Assessment & Plan Note (Addendum)
Poorly controlled type 2 diabetes now on insulin therapy  Note foot exam was normal at this visit

## 2018-12-09 NOTE — Patient Instructions (Signed)
Refill on the Xanax was sent to your [facility] pharmacy  Refill on the nitroglycerin tablets were sent to [facility] pharmacy  No other change in medications  A hepatitis C antibody lab test will be drawn today  We offered you the tetanus vaccine and flu vaccine today  A referral to the pain management clinic was sent in today and another referral to orthopedic surgery was sent as well please try to keep those appointments  Return to see Dr. Delford Field in 1 month in our clinic here and Dr. Delford Field  can see you at the [facility] clinic as needed

## 2018-12-09 NOTE — Assessment & Plan Note (Signed)
Hypertension under good control at this visit
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
## 2018-12-09 NOTE — Assessment & Plan Note (Signed)
Acute tracheobronchitis now resolved  
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
## 2018-12-09 NOTE — Assessment & Plan Note (Signed)
Chronic lower back pain without sciatica  Referral to orthopedic surgery was made
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
## 2018-12-10 LAB — HEPATITIS C ANTIBODY: Hep C Virus Ab: <0.1 s/co ratio (ref 0.0–0.9)

## 2018-12-11 ENCOUNTER — Telehealth: Payer: Self-pay | Admitting: Critical Care Medicine

## 2018-12-11 ENCOUNTER — Other Ambulatory Visit: Payer: Self-pay | Admitting: Critical Care Medicine

## 2018-12-11 DIAGNOSIS — M1A9XX0 Chronic gout, unspecified, without tophus (tophi): Secondary | ICD-10-CM

## 2018-12-11 NOTE — Telephone Encounter (Signed)
New Message  Pt calling, states Dr. Delford Field is suppose to send a referral to [facility] but the pt is requesting the referral be sent to [facility] for his Gout. Please f/u

## 2018-12-11 NOTE — Progress Notes (Signed)
Wants referral to Gout Clinic at [facility]

## 2018-12-12 ENCOUNTER — Ambulatory Visit (INDEPENDENT_AMBULATORY_CARE_PROVIDER_SITE_OTHER): Payer: Medicare (Managed Care) | Admitting: Family Medicine

## 2018-12-12 ENCOUNTER — Ambulatory Visit (INDEPENDENT_AMBULATORY_CARE_PROVIDER_SITE_OTHER): Payer: Medicare (Managed Care)

## 2018-12-12 ENCOUNTER — Encounter (INDEPENDENT_AMBULATORY_CARE_PROVIDER_SITE_OTHER): Payer: Self-pay | Admitting: Family Medicine

## 2018-12-12 DIAGNOSIS — M5441 Lumbago with sciatica, right side: Secondary | ICD-10-CM

## 2018-12-12 DIAGNOSIS — G8929 Other chronic pain: Secondary | ICD-10-CM | POA: Diagnosis not present

## 2018-12-12 DIAGNOSIS — M5442 Lumbago with sciatica, left side: Secondary | ICD-10-CM

## 2018-12-12 MED ORDER — HYDROCODONE-ACETAMINOPHEN 5-325 MG PO TABS: 1.0000 | ORAL_TABLET | Freq: Two times a day (BID) | ORAL | 0 refills | Status: DC | PRN

## 2018-12-12 MED ORDER — BACLOFEN 10 MG PO TABS: 10.0000 mg | ORAL_TABLET | Freq: Three times a day (TID) | ORAL | 1 refills | Status: DC | PRN
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
## 2018-12-12 NOTE — Telephone Encounter (Signed)
Eustace Pen, I changed referral to [facility]   I do not know the contact #

## 2018-12-18 NOTE — Telephone Encounter (Signed)
Antonio Sparks addressed the contact for the referral information and will send information to the new request instead.

## 2018-12-18 NOTE — Telephone Encounter (Signed)
Per Patient request  Sent referral to [facility]  [address] [place], [place] [address] [phone] Fax [phone]

## 2018-12-19 ENCOUNTER — Other Ambulatory Visit: Payer: Self-pay | Admitting: Critical Care Medicine

## 2018-12-19 NOTE — Telephone Encounter (Signed)
Thank you :)

## 2018-12-25 ENCOUNTER — Encounter: Payer: Self-pay | Admitting: Critical Care Medicine

## 2018-12-25 ENCOUNTER — Other Ambulatory Visit: Payer: Self-pay | Admitting: Critical Care Medicine

## 2018-12-25 MED ORDER — ALPRAZOLAM 2 MG PO TABS: 2.0000 mg | ORAL_TABLET | Freq: Three times a day (TID) | ORAL | 0 refills | Status: DC | PRN

## 2018-12-25 MED ORDER — INSULIN ASPART 100 UNIT/ML FLEXPEN: PEN_INJECTOR | SUBCUTANEOUS | 6 refills | Status: DC

## 2018-12-25 MED ORDER — GLUCOSE BLOOD VI STRP: ORAL_STRIP | 12 refills | Status: DC

## 2018-12-25 NOTE — Progress Notes (Signed)
Has gotten ID, getting license back, Application in place for housing  Gout referral in place   [facility] saw the pt   For pain clinic  Glucose meter went out.  Ultra one touch meter,  Using , needs strips.

## 2018-12-26 NOTE — Progress Notes (Signed)
This is a 68 year old male with diabetes and hypertension chronic pain disorder seen again in follow-up at the [facility].  The patient has run out of his glucose testing strips and his alprazolam.  Also he has been using an increased dose of his insulin using 50 units in the a.m. 30 units at lunch and 50 units in the evening.  The patient therefore is needing refills of these 3 items.  I sent to his local pharmacy refills on the ultrathin glucose testing strips, alprazolam 2 mg every 8 hours as needed dispense 60 Also I refilled NovoLog 50 units a.m. 30 units lunch and 50 units at bedtime  Note today in the office his glucose was at 300

## 2018-12-31 ENCOUNTER — Encounter

## 2018-12-31 ENCOUNTER — Ambulatory Visit (INDEPENDENT_AMBULATORY_CARE_PROVIDER_SITE_OTHER): Payer: Medicare (Managed Care) | Admitting: Physical Medicine and Rehabilitation

## 2018-12-31 ENCOUNTER — Telehealth (INDEPENDENT_AMBULATORY_CARE_PROVIDER_SITE_OTHER): Payer: Self-pay | Admitting: Physical Medicine and Rehabilitation

## 2018-12-31 ENCOUNTER — Encounter (INDEPENDENT_AMBULATORY_CARE_PROVIDER_SITE_OTHER): Payer: Self-pay | Admitting: Physical Medicine and Rehabilitation

## 2018-12-31 ENCOUNTER — Ambulatory Visit (INDEPENDENT_AMBULATORY_CARE_PROVIDER_SITE_OTHER): Payer: Self-pay

## 2018-12-31 VITALS — BP 137/83 | HR 85 | Temp 98.5°F

## 2018-12-31 DIAGNOSIS — M5416 Radiculopathy, lumbar region: Secondary | ICD-10-CM | POA: Diagnosis not present

## 2018-12-31 MED ORDER — BETAMETHASONE SOD PHOS & ACET 6 (3-3) MG/ML IJ SUSP
12.0000 mg | Freq: Once | INTRAMUSCULAR | Status: AC
  Administered 2018-12-31: 12 mg

## 2018-12-31 NOTE — Procedures (Signed)
Lumbosacral Transforaminal Epidural Steroid Injection - Sub-Pedicular Approach with Fluoroscopic Guidance  Patient: Antonio Sparks      Date of Birth: 16-Jul-1951 MRN: [id number] PCP: Patient, No Pcp Per      Visit Date: 12/31/2018   Universal Protocol:    Date/Time: 12/31/2018  Consent Given By: the patient  Position: PRONE  Additional Comments: Vital signs were monitored before and after the procedure. Patient was prepped and draped in the usual sterile fashion. The correct patient, procedure, and site was verified.   Injection Procedure Details:  Procedure Site One Meds Administered:  Meds ordered this encounter  Medications  . betamethasone acetate-betamethasone sodium phosphate (CELESTONE) injection 12 mg    Laterality: Bilateral  Location/Site:  L4-L5  Needle size: 22 G  Needle type: Spinal  Needle Placement: Transforaminal  Findings:    -Comments: Excellent flow of contrast along the nerve and into the epidural space.  Procedure Details: After squaring off the end-plates to get a true AP view, the C-arm was positioned so that an oblique view of the foramen as noted above was visualized. The target area is just inferior to the "nose of the scotty dog" or sub pedicular. The soft tissues overlying this structure were infiltrated with 2-3 ml. of 1% Lidocaine without Epinephrine.  The spinal needle was inserted toward the target using a "trajectory" view along the fluoroscope beam.  Under AP and lateral visualization, the needle was advanced so it did not puncture dura and was located close the 6 O'Clock position of the pedical in AP tracterory. Biplanar projections were used to confirm position. Aspiration was confirmed to be negative for CSF and/or blood. A 1-2 ml. volume of Isovue-250 was injected and flow of contrast was noted at each level. Radiographs were obtained for documentation purposes.   After attaining the desired flow of contrast documented above, a 0.5  to 1.0 ml test dose of 0.25% Marcaine was injected into each respective transforaminal space.  The patient was observed for 90 seconds post injection.  After no sensory deficits were reported, and normal lower extremity motor function was noted,   the above injectate was administered so that equal amounts of the injectate were placed at each foramen (level) into the transforaminal epidural space.   Additional Comments:  The patient tolerated the procedure well Dressing: 2 x 2 sterile gauze and Band-Aid    Post-procedure details: Patient was observed during the procedure. Post-procedure instructions were reviewed.  Patient left the clinic in stable condition.

## 2018-12-31 NOTE — Progress Notes (Signed)
   Numeric Pain Rating Scale and Functional Assessment Average Pain (8)   In the last MONTH (on 0-10 scale) has pain interfered with the following?  1. General activity like being  able to carry out your everyday physical activities such as walking, climbing stairs, carrying groceries, or moving a chair?  Rating(9)   +Driver, -BT, -Dye Allergies.  

## 2018-12-31 NOTE — Telephone Encounter (Signed)
No, I can't do that.  Needs to contact PCP.

## 2018-12-31 NOTE — Telephone Encounter (Signed)
He last got #10 of Norco 5-325 mg on 12/12/2018. Please advise.

## 2018-12-31 NOTE — Telephone Encounter (Signed)
I called and advised the patient. 

## 2019-01-01 ENCOUNTER — Encounter: Payer: Self-pay | Admitting: Critical Care Medicine

## 2019-01-01 ENCOUNTER — Other Ambulatory Visit: Payer: Self-pay | Admitting: Critical Care Medicine

## 2019-01-01 DIAGNOSIS — G894 Chronic pain syndrome: Secondary | ICD-10-CM

## 2019-01-01 NOTE — Progress Notes (Signed)
Coughing black phlegm.  Feels like is choking  Back injection has not helped so far.

## 2019-01-02 NOTE — Progress Notes (Signed)
This is a 68 year old male with diabetes and hypertension chronic pain disorder seen again in follow-up at the [facility].   The patient comes into the clinic today complaining of pain in the lower back and requesting we prescribe Percocet for him.  He had been in the orthopedic clinic and had injections of steroids in his lumbar spine performed yesterday on 12/31/2018.  The patient states he had significant pain with that injection however he actually states his pain is better today.  He is not yet been able to get into the pain clinic and is requesting we manage that.  I explained to the patient that that is out of scope for this practice and I am not able to do that.  I did go in and make another referral to the pain management clinic for further follow-up.

## 2019-01-07 ENCOUNTER — Ambulatory Visit: Payer: Medicare (Managed Care) | Attending: Critical Care Medicine | Admitting: Critical Care Medicine

## 2019-01-07 ENCOUNTER — Other Ambulatory Visit: Payer: Self-pay

## 2019-01-07 ENCOUNTER — Encounter: Payer: Self-pay | Admitting: Critical Care Medicine

## 2019-01-07 VITALS — BP 158/87 | HR 84 | Temp 98.0°F | Resp 18 | Ht 67.0 in | Wt 255.0 lb

## 2019-01-07 DIAGNOSIS — Z6839 Body mass index (BMI) 39.0-39.9, adult: Secondary | ICD-10-CM

## 2019-01-07 DIAGNOSIS — E1140 Type 2 diabetes mellitus with diabetic neuropathy, unspecified: Secondary | ICD-10-CM | POA: Diagnosis not present

## 2019-01-07 DIAGNOSIS — G894 Chronic pain syndrome: Secondary | ICD-10-CM

## 2019-01-07 DIAGNOSIS — F32A Depression, unspecified: Secondary | ICD-10-CM

## 2019-01-07 DIAGNOSIS — I10 Essential (primary) hypertension: Secondary | ICD-10-CM | POA: Diagnosis not present

## 2019-01-07 DIAGNOSIS — F329 Major depressive disorder, single episode, unspecified: Secondary | ICD-10-CM

## 2019-01-07 DIAGNOSIS — E1165 Type 2 diabetes mellitus with hyperglycemia: Secondary | ICD-10-CM | POA: Diagnosis not present

## 2019-01-07 DIAGNOSIS — E1142 Type 2 diabetes mellitus with diabetic polyneuropathy: Secondary | ICD-10-CM

## 2019-01-07 DIAGNOSIS — F17210 Nicotine dependence, cigarettes, uncomplicated: Secondary | ICD-10-CM

## 2019-01-07 DIAGNOSIS — E669 Obesity, unspecified: Secondary | ICD-10-CM

## 2019-01-07 DIAGNOSIS — IMO0002 Reserved for concepts with insufficient information to code with codable children: Secondary | ICD-10-CM

## 2019-01-07 DIAGNOSIS — Z794 Long term (current) use of insulin: Secondary | ICD-10-CM

## 2019-01-07 DIAGNOSIS — Z590 Homelessness: Secondary | ICD-10-CM

## 2019-01-07 DIAGNOSIS — F319 Bipolar disorder, unspecified: Secondary | ICD-10-CM

## 2019-01-07 DIAGNOSIS — F419 Anxiety disorder, unspecified: Secondary | ICD-10-CM

## 2019-01-07 LAB — HEMOGLOBIN A1C: A1c: 10.5

## 2019-01-07 LAB — GLUCOSE, POCT (MANUAL RESULT ENTRY): POC Glucose: 266 mg/dl — AB (ref 70–99)

## 2019-01-07 MED ORDER — GLUCOSE BLOOD VI STRP: ORAL_STRIP | 12 refills | Status: DC

## 2019-01-07 MED ORDER — ALPRAZOLAM 2 MG PO TABS: 2.0000 mg | ORAL_TABLET | Freq: Three times a day (TID) | ORAL | 0 refills | Status: DC | PRN

## 2019-01-07 MED ORDER — INSULIN ASPART 100 UNIT/ML FLEXPEN: PEN_INJECTOR | SUBCUTANEOUS | 6 refills | Status: DC

## 2019-01-07 MED FILL — NOVOLOG FLEXPEN SYRINGE: 100 | 23 days supply | Qty: 30 | Fill #0

## 2019-01-07 NOTE — Progress Notes (Signed)
Subjective:    Patient ID: Antonio Sparks, male    DOB: [birth year], 68 y.o.   MRN: [id number]  68 y.o. M    this patient is currently staying in a homeless shelter.  The patient arrived 10 days ago from [place] where he had been seen in a free clinic.  The patient has a longstanding history of COPD, coronary disease with previous stent placements in [place] 4 years ago, hypertension, bipolar disorder with significant anxiety, type 2 diabetes with hyperlipidemia, and chronic pain syndrome from musculoskeletal deformities  The patient also has neuropathy in the lower extremities from uncontrolled diabetes  10/18/2018 at the last visit the patient presented with anginal symptoms, and due to lack of pre-existing database we sent the patient to the emergency room the same day.  Evaluation there showed no evidence of acute coronary syndrome with negative troponins.  The patient did receive refills on his diabetic and hypertensive medications.  The patient presents today for post emergency room follow-up.  The patient now notes a productive cough of thick yellow mucus.  The patient is still actively smoking.  Patient states he is attempting to get back into the cardiology clinic at [facility].  He continues to live in a homeless shelter.   11/11/18 The patient is seen today in the clinic and was last seen at the homeless shelter clinic on 11/06/2018.  At that time we thought the patient had coverage to receive his prescriptions at the local [facility].  However it turns out the patient's Medicare prescription coverage is limited and has provided for the patient very significantly high co-pay dollar amount.  He was not able to obtain the antibiotics or cough medicine for the last week.  He was able to obtain the nitroglycerin tablets.  He ended up going to the emergency room on 11/08/2018 for chest pain and back pain.  He was not given any narcotics his evaluation was unremarkable for any  acute process.  His troponins were 0 and his EKG was unremarkable.  He did have a chest x-ray which showed no active process.  Today he is persistent and requesting for opiates.  He does relate that the Lyrica we sent to his local pharmacy last week was not picked up as he did not have the ability to afford the co-pay.  He has been on gabapentin in the past with some relief.  He is requesting orthopedics and pain management referrals.   11/27/2018  The patient was seen today in the [place] homeless shelter clinic.  Last week we sent prescriptions in for benzonatate and insulin as he had lost his insulin prescription he has not could obtain these medicines from the pharmacy   The patient also was receiving treatment for tracheobronchitis and this is slowly improved.  He has minimal cough now at this time.  There are been no other changes since the last visit.  This was a check-in and he has a follow-up appointment with me in my clinic on February 3.  01/07/2019 The patient is seen in follow-up.  He now is obtaining a new Medicare advantage plan.  History of type 2 diabetes, coronary disease, hypertension, bipolar, chronic back pain.  Patient's level of dyspnea is about the same.  He is no longer smoking.  He is not been compliant with the insulin as he has run out of his insulin the past 4 days.  He is needing refills on his insulin testing strips and insulin and Xanax.  He  is in the process of moving out of the homeless shelter at this time.  The patient has been seen by orthopedics and had injections done of the lower spine.  He has an outstanding pain medicine clinic referral that is pending.    Past Medical History:  Diagnosis Date  . Bipolar disorder ([place])   . Myocardial infarct [facility])      History reviewed. No pertinent family history.   Social History   Socioeconomic History  . Marital status: Divorced    Spouse name: Not on file  . Number of children: Not on file  . Years of  education: Not on file  . Highest education level: Not on file  Occupational History  . Not on file  Social Needs  . Financial resource strain: Not on file  . Food insecurity:    Worry: Not on file    Inability: Not on file  . Transportation needs:    Medical: Not on file    Non-medical: Not on file  Tobacco Use  . Smoking status: Current Every Day Smoker    Packs/day: 0.25    Types: Cigarettes  . Smokeless tobacco: Never Used  Substance and Sexual Activity  . Alcohol use: Not Currently  . Drug use: Not Currently  . Sexual activity: Not Currently  Lifestyle  . Physical activity:    Days per week: Not on file    Minutes per session: Not on file  . Stress: Not on file  Relationships  . Social connections:    Talks on phone: Not on file    Gets together: Not on file    Attends religious service: Not on file    Active member of club or organization: Not on file    Attends meetings of clubs or organizations: Not on file    Relationship status: Not on file  . Intimate partner violence:    Fear of current or ex partner: Not on file    Emotionally abused: Not on file    Physically abused: Not on file    Forced sexual activity: Not on file  Other Topics Concern  . Not on file  Social History Narrative  . Not on file     Allergies  Allergen Reactions  . Bee Venom Anaphylaxis  . Codeine Nausea And Vomiting and Rash  . Iodine Nausea And Vomiting     Outpatient Medications Prior to Visit  Medication Sig Dispense Refill  . albuterol (PROVENTIL HFA;VENTOLIN HFA) 108 (90 Base) MCG/ACT inhaler Inhale 1 puff into the lungs every 6 (six) hours as needed for wheezing or shortness of breath. 1 Inhaler 0  . baclofen (LIORESAL) 10 MG tablet Take 1 tablet (10 mg total) by mouth 3 (three) times daily as needed for muscle spasms. 90 each 1  . Blood Glucose Monitoring Suppl (ONETOUCH VERIO) w/Device KIT Use as directed to check blood sugar up to 3 times daily. 1 kit 0  . clopidogrel  (PLAVIX) 75 MG tablet Take 1 tablet (75 mg total) by mouth daily. 30 tablet 6  . furosemide (LASIX) 40 MG tablet Take 1 tablet (40 mg total) by mouth 2 (two) times daily. 60 tablet 6  . gabapentin (NEURONTIN) 400 MG capsule Take 1 capsule (400 mg total) by mouth 3 (three) times daily. 90 capsule 1  . Insulin Pen Needle (PEN NEEDLES) 31G X 5 MM MISC 1 Units by Does not apply route 2 (two) times daily. Use with novolog pens 90 each 0  . losartan (  COZAAR) 50 MG tablet Take 1 tablet (50 mg total) by mouth daily. 30 tablet 11  . metFORMIN (GLUCOPHAGE) 500 MG tablet Take 1 tablet (500 mg total) by mouth 2 (two) times daily with a meal. 60 tablet 0  . nitroGLYCERIN (NITROSTAT) 0.4 MG SL tablet Place 1 tablet (0.4 mg total) under the tongue every 5 (five) minutes as needed for chest pain. 50 tablet 3  . ONE TOUCH LANCETS MISC Use to check blood sugar 3 times daily. 100 each 11  . traZODone (DESYREL) 100 MG tablet Take 1 tablet (100 mg total) by mouth at bedtime. 30 tablet 6  . ULTICARE INSULIN SYRINGE 30G X 1/2" 0.5 ML MISC Inject 1 Syringe into the skin 2 (two) times daily as needed.    Marland Kitchen alprazolam (XANAX) 2 MG tablet Take 1 tablet (2 mg total) by mouth 3 (three) times daily as needed for anxiety. 60 tablet 0  . glucose blood test strip Use as instructed 120 each 12  . HYDROcodone-acetaminophen (NORCO/VICODIN) 5-325 MG tablet Take 1 tablet by mouth 2 (two) times daily as needed for moderate pain. 10 tablet 0  . insulin aspart (NOVOLOG FLEXPEN) 100 UNIT/ML FlexPen Inject 50 units into the skin before breakfast and 50 units at bedtime, 30 units at lunch 30 mL 6  . oxyCODONE-acetaminophen (PERCOCET/ROXICET) 5-325 MG tablet Take 1 tablet by mouth 3 (three) times daily as needed.    . promethazine-dextromethorphan (PROMETHAZINE-DM) 6.25-15 MG/5ML syrup Take 5 mLs by mouth 4 (four) times daily as needed for cough. (Patient not taking: Reported on 01/07/2019) 118 mL 0   No facility-administered medications prior  to visit.      Review of Systems  Constitutional: Positive for activity change. Negative for fever.  HENT: Negative.   Eyes: Negative.   Respiratory: Negative.   Cardiovascular: Positive for leg swelling. Negative for chest pain.  Endocrine: Negative.   Genitourinary: Negative.   Musculoskeletal: Positive for back pain, gait problem and myalgias.       Left hip pain   Psychiatric/Behavioral: The patient is nervous/anxious.        Objective:   Physical Exam  BP (!) 158/87 (BP Location: Left Arm, Patient Position: Sitting, Cuff Size: Normal)   Pulse 84   Temp 98 F (36.7 C) (Oral)   Resp 18   Ht 5' 7"  (1.702 m)   Wt 255 lb (115.7 kg)   SpO2 98%   BMI 39.94 kg/m   Gen: obese, anxious affect  Lungs: No use of accessory muscles, no dullness to percussion, distant BS, much improved  Cardiovascular: RRR, heart sounds normal, no murmur or gallops, 1+ peripheral edema  Abdomen: soft and NT, no HSM,  BS normal  Musculoskeletal: No deformities, no cyanosis or clubbing Tender over Left gluteal region,  No deformity, full ROM of L hip.  No bruising  Neuro: alert, non focal  Skin: Warm, no lesions or rashes  Hemoglobin A1c was 10.5 which is down by 0.5 from January 2020  Glucose is 266    Office Visit from 11/11/2018 in [facility]  PHQ-9 Total Score  9       BMP Latest Ref Rng & Units 11/08/2018 10/11/2018 10/27/2008  Glucose 70 - 99 mg/dL 284(H) 357(H) 177(H)  BUN 8 - 23 mg/dL 8 14 20   Creatinine 0.61 - 1.24 mg/dL 0.92 1.10 1.0  Sodium 135 - 145 mmol/L 137 137 140  Potassium 3.5 - 5.1 mmol/L 3.8 4.1 4.0  Chloride 98 -  111 mmol/L 102 100 108  CO2 22 - 32 mmol/L 24 23 -  Calcium 8.9 - 10.3 mg/dL 8.5(L) 8.5(L) -   CBC Latest Ref Rng & Units 11/08/2018 10/11/2018 10/27/2008  WBC 4.0 - 10.5 K/uL 9.3 13.2(H) -  Hemoglobin 13.0 - 17.0 g/dL 14.3 14.0 16.7  Hematocrit 39.0 - 52.0 % 44.6 43.8 49.0  Platelets 150 - 400 K/uL 243 199 -    Assessment & Plan:  I personally reviewed all images and lab data in the [facility] system as well as any outside material available during this office visit and agree with the  radiology impressions.   HTN (hypertension) Hypertension is modestly controlled and the patient is in quite a bit of stress at this time as he is transitioning out of the homeless shelter  We will continue current antihypertensive program  DM2 (diabetes mellitus, type 2) ([place]) Uncontrolled type 2 diabetes with associated neuropathy and hyperlipidemia.  Will refill insulin at current dose level 3 times daily and maintain metformin  Uncontrolled type 2 diabetes mellitus with diabetic neuropathy ([place]) Neuropathy continues to be poorly controlled we did discuss switching back to Lyrica once he obtains his new insurance  Anxiety and depression Significant anxiety and depression  Have refilled his Xanax at 2 mg every 8 hours with a plan to gradually titrate this dose down  Also have made a referral to our licensed clinical social worker for counseling  Chronic pain Pain management referral has been made  I have told the patient I cannot prescribe opiates   Horst was seen today for follow-up.  Diagnoses and all orders for this visit:  Uncontrolled type 2 diabetes mellitus with diabetic neuropathy (HCC) -     Glucose (CBG) -     Hemoglobin A1c  Anxiety and depression  Bipolar 1 disorder (HCC)  Chronic pain syndrome  Essential hypertension  Type 2 diabetes mellitus with diabetic polyneuropathy, with long-term current use of insulin (HCC)  Other orders -     Discontinue: glucose blood test strip; Use as instructed -     insulin aspart (NOVOLOG FLEXPEN) 100 UNIT/ML FlexPen; Inject 50 units into the skin before breakfast and 50 units at bedtime, 30 units at lunch -     alprazolam (XANAX) 2 MG tablet; Take 1 tablet (2 mg total) by mouth 3 (three) times daily as needed for anxiety.   We administered Tdap  and Flu vaccine today. Hep C ab drawn

## 2019-01-07 NOTE — Assessment & Plan Note (Signed)
Hypertension is modestly controlled and the patient is in quite a bit of stress at this time as he is transitioning out of the homeless shelter  We will continue current antihypertensive program

## 2019-01-07 NOTE — Assessment & Plan Note (Signed)
Pain management referral has been made  I have told the patient I cannot prescribe opiates

## 2019-01-07 NOTE — Assessment & Plan Note (Signed)
Neuropathy continues to be poorly controlled we did discuss switching back to Lyrica once he obtains his new insurance

## 2019-01-07 NOTE — Assessment & Plan Note (Signed)
Uncontrolled type 2 diabetes with associated neuropathy and hyperlipidemia.  Will refill insulin at current dose level 3 times daily and maintain metformin

## 2019-01-07 NOTE — Assessment & Plan Note (Signed)
Significant anxiety and depression  Have refilled his Xanax at 2 mg every 8 hours with a plan to gradually titrate this dose down  Also have made a referral to our licensed clinical social worker for counseling

## 2019-01-07 NOTE — Patient Instructions (Signed)
Refills on your insulin were sent to our pharmacy along with the testing strips  The Xanax refill was sent to the [facility] pharmacy   No change in medications at this time   Return follow-up 6 weeks

## 2019-01-08 ENCOUNTER — Other Ambulatory Visit: Payer: Self-pay

## 2019-01-08 DIAGNOSIS — E1140 Type 2 diabetes mellitus with diabetic neuropathy, unspecified: Secondary | ICD-10-CM

## 2019-01-08 DIAGNOSIS — E1165 Type 2 diabetes mellitus with hyperglycemia: Principal | ICD-10-CM

## 2019-01-08 DIAGNOSIS — IMO0002 Reserved for concepts with insufficient information to code with codable children: Secondary | ICD-10-CM

## 2019-01-13 ENCOUNTER — Other Ambulatory Visit: Payer: Self-pay | Admitting: Critical Care Medicine

## 2019-01-13 ENCOUNTER — Telehealth: Payer: Self-pay

## 2019-01-13 DIAGNOSIS — G894 Chronic pain syndrome: Secondary | ICD-10-CM

## 2019-01-13 NOTE — Telephone Encounter (Signed)
Call received from [facility].  Clarified ICD 10 diagnoses codes as well as frequency of blood glucose testing and  A1c noting that the patient is on insulin.

## 2019-01-13 NOTE — Progress Notes (Signed)
Referral to pain management sent.

## 2019-01-21 NOTE — Progress Notes (Signed)
Antonio Sparks - 68 y.o. male MRN [id number]  Date of birth: [birth year]  Office Visit Note: Visit Date: 12/31/2018 PCP: Patient, No Pcp Per Referred by: Lavada Mesi, MD  Subjective: Chief Complaint  Patient presents with  . Lower Back - Pain  . Right Leg - Pain  . Left Leg - Pain   HPI: Antonio Sparks is a 68 y.o. male who comes in today At the request of Dr. Lavada Mesi for lumbar epidural injection.  Patient with remote history of injections in the past with good relief.  He is having bilateral low back and bilateral leg pain.  With his agents likely stenosis at the L4-5 level which would go along with the x-rays.  If he does not get relief then MRI of the lumbar spine would be in order.  He is trying to get into pain management and I would defer pain medications to Dr. Prince Rome at this point.  ROS Otherwise per HPI.  Assessment & Plan: Visit Diagnoses:  1. Lumbar radiculopathy     Plan: No additional findings.   Meds & Orders:  Meds ordered this encounter  Medications  . betamethasone acetate-betamethasone sodium phosphate (CELESTONE) injection 12 mg    Orders Placed This Encounter  Procedures  . XR C-ARM NO REPORT  . Epidural Steroid injection    Follow-up: No follow-ups on file.   Procedures: No procedures performed  Lumbosacral Transforaminal Epidural Steroid Injection - Sub-Pedicular Approach with Fluoroscopic Guidance  Patient: Antonio Sparks      Date of Birth: 1951/06/07 MRN: [id number] PCP: Patient, No Pcp Per      Visit Date: 12/31/2018   Universal Protocol:    Date/Time: 12/31/2018  Consent Given By: the patient  Position: PRONE  Additional Comments: Vital signs were monitored before and after the procedure. Patient was prepped and draped in the usual sterile fashion. The correct patient, procedure, and site was verified.   Injection Procedure Details:  Procedure Site One Meds Administered:  Meds ordered this encounter  Medications  .  betamethasone acetate-betamethasone sodium phosphate (CELESTONE) injection 12 mg    Laterality: Bilateral  Location/Site:  L4-L5  Needle size: 22 G  Needle type: Spinal  Needle Placement: Transforaminal  Findings:    -Comments: Excellent flow of contrast along the nerve and into the epidural space.  Procedure Details: After squaring off the end-plates to get a true AP view, the C-arm was positioned so that an oblique view of the foramen as noted above was visualized. The target area is just inferior to the "nose of the scotty dog" or sub pedicular. The soft tissues overlying this structure were infiltrated with 2-3 ml. of 1% Lidocaine without Epinephrine.  The spinal needle was inserted toward the target using a "trajectory" view along the fluoroscope beam.  Under AP and lateral visualization, the needle was advanced so it did not puncture dura and was located close the 6 O'Clock position of the pedical in AP tracterory. Biplanar projections were used to confirm position. Aspiration was confirmed to be negative for CSF and/or blood. A 1-2 ml. volume of Isovue-250 was injected and flow of contrast was noted at each level. Radiographs were obtained for documentation purposes.   After attaining the desired flow of contrast documented above, a 0.5 to 1.0 ml test dose of 0.25% Marcaine was injected into each respective transforaminal space.  The patient was observed for 90 seconds post injection.  After no sensory deficits were reported, and normal lower extremity motor function was noted,  the above injectate was administered so that equal amounts of the injectate were placed at each foramen (level) into the transforaminal epidural space.   Additional Comments:  The patient tolerated the procedure well Dressing: 2 x 2 sterile gauze and Band-Aid    Post-procedure details: Patient was observed during the procedure. Post-procedure instructions were reviewed.  Patient left the clinic in  stable condition.    Clinical History: No specialty comments available.   He reports that he has been smoking cigarettes. He has been smoking about 0.25 packs per day. He has never used smokeless tobacco.  Recent Labs    10/11/18 1022  HGBA1C 10.9*    Objective:  VS:  HT:    WT:   BMI:     BP:137/83  HR:85bpm  TEMP:98.5 F (36.9 C)(Oral)  RESP:  Physical Exam  Ortho Exam Imaging: No results found.  Past Medical/Family/Surgical/Social History: Medications & Allergies reviewed per EMR, new medications updated. Patient Active Problem List   Diagnosis Date Noted  . Chronic bilateral low back pain without sciatica 11/11/2018  . Uncontrolled type 2 diabetes mellitus with diabetic neuropathy (HCC) 10/18/2018  . Anxiety and depression 10/18/2018  . [name] (coronary artery disease) 10/11/2018  . HTN (hypertension) 10/11/2018  . Hyperlipidemia associated with type 2 diabetes mellitus (HCC) 10/11/2018  . DM2 (diabetes mellitus, type 2) (HCC) 10/11/2018  . Bipolar 1 disorder (HCC) 10/11/2018  . Chronic pain 10/11/2018   Past Medical History:  Diagnosis Date  . Bipolar disorder (HCC)   . Myocardial infarct [facility])    History reviewed. No pertinent family history. History reviewed. No pertinent surgical history. Social History   Occupational History  . Not on file  Tobacco Use  . Smoking status: Current Every Day Smoker    Packs/day: 0.25    Types: Cigarettes  . Smokeless tobacco: Never Used  Substance and Sexual Activity  . Alcohol use: Not Currently  . Drug use: Not Currently  . Sexual activity: Not Currently

## 2019-01-22 ENCOUNTER — Other Ambulatory Visit: Payer: Self-pay | Admitting: Critical Care Medicine

## 2019-01-22 MED ORDER — FLUTICASONE PROPIONATE 50 MCG/ACT NA SUSP: 2.0000 | Freq: Every day | NASAL | 6 refills | Status: DC

## 2019-01-22 MED ORDER — AZITHROMYCIN 250 MG PO TABS: 250.0000 mg | ORAL_TABLET | Freq: Every day | ORAL | 0 refills | Status: DC

## 2019-01-23 ENCOUNTER — Other Ambulatory Visit: Payer: Self-pay | Admitting: Critical Care Medicine

## 2019-01-23 ENCOUNTER — Encounter: Payer: Self-pay | Admitting: Critical Care Medicine

## 2019-01-23 NOTE — Progress Notes (Signed)
Patient ID: Tannon Mirro, male   DOB: 05/17/1951, 68 y.o.   MRN: [id number]  This is a 68 year old male who I have seen frequently both here at the [place] clinic and also in the [facility].  The patient comes in today complaining of sore throat and itchiness and scratching in the throat with postnasal drainage.  He is coughing up thin green mucus.  The mucus out of the nose is clear.  He notes some increased wheezing.  Patient does have a history of COPD, diabetes type 2, coronary disease, and chronic anxiety along with chronic pain syndrome.  On exam temp is 97 saturation 96% room air pulse is 88  Chest showed a few expiratory wheezes Cardiac exam showed a regular rate and rhythm at S3 normal S1 is 2 HEENT exam revealed mild purulence in the nares and postnasal drainage in the oropharynx  Impression is that of mild acute tracheobronchitis with allergic rhinitis  Plan will be to administer Flonase 2 sprays each nostril daily and administer azithromycin for a 5-day course

## 2019-01-30 ENCOUNTER — Other Ambulatory Visit: Payer: Self-pay | Admitting: Critical Care Medicine

## 2019-01-30 ENCOUNTER — Telehealth: Payer: Self-pay | Admitting: General Practice

## 2019-01-30 MED ORDER — ALBUTEROL SULFATE HFA 108 (90 BASE) MCG/ACT IN AERS: 1.0000 | INHALATION_SPRAY | Freq: Four times a day (QID) | RESPIRATORY_TRACT | 0 refills | Status: DC | PRN

## 2019-01-30 MED ORDER — ALPRAZOLAM 2 MG PO TABS: 2.0000 mg | ORAL_TABLET | Freq: Three times a day (TID) | ORAL | 0 refills | Status: DC | PRN

## 2019-01-30 NOTE — Telephone Encounter (Signed)
Refills sent to pharmacy. 

## 2019-01-30 NOTE — Telephone Encounter (Signed)
New Message   1) Medication(s) Requested (by name): alprazolam (XANAX) 2 MG tablet and albuterol (PROVENTIL HFA;VENTOLIN HFA) 108 (90 Base) MCG/ACT inhaler   2) Pharmacy of Choice:  [id number] - [place], [place] - [address] AT [facility] OF [address]  3) Special Requests:   Approved medications will be sent to the pharmacy, we will reach out if there is an issue.  Requests made after 3pm may not be addressed until the following business day!  If a patient is unsure of the name of the medication(s) please note and ask patient to call back when they are able to provide all info, do not send to responsible party until all information is available!

## 2019-02-14 ENCOUNTER — Other Ambulatory Visit: Payer: Self-pay | Admitting: Critical Care Medicine

## 2019-02-14 MED ORDER — ALPRAZOLAM 2 MG PO TABS: 2.0000 mg | ORAL_TABLET | Freq: Three times a day (TID) | ORAL | 0 refills | Status: DC | PRN

## 2019-02-14 MED ORDER — GABAPENTIN 400 MG PO CAPS: 400.0000 mg | ORAL_CAPSULE | Freq: Three times a day (TID) | ORAL | 1 refills | Status: DC

## 2019-02-14 NOTE — Progress Notes (Signed)
Refills on xanax and gabapentin sent to [facility] pharmacy

## 2019-02-17 NOTE — Progress Notes (Deleted)
Subjective:    Patient ID: Antonio Sparks, male    DOB: 1951/05/03, 68 y.o.   MRN: [id number]  Virtual Visit via Telephone Note  I connected with Antonio Sparks on 02/17/19 at 10:50 AM EDT by telephone and verified that I am speaking with the correct person using two identifiers.   Consent:  I discussed the limitations, risks, security and privacy concerns of performing an evaluation and management service by telephone and the availability of in person appointments. I also discussed with the patient that there may be a patient responsible charge related to this service. The patient expressed understanding and agreed to proceed.  Location of patient:  Location of provider:  Persons participating in the televisit with the patient.       History of Present Illness: 68 y.o. M    this patient is currently staying in a homeless shelter.  The patient arrived 10 days ago from [place] where he had been seen in a free clinic.  The patient has a longstanding history of COPD, coronary disease with previous stent placements in [place] 4 years ago, hypertension, bipolar disorder with significant anxiety, type 2 diabetes with hyperlipidemia, and chronic pain syndrome from musculoskeletal deformities  The patient also has neuropathy in the lower extremities from uncontrolled diabetes  10/18/2018 at the last visit the patient presented with anginal symptoms, and due to lack of pre-existing database we sent the patient to the emergency room the same day.  Evaluation there showed no evidence of acute coronary syndrome with negative troponins.  The patient did receive refills on his diabetic and hypertensive medications.  The patient presents today for post emergency room follow-up.  The patient now notes a productive cough of thick yellow mucus.  The patient is still actively smoking.  Patient states he is attempting to get back into the cardiology clinic at [facility].  He  continues to live in a homeless shelter.   11/11/18 The patient is seen today in the clinic and was last seen at the homeless shelter clinic on 11/06/2018.  At that time we thought the patient had coverage to receive his prescriptions at the local [facility].  However it turns out the patient's Medicare prescription coverage is limited and has provided for the patient very significantly high co-pay dollar amount.  He was not able to obtain the antibiotics or cough medicine for the last week.  He was able to obtain the nitroglycerin tablets.  He ended up going to the emergency room on 11/08/2018 for chest pain and back pain.  He was not given any narcotics his evaluation was unremarkable for any acute process.  His troponins were 0 and his EKG was unremarkable.  He did have a chest x-ray which showed no active process.  Today he is persistent and requesting for opiates.  He does relate that the Lyrica we sent to his local pharmacy last week was not picked up as he did not have the ability to afford the co-pay.  He has been on gabapentin in the past with some relief.  He is requesting orthopedics and pain management referrals.   11/27/2018  The patient was seen today in the [place] homeless shelter clinic.  Last week we sent prescriptions in for benzonatate and insulin as he had lost his insulin prescription he has not could obtain these medicines from the pharmacy   The patient also was receiving treatment for tracheobronchitis and this is slowly improved.  He has minimal cough now at this  time.  There are been no other changes since the last visit.  This was a check-in and he has a follow-up appointment with me in my clinic on February 3.  01/07/2019 The patient is seen in follow-up.  He now is obtaining a new Medicare advantage plan.  History of type 2 diabetes, coronary disease, hypertension, bipolar, chronic back pain.  Patient's level of dyspnea is about the same.  He is no longer smoking.   He is not been compliant with the insulin as he has run out of his insulin the past 4 days.  He is needing refills on his insulin testing strips and insulin and Xanax.  He is in the process of moving out of the homeless shelter at this time.  The patient has been seen by orthopedics and had injections done of the lower spine.  He has an outstanding pain medicine clinic referral that is pending.  02/18/2019   Review of Systems  Constitutional: Positive for activity change. Negative for fever.  HENT: Negative.   Eyes: Negative.   Respiratory: Negative.   Cardiovascular: Positive for leg swelling. Negative for chest pain.  Endocrine: Negative.   Genitourinary: Negative.   Musculoskeletal: Positive for back pain, gait problem and myalgias.       Left hip pain   Psychiatric/Behavioral: The patient is nervous/anxious.       Observations/Objective:     Office Visit from 11/11/2018 in [facility]  PHQ-9 Total Score  9       BMP Latest Ref Rng & Units 11/08/2018 10/11/2018 10/27/2008  Glucose 70 - 99 mg/dL 482(N) 003(B) 048(G)  BUN 8 - 23 mg/dL 8 14 20   Creatinine 0.61 - 1.24 mg/dL 8.91 6.94 1.0  Sodium 503 - 145 mmol/L 137 137 140  Potassium 3.5 - 5.1 mmol/L 3.8 4.1 4.0  Chloride 98 - 111 mmol/L 102 100 108  CO2 22 - 32 mmol/L 24 23 -  Calcium 8.9 - 10.3 mg/dL 8.8(E) 2.8(M) -   CBC Latest Ref Rng & Units 11/08/2018 10/11/2018 10/27/2008  WBC 4.0 - 10.5 K/uL 9.3 13.2(H) -  Hemoglobin 13.0 - 17.0 g/dL 03.4 91.7 91.5  Hematocrit 39.0 - 52.0 % 44.6 43.8 49.0  Platelets 150 - 400 K/uL 243 199 -     Assessment and Plan:   Follow Up Instructions:    I discussed the assessment and treatment plan with the patient. The patient was provided an opportunity to ask questions and all were answered. The patient agreed with the plan and demonstrated an understanding of the instructions.   The patient was advised to call back or seek an in-person  evaluation if the symptoms worsen or if the condition fails to improve as anticipated.  I provided *** minutes of non-face-to-face time during this encounter  including  median intraservice time , review of notes, labs, imaging, medications  and explaining diagnosis and management to the patient .    Shan Levans, MD

## 2019-02-18 ENCOUNTER — Encounter: Payer: Self-pay | Admitting: Critical Care Medicine

## 2019-02-18 ENCOUNTER — Other Ambulatory Visit: Payer: Self-pay

## 2019-02-18 ENCOUNTER — Ambulatory Visit: Payer: Medicare Other | Attending: Critical Care Medicine | Admitting: Critical Care Medicine

## 2019-02-18 NOTE — Progress Notes (Addendum)
Called patient to initiate their telephone visit with provider Dr. Delford Field. Call went to voicemail. Left message stating that I was trying to initiate his telephone visit at 11:09 AM on 02/18/2019. KWalker, CMA.  Called patient to initiate their telephone visit with provider Dr. Delford Field. Call went to voicemail. Left message stating that I was trying to initiate his telephone visit at 11:289 AM on 02/18/2019. KWalker, CMA.

## 2019-02-20 ENCOUNTER — Telehealth (HOSPITAL_BASED_OUTPATIENT_CLINIC_OR_DEPARTMENT_OTHER): Payer: Medicare Other | Admitting: Critical Care Medicine

## 2019-02-20 DIAGNOSIS — Z760 Encounter for issue of repeat prescription: Secondary | ICD-10-CM | POA: Diagnosis not present

## 2019-02-20 DIAGNOSIS — Z590 Homelessness: Secondary | ICD-10-CM

## 2019-02-20 DIAGNOSIS — F419 Anxiety disorder, unspecified: Secondary | ICD-10-CM

## 2019-02-20 DIAGNOSIS — E119 Type 2 diabetes mellitus without complications: Secondary | ICD-10-CM | POA: Diagnosis not present

## 2019-02-20 DIAGNOSIS — J449 Chronic obstructive pulmonary disease, unspecified: Secondary | ICD-10-CM

## 2019-02-20 DIAGNOSIS — R05 Cough: Secondary | ICD-10-CM | POA: Diagnosis not present

## 2019-02-20 DIAGNOSIS — G894 Chronic pain syndrome: Secondary | ICD-10-CM

## 2019-02-20 MED ORDER — PROMETHAZINE-DM 6.25-15 MG/5ML PO SYRP: 5.0000 mL | ORAL_SOLUTION | Freq: Four times a day (QID) | ORAL | 0 refills | Status: DC | PRN

## 2019-02-20 MED ORDER — INSULIN LISPRO (1 UNIT DIAL) 100 UNIT/ML (KWIKPEN): PEN_INJECTOR | SUBCUTANEOUS | 11 refills | Status: DC

## 2019-02-20 MED ORDER — PEN NEEDLES 31G X 5 MM MISC: 1.0000 [IU] | Freq: Three times a day (TID) | 3 refills | Status: DC

## 2019-02-20 MED ORDER — ALBUTEROL SULFATE HFA 108 (90 BASE) MCG/ACT IN AERS: 1.0000 | INHALATION_SPRAY | Freq: Four times a day (QID) | RESPIRATORY_TRACT | 1 refills | Status: AC | PRN

## 2019-02-20 NOTE — Telephone Encounter (Signed)
Patient ID: Antonio Sparks, male   DOB: 05/10/1951, 68 y.o.   MRN: [id number] This was very brief 5-minute phone call with this 68 year old male with associated COPD, type 2 diabetes, coronary disease, chronic anxiety, chronic pain syndrome.  The patient is in need of refills on insulin and has new insurance.  Note the patient just received refills on Xanax and Neurontin.  The patient is still in a homeless shelter but is hoping to achieve housing.  His a.m. sugars are 1 30-1 60 and postprandial can run as high as 300.  He notes his cough is better but is requesting a cough syrup.  He also needs refills on his albuterol inhaler.  He is using the human NovoLog insulin 50 units in the a.m. and at bedtime and also 30 units at lunch.  Patient also maintains metformin 500 mg twice daily  I sent refills on all these medications along with Promethazine DM to his [facility] pharmacy

## 2019-03-06 ENCOUNTER — Telehealth: Payer: Self-pay | Admitting: General Practice

## 2019-03-06 ENCOUNTER — Telehealth: Payer: Self-pay | Admitting: Critical Care Medicine

## 2019-03-06 MED ORDER — ALPRAZOLAM 2 MG PO TABS: 2.0000 mg | ORAL_TABLET | Freq: Three times a day (TID) | ORAL | 0 refills | Status: DC | PRN

## 2019-03-06 NOTE — Telephone Encounter (Signed)
Called patient and LVM to return call and schedule a televisit with Dr. Delford Field for his chronic cough.

## 2019-03-06 NOTE — Telephone Encounter (Signed)
Patient called me today, needing xanax refill and also needs appt next week to discuss chronic cough.   Was in ED recently and COVID ruled out.   Please schedule for in office visit [place] next week.  He will need pre visit screen done as he traveled to [place] recently.  If needs to be webex or telephone visit I am comfortable with that as well.  I sent xanax refill in for the patient

## 2019-03-10 ENCOUNTER — Other Ambulatory Visit: Payer: Self-pay | Admitting: Critical Care Medicine

## 2019-03-10 ENCOUNTER — Telehealth: Payer: Self-pay | Admitting: *Deleted

## 2019-03-10 DIAGNOSIS — E1140 Type 2 diabetes mellitus with diabetic neuropathy, unspecified: Secondary | ICD-10-CM

## 2019-03-10 DIAGNOSIS — IMO0002 Reserved for concepts with insufficient information to code with codable children: Secondary | ICD-10-CM

## 2019-03-10 MED ORDER — GLUCOSE BLOOD VI STRP: ORAL_STRIP | 12 refills | Status: DC

## 2019-03-10 NOTE — Telephone Encounter (Signed)
Staff called [facility] due to provider wanting staff to find out why patient is needing what's going on with patient blood sugar test strips. Provider stated that patient informed him that when ever he calls his pharmacy for refills, they tell him that he needs Prior Auth. Provider also wants staff to find out what else does the pharmacy needs from office.  Staff spoke with [place] and she stated it's not a Prior Auth that is needed. Per Edwina Barth, it is a Medical Necessity form that is needed to be filled out. Per Edwina Barth, she will fax office the forms and staff provided office fax number and [place] verbalized understanding. Information was given to provider and he verbalized understanding.

## 2019-03-11 ENCOUNTER — Other Ambulatory Visit: Payer: Self-pay

## 2019-03-11 DIAGNOSIS — IMO0002 Reserved for concepts with insufficient information to code with codable children: Secondary | ICD-10-CM

## 2019-03-11 DIAGNOSIS — E1140 Type 2 diabetes mellitus with diabetic neuropathy, unspecified: Secondary | ICD-10-CM

## 2019-03-11 MED ORDER — GLUCOSE BLOOD VI STRP: ORAL_STRIP | 12 refills | Status: DC

## 2019-03-12 ENCOUNTER — Other Ambulatory Visit: Payer: Self-pay | Admitting: Pharmacist

## 2019-03-12 MED ORDER — ONETOUCH VERIO W/DEVICE KIT: PACK | 0 refills | Status: AC

## 2019-03-12 MED ORDER — GLUCOSE BLOOD VI STRP: ORAL_STRIP | 11 refills | Status: AC

## 2019-03-12 MED ORDER — ONETOUCH DELICA PLUS LANCET33G MISC: 1.0000 | Freq: Three times a day (TID) | 11 refills | Status: AC

## 2019-03-27 ENCOUNTER — Telehealth: Payer: Self-pay

## 2019-03-27 ENCOUNTER — Other Ambulatory Visit: Payer: Self-pay

## 2019-03-27 ENCOUNTER — Encounter: Payer: Self-pay | Admitting: Critical Care Medicine

## 2019-03-27 ENCOUNTER — Ambulatory Visit: Payer: Medicare Other | Attending: Critical Care Medicine | Admitting: Critical Care Medicine

## 2019-03-27 VITALS — BP 166/84 | HR 86 | Temp 98.6°F | Ht 67.0 in | Wt 255.8 lb

## 2019-03-27 DIAGNOSIS — Z794 Long term (current) use of insulin: Secondary | ICD-10-CM | POA: Diagnosis not present

## 2019-03-27 DIAGNOSIS — Z5900 Homelessness unspecified: Secondary | ICD-10-CM

## 2019-03-27 DIAGNOSIS — I10 Essential (primary) hypertension: Secondary | ICD-10-CM

## 2019-03-27 DIAGNOSIS — E1140 Type 2 diabetes mellitus with diabetic neuropathy, unspecified: Secondary | ICD-10-CM

## 2019-03-27 DIAGNOSIS — E1142 Type 2 diabetes mellitus with diabetic polyneuropathy: Secondary | ICD-10-CM

## 2019-03-27 DIAGNOSIS — E1165 Type 2 diabetes mellitus with hyperglycemia: Secondary | ICD-10-CM | POA: Diagnosis not present

## 2019-03-27 DIAGNOSIS — G8929 Other chronic pain: Secondary | ICD-10-CM

## 2019-03-27 DIAGNOSIS — M545 Low back pain, unspecified: Secondary | ICD-10-CM

## 2019-03-27 DIAGNOSIS — IMO0002 Reserved for concepts with insufficient information to code with codable children: Secondary | ICD-10-CM

## 2019-03-27 DIAGNOSIS — F419 Anxiety disorder, unspecified: Secondary | ICD-10-CM

## 2019-03-27 DIAGNOSIS — F32A Depression, unspecified: Secondary | ICD-10-CM

## 2019-03-27 DIAGNOSIS — G894 Chronic pain syndrome: Secondary | ICD-10-CM

## 2019-03-27 DIAGNOSIS — F329 Major depressive disorder, single episode, unspecified: Secondary | ICD-10-CM

## 2019-03-27 LAB — GLUCOSE, POCT (MANUAL RESULT ENTRY): POC Glucose: 193 mg/dl — AB (ref 70–99)

## 2019-03-27 MED ORDER — INSULIN LISPRO (1 UNIT DIAL) 100 UNIT/ML (KWIKPEN): PEN_INJECTOR | SUBCUTANEOUS | 11 refills | Status: DC

## 2019-03-27 MED ORDER — PEN NEEDLES 31G X 5 MM MISC: 1.0000 [IU] | Freq: Three times a day (TID) | 3 refills | Status: DC

## 2019-03-27 MED ORDER — ALPRAZOLAM 2 MG PO TABS: 2.0000 mg | ORAL_TABLET | Freq: Three times a day (TID) | ORAL | 0 refills | Status: DC | PRN

## 2019-03-27 MED FILL — TRUEPLUS PEN NDL 31GX3/16: 31G X 5 MM | 30 days supply | Qty: 100 | Fill #0

## 2019-03-27 MED FILL — TRUEPLUS PEN NDL 31GX3/16": 31G X 5 MM | 30 days supply | Qty: 100 | Fill #0

## 2019-03-27 MED FILL — HUMALOG 100 UNITS/ML KWIKPE: 100 | 22 days supply | Qty: 30 | Fill #0

## 2019-03-27 NOTE — Telephone Encounter (Signed)
Met with the patient when he was in the clinic today.  He explained that he is living in a boarding house but he is not pleased with where the house is located and the other residents in the home.  He said that the area is not safe, and individuals are using alcohol and drugs all around him. He said that there is no air conditioning and he is not able to safely store his insulin. He would like to find another place to live. Informed him that it is very difficult to find housing during this pandemic. There is not  money readily available for motels rooms as he seems to believe.  That money, if available is limited and only available for certain programs. He can check with the [facility] if he wishes to inquire about a motel stay. There is also no guarantee that he would have housing after a motel stay if he was accepted.   Explained to  him where the [facility] is located. He also noted that he still has the information about housing from [employer] that this CM provided to him in 10/2018.He also said that he is not eligible to return to the [facility].   Informed him that this CM would check with [facility] about possible housing options.   He was agreeable to a referral to Dr Darleene Cleaver for behavioral health follow up

## 2019-03-27 NOTE — Progress Notes (Signed)
Patient is having swelling and pain in legs and ankles.

## 2019-03-27 NOTE — Progress Notes (Signed)
Subjective:    Patient ID: Antonio Sparks, male    DOB: 10/16/1951, 68 y.o.   MRN: [id number]  This is a 68 year old male who previously lived in a homeless shelter now living in a boardinghouse.  Patient has COPD, severe diabetes, coronary disease, hypertension, chronic pain syndrome, severe anxiety and depression and bipolar.    We have been attempting to assist with his multiple medical and psychiatric needs through the homeless shelter clinic supplemented by occasional visits to the health and wellness clinic.  The patient states since moving to the boardinghouse his anxiety has become quite severe.  It is even worse than when he was in the shelter.  There are numerous people with various behaviors that keep it very noisy and difficult for him to live in a boardinghouse.  Furthermore since it has become warmer his breathing is gotten worse because he is not living in an air conditioned apartment.  He is requesting assistance in getting out of this environment.  Today the patient wants his Xanax refilled and is requesting I evaluate his left lower extremity which is experiencing pain and swelling he says is erythematous.  Below are previous visits   10/18/2018 at the last visit the patient presented with anginal symptoms, and due to lack of pre-existing database we sent the patient to the emergency room the same day.  Evaluation there showed no evidence of acute coronary syndrome with negative troponins.  The patient did receive refills on his diabetic and hypertensive medications.  The patient presents today for post emergency room follow-up.  The patient now notes a productive cough of thick yellow mucus.  The patient is still actively smoking.  Patient states he is attempting to get back into the cardiology clinic at [facility]  He continues to live in a homeless shelter.   11/11/18 The patient is seen today in the clinic and was last seen at the homeless shelter clinic on  11/06/2018.  At that time we thought the patient had coverage to receive his prescriptions at the local [facility].  However it turns out the patient's Medicare prescription coverage is limited and has provided for the patient very significantly high co-pay dollar amount.  He was not able to obtain the antibiotics or cough medicine for the last week.  He was able to obtain the nitroglycerin tablets.  He ended up going to the emergency room on 11/08/2018 for chest pain and back pain.  He was not given any narcotics his evaluation was unremarkable for any acute process.  His troponins were 0 and his EKG was unremarkable.  He did have a chest x-ray which showed no active process.  Today he is persistent and requesting for opiates.  He does relate that the Lyrica we sent to his local pharmacy last week was not picked up as he did not have the ability to afford the co-pay.  He has been on gabapentin in the past with some relief.  He is requesting orthopedics and pain management referrals.   11/27/2018  The patient was seen today in the [place] homeless shelter clinic.  Last week we sent prescriptions in for benzonatate and insulin as he had lost his insulin prescription he has not could obtain these medicines from the pharmacy   The patient also was receiving treatment for tracheobronchitis and this is slowly improved.  He has minimal cough now at this time.  There are been no other changes since the last visit.  This was a check-in and  he has a follow-up appointment with me in my clinic on February 3.  01/07/2019 The patient is seen in follow-up.  He now is obtaining a new Medicare advantage plan.  History of type 2 diabetes, coronary disease, hypertension, bipolar, chronic back pain.  Patient's level of dyspnea is about the same.  He is no longer smoking.  He is not been compliant with the insulin as he has run out of his insulin the past 4 days.  He is needing refills on his insulin testing strips and insulin  and Xanax.  He is in the process of moving out of the homeless shelter at this time.  The patient has been seen by orthopedics and had injections done of the lower spine.  He has an outstanding pain medicine clinic referral that is pending.   Note as stated above the patient did see orthopedics and had injections to the lower back but this did not go well and he has not returned to that clinic for further pain management.  He is requesting a pain clinic referral and he currently now has adequate insurance.  The other barrier is that his insulin is not working properly due to the heat in the apartment he is not been able to refrigerate the insulin.  His blood sugars have been 500+ at times although today they are 193  He actually is borrowed a friend's insulin and took 40 and 50 units of insulin yesterday and his sugars have been in the 180 range since that time.  He has stated he quit smoking.    Past Medical History:  Diagnosis Date  . Bipolar disorder ([place])   . Myocardial infarct [facility])      History reviewed. No pertinent family history.   Social History   Socioeconomic History  . Marital status: Divorced    Spouse name: Not on file  . Number of children: Not on file  . Years of education: Not on file  . Highest education level: Not on file  Occupational History  . Not on file  Social Needs  . Financial resource strain: Not on file  . Food insecurity:    Worry: Not on file    Inability: Not on file  . Transportation needs:    Medical: Not on file    Non-medical: Not on file  Tobacco Use  . Smoking status: Current Every Day Smoker    Packs/day: 0.25    Types: Cigarettes  . Smokeless tobacco: Never Used  Substance and Sexual Activity  . Alcohol use: Not Currently  . Drug use: Not Currently  . Sexual activity: Not Currently  Lifestyle  . Physical activity:    Days per week: Not on file    Minutes per session: Not on file  . Stress: Not on file  Relationships  .  Social connections:    Talks on phone: Not on file    Gets together: Not on file    Attends religious service: Not on file    Active member of club or organization: Not on file    Attends meetings of clubs or organizations: Not on file    Relationship status: Not on file  . Intimate partner violence:    Fear of current or ex partner: Not on file    Emotionally abused: Not on file    Physically abused: Not on file    Forced sexual activity: Not on file  Other Topics Concern  . Not on file  Social History Narrative  .  Not on file     Allergies  Allergen Reactions  . Bee Venom Anaphylaxis  . Codeine Nausea And Vomiting and Rash  . Iodine Nausea And Vomiting     Outpatient Medications Prior to Visit  Medication Sig Dispense Refill  . albuterol (VENTOLIN HFA) 108 (90 Base) MCG/ACT inhaler Inhale 1 puff into the lungs every 6 (six) hours as needed for wheezing or shortness of breath. 1 Inhaler 1  . baclofen (LIORESAL) 10 MG tablet Take 1 tablet (10 mg total) by mouth 3 (three) times daily as needed for muscle spasms. 90 each 1  . Blood Glucose Monitoring Suppl (ONETOUCH VERIO) w/Device KIT Use as directed three times daily to check blood sugar DX Code E11.65 1 kit 0  . clopidogrel (PLAVIX) 75 MG tablet Take 1 tablet (75 mg total) by mouth daily. 30 tablet 6  . fluticasone (FLONASE) 50 MCG/ACT nasal spray Place 2 sprays into both nostrils daily. 16 g 6  . furosemide (LASIX) 40 MG tablet Take 1 tablet (40 mg total) by mouth 2 (two) times daily. 60 tablet 6  . gabapentin (NEURONTIN) 400 MG capsule Take 1 capsule (400 mg total) by mouth 3 (three) times daily. 90 capsule 1  . glucose blood (ONETOUCH VERIO) test strip Use as directed three times daily to check blood sugar DX Code E11.65 100 each 11  . Lancets (ONETOUCH DELICA PLUS ENIDPO24M) MISC 1 kit by Does not apply route 3 (three) times daily. Use as directed three times daily to check blood sugar DX Code E11.65 100 each 11  . losartan  (COZAAR) 50 MG tablet Take 1 tablet (50 mg total) by mouth daily. 30 tablet 11  . metFORMIN (GLUCOPHAGE) 500 MG tablet Take 1 tablet (500 mg total) by mouth 2 (two) times daily with a meal. 60 tablet 0  . nitroGLYCERIN (NITROSTAT) 0.4 MG SL tablet Place 1 tablet (0.4 mg total) under the tongue every 5 (five) minutes as needed for chest pain. 50 tablet 3  . pregabalin (LYRICA) 75 MG capsule     . traZODone (DESYREL) 100 MG tablet Take 1 tablet (100 mg total) by mouth at bedtime. 30 tablet 6  . ULTICARE INSULIN SYRINGE 30G X 1/2" 0.5 ML MISC Inject 1 Syringe into the skin 2 (two) times daily as needed.    . insulin lispro (HUMALOG) 100 UNIT/ML KwikPen Inject 50 units into the skin before breakfast and 50 units at bedtime, 30 units at lunch 15 mL 11  . Insulin Pen Needle (PEN NEEDLES) 31G X 5 MM MISC 1 Units by Does not apply route 3 (three) times daily before meals. Use with Humalog pens 100 each 3  . promethazine-dextromethorphan (PROMETHAZINE-DM) 6.25-15 MG/5ML syrup Take 5 mLs by mouth 4 (four) times daily as needed for cough. 118 mL 0  . alprazolam (XANAX) 2 MG tablet Take 1 tablet (2 mg total) by mouth 3 (three) times daily as needed for anxiety. 60 tablet 0   No facility-administered medications prior to visit.      Review of Systems  Constitutional: Positive for activity change. Negative for fever.  HENT: Negative.   Eyes: Negative.   Respiratory: Negative.   Cardiovascular: Positive for leg swelling. Negative for chest pain.  Endocrine: Negative.   Genitourinary: Negative.   Musculoskeletal: Positive for back pain, gait problem and myalgias.       Left hip pain   Psychiatric/Behavioral: The patient is nervous/anxious.        Objective:   Physical Exam  BP (!) 166/84   Pulse 86   Temp 98.6 F (37 C) (Oral)   Ht 5' 7"  (1.702 m)   Wt 255 lb 12.8 oz (116 kg)   SpO2 98%   BMI 40.06 kg/m   Gen: obese, anxious affect  Lungs: No use of accessory muscles, no dullness to  percussion, distant BS, much improved  Cardiovascular: RRR, heart sounds normal, no murmur or gallops, 1+ peripheral edema  Abdomen: soft and NT, no HSM,  BS normal  Musculoskeletal: No deformities, no cyanosis or clubbing Tender over Left gluteal region,  No deformity, full ROM of L hip.  No bruising Both lower extremities have mild edema and there is varicosities in the veins however there is no specific area of tenderness and no cellulitis seen in the lower extremities   Neuro: alert, non focal  Skin: Warm, no lesions or rashes      Office Visit from 03/27/2019 in [facility]  PHQ-9 Total Score  10       BMP Latest Ref Rng & Units 11/08/2018 10/11/2018 10/27/2008  Glucose 70 - 99 mg/dL 284(H) 357(H) 177(H)  BUN 8 - 23 mg/dL 8 14 20   Creatinine 0.61 - 1.24 mg/dL 0.92 1.10 1.0  Sodium 135 - 145 mmol/L 137 137 140  Potassium 3.5 - 5.1 mmol/L 3.8 4.1 4.0  Chloride 98 - 111 mmol/L 102 100 108  CO2 22 - 32 mmol/L 24 23 -  Calcium 8.9 - 10.3 mg/dL 8.5(L) 8.5(L) -   CBC Latest Ref Rng & Units 11/08/2018 10/11/2018 10/27/2008  WBC 4.0 - 10.5 K/uL 9.3 13.2(H) -  Hemoglobin 13.0 - 17.0 g/dL 14.3 14.0 16.7  Hematocrit 39.0 - 52.0 % 44.6 43.8 49.0  Platelets 150 - 400 K/uL 243 199 -   Assessment & Plan:  I personally reviewed all images and lab data in the [facility] system as well as any outside material available during this office visit and agree with the  radiology impressions.   HTN (hypertension) Hypertension under fair control given the patient's current stress levels  No changes in medications made  DM2 (diabetes mellitus, type 2) ([place]) Uncontrolled type 2 diabetes secondary to stress and medication nonadherence due to medication access issues  We have renewed and refilled today out of our pharmacy the patient's Humalog at 50 units twice daily before breakfast and at bedtime and 30 units at lunch  Uncontrolled type 2 diabetes mellitus with  diabetic neuropathy ([place]) Most of the pain in the lower extremities I think is due to the neuropathy he currently is using gabapentin but now has been able to access the Lyrica through his new insurance plan therefore he will switch to Lyrica 75 mg twice daily  Anxiety and depression Severe anxiety depression and bipolar  With the assistance of case management will make a referral to psychiatry not the patient has insurance  Xanax was refilled  Chronic bilateral low back pain without sciatica With chronic back pain and now the patient has insurance will make another referral to pain management   The patient understands that I cannot prescribe narcotics  I did prescribe Zanaflex and discontinued Robaxin  Homelessness Case management intervention on the patient's issues of homelessness   Also the case manager will assist this patient with potential other options for housing

## 2019-03-27 NOTE — Patient Instructions (Addendum)
Your Xanax is ready at the [facility]  Pick up your insulin here  He did not need anything for your lower extremities try to keep them elevated when you are not walking  Stop the gabapentin when you pick up the Lyrica and begin the Lyrica  No other medication changes are made  A psychiatry referral will be made

## 2019-03-28 ENCOUNTER — Telehealth: Payer: Self-pay | Admitting: Critical Care Medicine

## 2019-03-28 ENCOUNTER — Telehealth: Payer: Self-pay

## 2019-03-28 ENCOUNTER — Encounter: Payer: Self-pay | Admitting: Critical Care Medicine

## 2019-03-28 DIAGNOSIS — Z5900 Homelessness unspecified: Secondary | ICD-10-CM | POA: Insufficient documentation

## 2019-03-28 MED ORDER — TIZANIDINE HCL 4 MG PO CAPS: 4.0000 mg | ORAL_CAPSULE | Freq: Three times a day (TID) | ORAL | 1 refills | Status: DC

## 2019-03-28 NOTE — Assessment & Plan Note (Addendum)
Severe anxiety depression and bipolar  With the assistance of case management will make a referral to psychiatry not the patient has insurance  Xanax was refilled

## 2019-03-28 NOTE — Telephone Encounter (Signed)
Behavioral health referral faxed to [facility].  Spoke to [facility] [employer]. Explained the patient's current living situation and desire to leave the boarding  house where he is residing. Also noted that he had been at the [facility] prior to this boarding house. Debbie stated that she would need to check with [facility] to see if he would be permitted back.    Call received from [place] stating that she spoke to Nurse, adult at [facility]. He explained that the patient and another man left [facility] on their own. He did not assist them with finding housing. He said that it would be best for him to stay where he is. Tammy Sours could not authorize the patient's return to [place]. Tammy Sours also noted that that patient continued to use drugs on occasion.     Call placed to patient # [phone] to inform him that [facility] did not have any options for him at this time and suggested that he stay where he is. He can contact the [facility] # [phone] for other possible alternatives. Message left requesting a call back to this CM.

## 2019-03-28 NOTE — Assessment & Plan Note (Addendum)
With chronic back pain and now the patient has insurance will make another referral to pain management   The patient understands that I cannot prescribe narcotics  I did prescribe Zanaflex and discontinued Robaxin

## 2019-03-28 NOTE — Assessment & Plan Note (Signed)
Uncontrolled type 2 diabetes secondary to stress and medication nonadherence due to medication access issues  We have renewed and refilled today out of our pharmacy the patient's Humalog at 50 units twice daily before breakfast and at bedtime and 30 units at lunch

## 2019-03-28 NOTE — Telephone Encounter (Signed)
Antonio Sparks with [facility] called to notify that patient is already a patient of [facility] and they do not do medication management only.. Please follow up.

## 2019-03-28 NOTE — Assessment & Plan Note (Signed)
Most of the pain in the lower extremities I think is due to the neuropathy he currently is using gabapentin but now has been able to access the Lyrica through his new insurance plan therefore he will switch to Lyrica 75 mg twice daily

## 2019-03-28 NOTE — Assessment & Plan Note (Signed)
Hypertension under fair control given the patient's current stress levels  No changes in medications made

## 2019-03-28 NOTE — Assessment & Plan Note (Signed)
Case management intervention on the patient's issues of homelessness

## 2019-04-07 ENCOUNTER — Emergency Department: Admit: 2019-04-07 | Payer: MEDICARE | Primary: Family Medicine

## 2019-04-07 ENCOUNTER — Inpatient Hospital Stay
Admit: 2019-04-07 | Discharge: 2019-04-18 | Disposition: A | Discharge status: Home / Self Care | Payer: MEDICARE | Attending: Internal Medicine | Admitting: Internal Medicine

## 2019-04-07 DIAGNOSIS — F419 Anxiety disorder, unspecified: Principal | ICD-10-CM

## 2019-04-07 LAB — COMPREHENSIVE METABOLIC PANEL
ALT: 14 U/L (ref 12–78)
AST: 6 U/L — ABNORMAL LOW (ref 15–37)
Albumin/Globulin Ratio: 0.8 — ABNORMAL LOW (ref 1.1–2.2)
Albumin: 2.9 g/dL — ABNORMAL LOW (ref 3.5–5.0)
Alkaline Phosphatase: 74 U/L (ref 45–117)
Anion Gap: 7 mmol/L (ref 5–15)
BUN: 15 MG/DL (ref 6–20)
Bun/Cre Ratio: 14 (ref 12–20)
CO2: 25 mmol/L (ref 21–32)
Calcium: 8.6 MG/DL (ref 8.5–10.1)
Chloride: 105 mmol/L (ref 97–108)
Creatinine: 1.09 MG/DL (ref 0.70–1.30)
EGFR IF NonAfrican American: >60 mL/min/{1.73_m2} (ref >60)
GFR African American: >60 mL/min/{1.73_m2} (ref >60)
Globulin: 3.8 g/dL (ref 2.0–4.0)
Glucose: 225 mg/dL — ABNORMAL HIGH (ref 65–100)
Potassium: 3.8 mmol/L (ref 3.5–5.1)
Sodium: 137 mmol/L (ref 136–145)
Total Bilirubin: 0.5 MG/DL (ref 0.2–1.0)
Total Protein: 6.7 g/dL (ref 6.4–8.2)

## 2019-04-07 LAB — CBC WITH AUTO DIFFERENTIAL
Basophils %: 0 % (ref 0–1)
Basophils Absolute: 0.1 10*3/uL (ref 0.0–0.1)
Eosinophils %: 2 % (ref 0–7)
Eosinophils Absolute: 0.2 10*3/uL (ref 0.0–0.4)
Granulocyte Absolute Count: 0.1 10*3/uL — ABNORMAL HIGH (ref 0.00–0.04)
Hematocrit: 44.6 % (ref 36.6–50.3)
Hemoglobin: 14.6 g/dL (ref 12.1–17.0)
Immature Granulocytes: 1 % — ABNORMAL HIGH (ref 0.0–0.5)
Lymphocytes %: 14 % (ref 12–49)
Lymphocytes Absolute: 1.6 10*3/uL (ref 0.8–3.5)
MCH: 29 PG (ref 26.0–34.0)
MCHC: 32.7 g/dL (ref 30.0–36.5)
MCV: 88.7 FL (ref 80.0–99.0)
MPV: 11.3 FL (ref 8.9–12.9)
Monocytes %: 6 % (ref 5–13)
Monocytes Absolute: 0.7 10*3/uL (ref 0.0–1.0)
NRBC Absolute: 0 10*3/uL (ref 0.00–0.01)
Neutrophils %: 77 % — ABNORMAL HIGH (ref 32–75)
Neutrophils Absolute: 8.8 10*3/uL — ABNORMAL HIGH (ref 1.8–8.0)
Nucleated RBCs: 0 PER 100 WBC
Platelets: 228 10*3/uL (ref 150–400)
RBC: 5.03 M/uL (ref 4.10–5.70)
RDW: 13.9 % (ref 11.5–14.5)
WBC: 11.5 10*3/uL — ABNORMAL HIGH (ref 4.1–11.1)

## 2019-04-07 LAB — TROPONIN: Troponin I: <0.05 ng/mL (ref <0.05)

## 2019-04-07 LAB — PROTIME-INR
INR: 0.9 (ref 0.9–1.1)
INR: 1 (ref 0.9–1.1)
Protime: 9.8 s (ref 9.0–11.1)
Protime: 10.3 s (ref 9.0–11.1)

## 2019-04-07 LAB — PROBNP, N-TERMINAL: BNP: 99 PG/ML (ref <125)

## 2019-04-07 LAB — MAGNESIUM
Magnesium: 1.7 mg/dL (ref 1.6–2.4)
Magnesium: 1.7 mg/dL (ref 1.6–2.4)

## 2019-04-07 LAB — ETHYL ALCOHOL
ALCOHOL(ETHYL),SERUM: <10 MG/DL (ref <10)
Ethyl Alcohol: <10 MG/DL (ref <10)

## 2019-04-07 LAB — FERRITIN
Ferritin: 131 NG/ML (ref 26–388)
Ferritin: 131 NG/ML (ref 26–388)

## 2019-04-07 LAB — POCT GLUCOSE: POC Glucose: 156 mg/dL — ABNORMAL HIGH (ref 65–100)

## 2019-04-07 LAB — HEMOGLOBIN A1C W/EAG
Hemoglobin A1C: 9.1 % — ABNORMAL HIGH (ref 4.0–5.6)
eAG: 214 mg/dL

## 2019-04-07 LAB — SALICYLATE
Salicylate level: 3.5 MG/DL (ref 2.8–20.0)
Salicylate: 3.5 MG/DL (ref 2.8–20.0)

## 2019-04-07 LAB — FIBRINOGEN
Fibrinogen: 553 mg/dL — ABNORMAL HIGH (ref 200–475)
Fibrinogen: 553 mg/dL — ABNORMAL HIGH (ref 200–475)

## 2019-04-07 LAB — D-DIMER, QUANTITATIVE
D-Dimer, Quant: 0.39 mg/L FEU (ref 0.00–0.65)
D-Dimer, Quant: 1.54 mg/L FEU — ABNORMAL HIGH (ref 0.00–0.65)

## 2019-04-07 LAB — APTT: aPTT: 26.8 s (ref 22.1–32.0)

## 2019-04-07 LAB — ACETAMINOPHEN LEVEL: Acetaminophen Level: <2 ug/mL — ABNORMAL LOW (ref 10–30)

## 2019-04-07 LAB — LACTATE DEHYDROGENASE: LD: 176 U/L (ref 85–241)

## 2019-04-07 LAB — LIPASE
Lipase: 124 U/L (ref 73–393)
Lipase: 124 U/L (ref 73–393)

## 2019-04-07 LAB — METABOLIC PANEL, COMPREHENSIVE
A-G Ratio: 0.8 — ABNORMAL LOW (ref 1.1–2.2)
ALT (SGPT): 14 U/L (ref 12–78)
AST (SGOT): 6 U/L — ABNORMAL LOW (ref 15–37)
Albumin: 2.9 g/dL — ABNORMAL LOW (ref 3.5–5.0)
Alk. phosphatase: 74 U/L (ref 45–117)
Anion gap: 7 mmol/L (ref 5–15)
BUN/Creatinine ratio: 14 (ref 12–20)
BUN: 15 MG/DL (ref 6–20)
Bilirubin, total: 0.5 MG/DL (ref 0.2–1.0)
CO2: 25 mmol/L (ref 21–32)
Calcium: 8.6 MG/DL (ref 8.5–10.1)
Chloride: 105 mmol/L (ref 97–108)
Creatinine: 1.09 MG/DL (ref 0.70–1.30)
GFR est AA: >60 mL/min/{1.73_m2} (ref >60)
GFR est non-AA: >60 mL/min/{1.73_m2} (ref >60)
Globulin: 3.8 g/dL (ref 2.0–4.0)
Glucose: 225 mg/dL — ABNORMAL HIGH (ref 65–100)
Potassium: 3.8 mmol/L (ref 3.5–5.1)
Protein, total: 6.7 g/dL (ref 6.4–8.2)
Sodium: 137 mmol/L (ref 136–145)

## 2019-04-07 LAB — D DIMER
D-dimer: 0.39 mg/L FEU (ref 0.00–0.65)
D-dimer: 1.54 mg/L FEU — ABNORMAL HIGH (ref 0.00–0.65)

## 2019-04-07 LAB — CBC WITH AUTOMATED DIFF
ABS. BASOPHILS: 0.1 10*3/uL (ref 0.0–0.1)
ABS. EOSINOPHILS: 0.2 10*3/uL (ref 0.0–0.4)
ABS. IMM. GRANS.: 0.1 10*3/uL — ABNORMAL HIGH (ref 0.00–0.04)
ABS. LYMPHOCYTES: 1.6 10*3/uL (ref 0.8–3.5)
ABS. MONOCYTES: 0.7 10*3/uL (ref 0.0–1.0)
ABS. NEUTROPHILS: 8.8 10*3/uL — ABNORMAL HIGH (ref 1.8–8.0)
ABSOLUTE NRBC: 0 10*3/uL (ref 0.00–0.01)
BASOPHILS: 0 % (ref 0–1)
EOSINOPHILS: 2 % (ref 0–7)
HCT: 44.6 % (ref 36.6–50.3)
HGB: 14.6 g/dL (ref 12.1–17.0)
IMMATURE GRANULOCYTES: 1 % — ABNORMAL HIGH (ref 0.0–0.5)
LYMPHOCYTES: 14 % (ref 12–49)
MCH: 29 PG (ref 26.0–34.0)
MCHC: 32.7 g/dL (ref 30.0–36.5)
MCV: 88.7 FL (ref 80.0–99.0)
MONOCYTES: 6 % (ref 5–13)
MPV: 11.3 FL (ref 8.9–12.9)
NEUTROPHILS: 77 % — ABNORMAL HIGH (ref 32–75)
NRBC: 0 PER 100 WBC
PLATELET: 228 10*3/uL (ref 150–400)
RBC: 5.03 M/uL (ref 4.10–5.70)
RDW: 13.9 % (ref 11.5–14.5)
WBC: 11.5 10*3/uL — ABNORMAL HIGH (ref 4.1–11.1)

## 2019-04-07 LAB — GLUCOSE, POC: Glucose (POC): 156 mg/dL — ABNORMAL HIGH (ref 65–100)

## 2019-04-07 LAB — PROTHROMBIN TIME + INR
INR: 0.9 (ref 0.9–1.1)
INR: 1 (ref 0.9–1.1)
Prothrombin time: 9.8 s (ref 9.0–11.1)
Prothrombin time: 10.3 s (ref 9.0–11.1)

## 2019-04-07 LAB — HEMOGLOBIN A1C WITH EAG
Est. average glucose: 214 mg/dL
Hemoglobin A1c: 9.1 % — ABNORMAL HIGH (ref 4.0–5.6)

## 2019-04-07 LAB — TROPONIN I: Troponin-I, Qt.: <0.05 ng/mL (ref <0.05)

## 2019-04-07 LAB — ACETAMINOPHEN: Acetaminophen level: <2 ug/mL — ABNORMAL LOW (ref 10–30)

## 2019-04-07 LAB — SAMPLES BEING HELD

## 2019-04-07 LAB — LD: LD: 176 U/L (ref 85–241)

## 2019-04-07 LAB — PTT: aPTT: 26.8 s (ref 22.1–32.0)

## 2019-04-07 LAB — NT-PRO BNP: NT pro-BNP: 99 PG/ML (ref <125)

## 2019-04-07 MED ORDER — NICOTINE 14 MG/24 HR DAILY PATCH
14 mg/24 hr | Freq: Every day | TRANSDERMAL | Status: DC
Start: 2019-04-07 — End: 2019-04-18

## 2019-04-07 MED ORDER — ACETAMINOPHEN 325 MG TABLET
325 mg | ORAL | Status: DC | PRN
Start: 2019-04-07 — End: 2019-04-18
  Administered 2019-04-15 – 2019-04-18 (×3): via ORAL

## 2019-04-07 MED ORDER — ONDANSETRON (PF) 4 MG/2 ML INJECTION
4 mg/2 mL | INTRAMUSCULAR | Status: DC | PRN
Start: 2019-04-07 — End: 2019-04-18
  Administered 2019-04-18: 13:00:00 via INTRAVENOUS

## 2019-04-07 MED ORDER — LORAZEPAM 2 MG/ML IJ SOLN
2 mg/mL | INTRAMUSCULAR | Status: DC | PRN
Start: 2019-04-07 — End: 2019-04-08
  Administered 2019-04-07 – 2019-04-08 (×6): via INTRAVENOUS

## 2019-04-07 MED ORDER — SODIUM CHLORIDE 0.9 % IJ SYRG
Freq: Three times a day (TID) | INTRAMUSCULAR | Status: DC
Start: 2019-04-07 — End: 2019-04-18
  Administered 2019-04-07 – 2019-04-18 (×33): via INTRAVENOUS

## 2019-04-07 MED ORDER — MORPHINE 2 MG/ML INJECTION
2 mg/mL | INTRAMUSCULAR | Status: DC | PRN
Start: 2019-04-07 — End: 2019-04-08
  Administered 2019-04-07 – 2019-04-08 (×5): via INTRAVENOUS

## 2019-04-07 MED ORDER — ASPIRIN 81 MG CHEWABLE TAB
81 mg | Freq: Every day | ORAL | Status: DC
Start: 2019-04-07 — End: 2019-04-07

## 2019-04-07 MED ORDER — SODIUM CHLORIDE 0.9 % IJ SYRG
INTRAMUSCULAR | Status: DC | PRN
Start: 2019-04-07 — End: 2019-04-18

## 2019-04-07 MED ORDER — GLUCAGON 1 MG INJECTION
1 mg | INTRAMUSCULAR | Status: DC | PRN
Start: 2019-04-07 — End: 2019-04-18

## 2019-04-07 MED ORDER — ASPIRIN 81 MG CHEWABLE TAB
81 mg | Freq: Every day | ORAL | Status: DC
Start: 2019-04-07 — End: 2019-04-18
  Administered 2019-04-07 – 2019-04-18 (×12): via ORAL

## 2019-04-07 MED ORDER — GLUCOSE 4 GRAM CHEWABLE TAB
4 gram | ORAL | Status: DC | PRN
Start: 2019-04-07 — End: 2019-04-18

## 2019-04-07 MED ORDER — PROCHLORPERAZINE MALEATE 5 MG TAB
5 mg | Freq: Four times a day (QID) | ORAL | Status: DC | PRN
Start: 2019-04-07 — End: 2019-04-15

## 2019-04-07 MED ORDER — LORAZEPAM 2 MG/ML IJ SOLN
2 mg/mL | INTRAMUSCULAR | Status: DC | PRN
Start: 2019-04-07 — End: 2019-04-08

## 2019-04-07 MED ORDER — INSULIN LISPRO 100 UNIT/ML INJECTION
100 unit/mL | Freq: Four times a day (QID) | SUBCUTANEOUS | Status: DC
Start: 2019-04-07 — End: 2019-04-18
  Administered 2019-04-07 – 2019-04-18 (×39): via SUBCUTANEOUS

## 2019-04-07 MED ORDER — MVI,ADULT NO.4,VIT K 3300 UNIT-150 MCG/10 ML INTRAVENOUS SOLUTION
3300 unit- 150 mcg/10 mL | Freq: Every day | INTRAVENOUS | Status: DC
Start: 2019-04-07 — End: 2019-04-09
  Administered 2019-04-07 – 2019-04-08 (×2): via INTRAVENOUS

## 2019-04-07 MED ORDER — LORAZEPAM 2 MG/ML IJ SOLN
2 mg/mL | INTRAMUSCULAR | Status: AC
Start: 2019-04-07 — End: 2019-04-07

## 2019-04-07 MED ORDER — MORPHINE 2 MG/ML INJECTION
2 mg/mL | INTRAMUSCULAR | Status: AC
Start: 2019-04-07 — End: 2019-04-07
  Administered 2019-04-07: 16:00:00 via INTRAVENOUS

## 2019-04-07 MED ORDER — HYDRALAZINE 20 MG/ML IJ SOLN
20 mg/mL | Freq: Four times a day (QID) | INTRAMUSCULAR | Status: DC | PRN
Start: 2019-04-07 — End: 2019-04-18
  Administered 2019-04-09: via INTRAVENOUS

## 2019-04-07 MED ORDER — ATORVASTATIN 10 MG TAB
10 mg | Freq: Every evening | ORAL | Status: DC
Start: 2019-04-07 — End: 2019-04-08
  Administered 2019-04-08: 02:00:00 via ORAL

## 2019-04-07 MED ORDER — HEPARIN (PORCINE) 5,000 UNIT/ML IJ SOLN
5000 unit/mL | Freq: Three times a day (TID) | INTRAMUSCULAR | Status: DC
Start: 2019-04-07 — End: 2019-04-18
  Administered 2019-04-07 – 2019-04-18 (×33): via SUBCUTANEOUS

## 2019-04-07 MED ORDER — IPRATROPIUM-ALBUTEROL 2.5 MG-0.5 MG/3 ML NEB SOLUTION
2.5 mg-0.5 mg/3 ml | RESPIRATORY_TRACT | Status: DC | PRN
Start: 2019-04-07 — End: 2019-04-07

## 2019-04-07 MED ORDER — ONDANSETRON (PF) 4 MG/2 ML INJECTION
4 mg/2 mL | INTRAMUSCULAR | Status: AC
Start: 2019-04-07 — End: 2019-04-07
  Administered 2019-04-07: 16:00:00 via INTRAVENOUS

## 2019-04-07 MED ORDER — FAMOTIDINE 20 MG TAB
20 mg | Freq: Every day | ORAL | Status: DC
Start: 2019-04-07 — End: 2019-04-08
  Administered 2019-04-08: 12:00:00 via ORAL

## 2019-04-07 MED ORDER — ALBUTEROL SULFATE HFA 90 MCG/ACTUATION AEROSOL INHALER
90 mcg/actuation | RESPIRATORY_TRACT | Status: DC | PRN
Start: 2019-04-07 — End: 2019-04-18

## 2019-04-07 MED ORDER — GUAIFENESIN 100 MG/5 ML ORAL LIQUID
100 mg/5 mL | ORAL | Status: DC | PRN
Start: 2019-04-07 — End: 2019-04-18

## 2019-04-07 MED FILL — NORMAL SALINE FLUSH 0.9 % INJECTION SYRINGE: INTRAMUSCULAR | Qty: 20

## 2019-04-07 MED FILL — SODIUM CHLORIDE 0.9 % IV: INTRAVENOUS | Qty: 1000

## 2019-04-07 MED FILL — NORMAL SALINE FLUSH 0.9 % INJECTION SYRINGE: INTRAMUSCULAR | Qty: 10

## 2019-04-07 MED FILL — MONOJECT PREFILL ADVANCED 0.9 % SODIUM CHLORIDE INJECTION SYRINGE: INTRAMUSCULAR | Qty: 40

## 2019-04-07 MED FILL — LORAZEPAM 2 MG/ML IJ SOLN: 2 mg/mL | INTRAMUSCULAR | Qty: 1

## 2019-04-07 MED FILL — ONDANSETRON (PF) 4 MG/2 ML INJECTION: 4 mg/2 mL | INTRAMUSCULAR | Qty: 2

## 2019-04-07 MED FILL — CHILDREN'S ASPIRIN 81 MG CHEWABLE TABLET: 81 mg | ORAL | Qty: 1

## 2019-04-07 MED FILL — MORPHINE 2 MG/ML INJECTION: 2 mg/mL | INTRAMUSCULAR | Qty: 1

## 2019-04-07 MED FILL — INSULIN LISPRO 100 UNIT/ML INJECTION: 100 unit/mL | SUBCUTANEOUS | Qty: 1

## 2019-04-07 MED FILL — HEPARIN (PORCINE) 5,000 UNIT/ML IJ SOLN: 5000 unit/mL | INTRAMUSCULAR | Qty: 1

## 2019-04-07 MED FILL — MORPHINE 2 MG/ML INJECTION: 2 mg/mL | INTRAMUSCULAR | Qty: 2

## 2019-04-07 NOTE — Progress Notes (Signed)
1930: Verbal shift change report given to Milan RN (oncoming nurse) by Sarah RN (offgoing nurse). Report included the following information SBAR, Kardex, Intake/Output, MAR, Recent Results and Cardiac Rhythm NSR.     11:15 Verbal shift change report given to Janet RN (oncoming nurse) by Milan RN (offgoing nurse). Report included the following information SBAR, Kardex, MAR, Accordion, Cardiac Rhythm NSR and Alarm Parameters .

## 2019-04-07 NOTE — Other (Signed)
TRANSFER - OUT REPORT:    Verbal report given to Sarah RN (name) on Derek Mclaughlin  being transferred to Room 400 (unit) for routine progression of care       Report consisted of patient???s Situation, Background, Assessment and   Recommendations(SBAR).     Information from the following report(s) SBAR, Kardex, ED Summary, MAR, Recent Results and Cardiac Rhythm NSR was reviewed with the receiving nurse.    Lines:   Peripheral IV 04/07/19 Right Forearm (Active)   Site Assessment Clean, dry, & intact 04/07/2019 11:12 AM   Phlebitis Assessment 0 04/07/2019 11:12 AM   Infiltration Assessment 0 04/07/2019 11:12 AM   Dressing Status Clean, dry, & intact 04/07/2019 11:12 AM        Opportunity for questions and clarification was provided.      Patient transported with:   [employer]

## 2019-04-07 NOTE — Progress Notes (Addendum)
Bedside shift change report given to Mulan, RN  (oncoming nurse) by Sarah, Rn (offgoing nurse). Report included the following information SBAR, Kardex, Intake/Output, MAR, Accordion and Recent Results.     Problem: Unstable angina/NSTEMI: Day of Admission/Day 1  Goal: Activity/Safety  Outcome: Progressing Towards Goal  Goal: Diagnostic Test/Procedures  Outcome: Progressing Towards Goal      On bed alarm. Encouraged to increase time OOB. VVS.

## 2019-04-07 NOTE — ACP (Advance Care Planning) (Signed)
Advance Care Planning     Advance Care Planning Activator (Inpatient)  Conversation Note      Date of ACP Conversation: 04/07/19     Conversation Conducted with:   Patient with Decision Making Capacity    ACP Activator: Collier Flowers, MSW    Health Care Decision Maker:  Patient states that he has no one that he trusts or that he would like to use as an emergency contact. He does have a Personnel officer who lives in [place], [place]. They have not spoken in many years. He has grandchildren in [place], [place]. He said he "has their number but not on him."    Care Preferences    Ventilation:  "If you were in your present state of health and suddenly became very ill and were unable to breathe on your own, what would your preference be about the use of a ventilator (breathing machine) if it were available to you?"      If patient would desire the use of a ventilator (breathing machine), answer "yes", if not "no":yes    "If your health worsens and it becomes clear that your chance of recovery is unlikely, what would your preference be about the use of a ventilator (breathing machine) if it were available to you?"     Would the patient desire the use of a ventilator (breathing machine)?  NO      Resuscitation  "CPR works best to restart the heart when there is a sudden event, like a heart attack, in someone who is otherwise healthy. Unfortunately, CPR does not typically restart the heart for people who have serious health conditions or who are very sick."    "In the event your heart stopped as a result of an underlying serious health condition, would you want attempts to be made to restart your heart (answer "yes" for attempt to resuscitate) or would you prefer a natural death (answer "no" for do not attempt to resuscitate)?" yes    [x]  Yes  []  No   Educated Patient / Decision Maker regarding differences between Advance Directives and portable DNR orders.    Length of ACP Conversation in minutes:       Conversation Outcomes:  []  ACP discussion completed  []  Existing advance directive reviewed with patient; no changes to patient's previously recorded wishes     []  New Advance Directive completed   []  Portable Do Not Resuscitate prepared for Provider review and signature  []  POLST/POST/MOLST/MOST prepared for Provider review and signature      Follow-up plan:    [x]  Schedule follow-up conversation to continue planning  []  Referred individual to Provider for additional questions/concerns   []  Advised patient/agent/surrogate to review completed ACP document and update if needed with changes in condition, patient preferences or care setting     [x]  This note routed to one or more involved healthcare providers       Patient is currently COVID-19 rule out. Spoke with patient on the phone. Offered for pastoral care to come talk with him to complete an AMD once COVID-19 tests comes back. Explained if negative, they can speak with him in the room.     Collier Flowers, MSW

## 2019-04-07 NOTE — ED Provider Notes (Signed)
HPI     Pt is a 68 y.o. M with PMH of DM, [name], HTN, [name], COPD here with c/o chest pain since last night.  He is having N/V associated with chest pain and also having shortness of breath and cough.  He describes pain as heaviness and radiates up to neck.  He is visiting from and says he has been traveling for about 1 days.  He has been out of medications x 4 days.  He restarted drinking alcohol about 2 weeks ago.  He says he doesn't care if he lives.  He is having hallucinations also.      Past Medical History:   Diagnosis Date   ??? A-fib (HCC)    ??? Anxiety    ??? Arthritis    ??? [name] (coronary artery disease)    ??? Chronic obstructive pulmonary disease (HCC)    ??? Depression    ??? Diabetes (HCC)    ??? High cholesterol    ??? Hypertension    ??? MI (myocardial infarction) (HCC)    ??? Schizophrenia (HCC)        Past Surgical History:   Procedure Laterality Date   ??? HX CORONARY STENT PLACEMENT           History reviewed. No pertinent family history.    Social History     Socioeconomic History   ??? Marital status: Not on file     Spouse name: Not on file   ??? Number of children: Not on file   ??? Years of education: Not on file   ??? Highest education level: Not on file   Occupational History   ??? Not on file   Social Needs   ??? Financial resource strain: Not on file   ??? Food insecurity     Worry: Not on file     Inability: Not on file   ??? Transportation needs     Medical: Not on file     Non-medical: Not on file   Tobacco Use   ??? Smoking status: Current Every Day Smoker     Packs/day: 0.50   Substance and Sexual Activity   ??? Alcohol use: Yes   ??? Drug use: Yes     Types: Marijuana   ??? Sexual activity: Not on file   Lifestyle   ??? Physical activity     Days per week: Not on file     Minutes per session: Not on file   ??? Stress: Not on file   Relationships   ??? Social Wellsite geologistconnections     Talks on phone: Not on file     Gets together: Not on file     Attends religious service: Not on file     Active member of club or organization: Not on file      Attends meetings of clubs or organizations: Not on file     Relationship status: Not on file   ??? Intimate partner violence     Fear of current or ex partner: Not on file     Emotionally abused: Not on file     Physically abused: Not on file     Forced sexual activity: Not on file   Other Topics Concern   ??? Not on file   Social History Narrative   ??? Not on file         ALLERGIES: Bee venom protein (honey bee); Codeine; and Iodine    Review of Systems   Constitutional: Negative for chills, diaphoresis  and fever.   HENT: Negative for congestion and trouble swallowing.    Eyes: Negative for photophobia and visual disturbance.   Respiratory: Positive for cough and shortness of breath. Negative for chest tightness.    Cardiovascular: Positive for chest pain. Negative for palpitations and leg swelling.   Gastrointestinal: Negative for abdominal pain, diarrhea, nausea and vomiting.   Genitourinary: Negative for difficulty urinating, dysuria, flank pain and frequency.   Musculoskeletal: Negative for back pain and myalgias.   Skin: Negative for rash and wound.   Neurological: Negative for dizziness, weakness, light-headedness and headaches.   Hematological: Negative for adenopathy. Does not bruise/bleed easily.   Psychiatric/Behavioral: Positive for hallucinations. Negative for agitation and confusion.   All other systems reviewed and are negative.      Vitals:    04/07/19 1112   BP: 145/84   Pulse: 90   Resp: 19   Temp: 97.7 ??F (36.5 ??C)   SpO2: 96%   Weight: 117 kg (258 lb)   Height: 5\' 11"  (1.803 m)            Physical Exam  Vitals signs and nursing note reviewed.   Constitutional:       General: He is not in acute distress.     Appearance: He is well-developed. He is not diaphoretic.   HENT:      Head: Normocephalic.   Eyes:      Conjunctiva/sclera: Conjunctivae normal.      Pupils: Pupils are equal, round, and reactive to light.   Neck:      Musculoskeletal: Normal range of motion and neck supple.       Vascular: No JVD.   Cardiovascular:      Rate and Rhythm: Normal rate and regular rhythm.      Heart sounds: Normal heart sounds.   Pulmonary:      Effort: Pulmonary effort is normal.      Breath sounds: Normal breath sounds.   Abdominal:      General: Bowel sounds are normal. There is no distension.      Palpations: Abdomen is soft.      Tenderness: There is no abdominal tenderness.   Musculoskeletal: Normal range of motion.         General: No tenderness or deformity.   Lymphadenopathy:      Cervical: No cervical adenopathy.   Skin:     General: Skin is warm and dry.      Capillary Refill: Capillary refill takes less than 2 seconds.      Findings: No erythema or rash.   Neurological:      Mental Status: He is alert and oriented to person, place, and time.      Cranial Nerves: No cranial nerve deficit.      Sensory: No sensory deficit.          MDM       Procedures      ED EKG interpretation:  Rhythm: normal sinus rhythm; and regular . Rate (approx.): 85; P wave: normal; QRS interval: prolonged; ST/T wave: no ST elevation or depression. T wave normal.  Possible Q wave inferior.  RBBB.  EKG documented by Lisette Grinder, MD, as interpreted by Lisette Grinder, MD, ED MD.    [place] for Admission  1:40 PM to Dr. Army Melia    ED Room Number: ER10/10  Patient Name and age:  Derek Mclaughlin 68 y.o.  male  Working Diagnosis:   1. Chest pain, unspecified type  COVID-19 Suspicion:  yes    Code Status:  Full Code  Readmission: no  Isolation Requirements:  yes  Recommended Level of Care:  telemetry  Department:[facility] - [phone]) [phone]  Other:  Chest pain, cough, dyspnea.  Hx of [name], DM and [name] with risk factors for ACS.  Pain is tightening with radiation to jaw.  Pain improved but still present.  Would like admitted for ACS r/o stress etc.  Of note he is alcoholic and has hx schizophrenia.  Off meds x few days.         Vangie Bickerarla Joselyn Edling, MD

## 2019-04-07 NOTE — Progress Notes (Addendum)
Care Management:    Transition of Care Plan:     ?? RUR: not listed (observation status)  ?? Disposition: homeless shelter or alcohol rehab  ?? Transportation: bus or Lyft      14:35 Patient in ED with COVID-19 rule out. Called patient in ED -room on his cell phone [phone]) in attempt to complete CM assessment and initiate ACP conversation. Received voice mail. Left voice mail requesting a call back.     Per chart review, patient recently to [place] from [place], [place]. Per H&P, patient came to [place] to live with friends, but when he arrived at the address, no one was there. He has been staying in a hotel until his money ran out. Per review of Encounters, patient was seeing a PCP at [facility] in [place], [place]. His visits begin December 2019 and last visit was 03-27-19. Per review of those visits, patient said he came to [facility] from [place]. While in [place], he lived in a homeless shelter at times and a boarding house other times.     Collier Flowers, MSW     16:33 Addendum: Called patient in his hospital room to complete initial assessment and begin ACP conversation. Attempted to explain who I was and role. Patient said he could not hear this CM due to the noise from the negative pressure machine in the room and him being hard of hearing. Patient stated he would need to speak in person. He hung up. CM will await results of pending COVID-19 test.     Called nurse's station. Spoke with Network engineer. Patient just arrived to floor. RN is completing assessment now. Requested secretary asked RN to try to obtain an emergency contact person for patient. RN spoke with patient. Patient told her that he does not have an emergency contact. Asked that nurse continue to try to obtain a phone number for patient tonight.     Collier Flowers, MSW     16:58 Addendum: Patient called this CM back. He said that he had stayed with a friend and his wife in [place] last summer. They said he could  stay with them again. When he got his check on June 1st, he sent them $1000. When he arrived at their address, there was a for sale sign in the yard and no one was in the house. He then went to stay in a hotel. His money has now ran out. He will need a place to go at time of discharge.     Patient stated he had been sober for 9 years, until 2 weeks ago. He began drinking 2 weeks ago and does not wish to continue. Patient said that he would like to a rehab center for 28 days of rehab if possible. He said he called his insurance and they do pay for that. Per chart review, patient has AARP Medicare Complete.    Reason for Admission:   Chest pain                    RUR Score:         not listed (observation status)           Plan for utilizing home health:      none    PCP: NO PCP.                       Current Advanced Directive/Advance Care Plan: Patient states that he has no one  that he trusts or that he would like to use as an emergency contact. He does have a Personnel officer who lives in [place], [place]. They have not spoken in many years. He has grandchildren in [place], [place]. He said he "has their number but not on him." Patient is agreeable to completing AMD, but does not have anyone to place as mPOA.                         Care Management Interventions  PCP Verified by CM: Yes([facility] in [place]., [place])  Last Visit to PCP: 04/27/19  Mode of Transport at Discharge: Other (see comment)(Lyft vs bus)  Transition of Care Consult (CM Consult): Discharge Planning  Discharge Durable Medical Equipment: No  Physical Therapy Consult: No  Occupational Therapy Consult: No  Speech Therapy Consult: No  Current Support Network: Other(Homeless)  [employer] Provided?: No  Discharge Location  Discharge Placement: Shelter    Collier Flowers, MSW

## 2019-04-07 NOTE — ED Notes (Signed)
Attempted report. RN to call back

## 2019-04-07 NOTE — ED Triage Notes (Addendum)
Patient comes to the ER via EMS c/o chest pain, sob and cough for the past couple of days. Reports coming to [place] from [place] on greyhound bus and accidentally leaving medicine on  Bus. Has been without medicine x4 days    Reports he is a schizophrenic and has been having auditory hallucinations. +Tearful  Reports decreased will to live  Reports being sober for 6 years and stating drinking again 2 weeks ago along with marijuana. Reports drinking everyday for 2 weeks    Given 1 SL NTG and 1 ASA en route, reports no relief

## 2019-04-07 NOTE — H&P (Addendum)
History & Physical    Primary Care Provider: Other, Phys, MD  Source of Information: Patient patient.    History of Presenting Illness:   Derek Mclaughlin is a 68 y.o. male who presents with PMH of DM, [name], HTN, [name], COPD here with c/o chest pain x1 week and worsening since last night with associated N/V and also having shortness of breath and cough. He describes pain as tight heaviness to left side of chest and radiates up to neck and jaw.  He is visiting from [place] and states he was supposed to living with friends when he got here but the house was vacant when he arrived. Has lived in [place] until he ran out of money now homeless. He has been out of medications for about 1 week prior to coming to ED. He states he has a history of alcohol dependence and restarted drinking alcohol about 2 weeks prior to coming to ED and drinks 12 beers/day. He states he has a hx of bipolar and schizophrenia and has had an increase in symptoms of depression over last few months, he stated he doesn't care if he lives but denies suicidal and homicidal ideations.He states he is having auditory and visual hallucinations x2 weeks where he hears his deceased wifes voice and thinks he has seen her in a room with him.   Work up in ED: negative CXR, EKG, cardiology consult, ECHO ordered, labs reviewed, psych consult, r/o COVID.    The patient denies any fever, chills, chest pain, cough, congestion, recent illness, palpitations, or dysuria.       Review of Systems:  A comprehensive review of systems was negative except for that written in the History of Present Illness.     Past Medical History:   Diagnosis Date   ??? A-fib ([place])    ??? Anxiety    ??? Arthritis    ??? [name] (coronary artery disease)    ??? Chronic obstructive pulmonary disease ([place])    ??? Depression    ??? Diabetes ([place])    ??? High cholesterol    ??? Hypertension    ??? MI (myocardial infarction) ([place])    ??? Schizophrenia ([place])       Past Surgical History:    Procedure Laterality Date   ??? HX CORONARY STENT PLACEMENT       Prior to Admission medications    Not on File     Allergies   Allergen Reactions   ??? Bee Venom Protein (Honey Bee) Anaphylaxis   ??? Codeine Nausea and Vomiting   ??? Iodine Hives      History reviewed. No pertinent family history.     SOCIAL HISTORY:  Patient resides:  Independently X   Assisted Living    SNF    With family care       Smoking history:   None    Former    Chronic X     Alcohol history:   None    Social    Chronic X     Ambulates:   Independently X   w/cane    w/walker    w/wc    CODE STATUS:  DNR    Full X   Other      Objective:     Physical Exam:     Visit Vitals  BP 143/75   Pulse 79   Temp 97.7 ??F (36.5 ??C)   Resp 17   Ht '5\' 11"'$  (1.803 m)   Wt 117 kg (258 lb)  SpO2 96%   BMI 35.98 kg/m??      O2 Device: Room air     General:  Alert and oriented x3, cooperative, no distress, appears stated age.   Eyes:  PERRL, EOMs intact.   Nose: Nares normal. Septum midline. Mucosa normal. No drainage or sinus tenderness.   Throat: Lips, mucosa, and tongue normal. Teeth and gums normal.   Neck: Supple, symmetrical, trachea midline, no adenopathy, thyroid: no enlargement/tenderness/nodules, no carotid bruit and no JVD.   Back:   Symmetric, no curvature. ROM normal. No CVA tenderness.   Lungs:   Clear to auscultation bilaterally. Positive for cough and shortness of breath.   Chest wall:  Positive for chest pain. Negative for palpitations and leg swelling. No tenderness or deformity.    Heart:  Regular rate and rhythm, S1, S2 normal, no murmur, click, rub or gallop.   GI/GU:   Soft, non-tender. Bowel sounds normal. No masses,  No organomegaly. Negative for difficulty urinating, dysuria, flank pain and frequency.    Extremities: Extremities normal, atraumatic, no cyanosis or edema.   Pulses: 2+ and symmetric all extremities.   Skin: Skin color, texture, turgor normal. No rashes or lesions   Neurologic: CNII-XII intact.    Psychiatric/Behavioral: Positive for auditory and visual hallucinations. Negative for agitation and confusion.          EKG:  Normal sinus rhythm, Inferior infarct, age undetermined, RBBB.      Data Review:     Recent Days:  Recent Labs     04/07/19  1112   WBC 11.5*   HGB 14.6   HCT 44.6   PLT 228     Recent Labs     04/07/19  1112   NA 137   K 3.8   CL 105   CO2 25   GLU 225*   BUN 15   CREA 1.09   CA 8.6   MG 1.7   ALB 2.9*   ALT 14   INR 0.9     No results for input(s): PH, PCO2, PO2, HCO3, FIO2 in the last 72 hours.    24 Hour Results:  Recent Results (from the past 24 hour(s))   EKG, 12 LEAD, INITIAL    Collection Time: 04/07/19 11:11 AM   Result Value Ref Range    Ventricular Rate 85 BPM    Atrial Rate 85 BPM    P-R Interval 194 ms    QRS Duration 122 ms    Q-T Interval 378 ms    QTC Calculation (Bezet) 449 ms    Calculated P Axis 54 degrees    Calculated R Axis -11 degrees    Calculated T Axis 19 degrees    Diagnosis       Normal sinus rhythm  Right bundle branch block  Inferior infarct , age undetermined  No previous ECGs available     CBC WITH AUTOMATED DIFF    Collection Time: 04/07/19 11:12 AM   Result Value Ref Range    WBC 11.5 (H) 4.1 - 11.1 K/uL    RBC 5.03 4.10 - 5.70 M/uL    HGB 14.6 12.1 - 17.0 g/dL    HCT 44.6 36.6 - 50.3 %    MCV 88.7 80.0 - 99.0 FL    MCH 29.0 26.0 - 34.0 PG    MCHC 32.7 30.0 - 36.5 g/dL    RDW 13.9 11.5 - 14.5 %    PLATELET 228 150 - 400 K/uL    MPV 11.3  8.9 - 12.9 FL    NRBC 0.0 0 PER 100 WBC    ABSOLUTE NRBC 0.00 0.00 - 0.01 K/uL    NEUTROPHILS 77 (H) 32 - 75 %    LYMPHOCYTES 14 12 - 49 %    MONOCYTES 6 5 - 13 %    EOSINOPHILS 2 0 - 7 %    BASOPHILS 0 0 - 1 %    IMMATURE GRANULOCYTES 1 (H) 0.0 - 0.5 %    ABS. NEUTROPHILS 8.8 (H) 1.8 - 8.0 K/UL    ABS. LYMPHOCYTES 1.6 0.8 - 3.5 K/UL    ABS. MONOCYTES 0.7 0.0 - 1.0 K/UL    ABS. EOSINOPHILS 0.2 0.0 - 0.4 K/UL    ABS. BASOPHILS 0.1 0.0 - 0.1 K/UL    ABS. IMM. GRANS. 0.1 (H) 0.00 - 0.04 K/UL    DF AUTOMATED      METABOLIC PANEL, COMPREHENSIVE    Collection Time: 04/07/19 11:12 AM   Result Value Ref Range    Sodium 137 136 - 145 mmol/L    Potassium 3.8 3.5 - 5.1 mmol/L    Chloride 105 97 - 108 mmol/L    CO2 25 21 - 32 mmol/L    Anion gap 7 5 - 15 mmol/L    Glucose 225 (H) 65 - 100 mg/dL    BUN 15 6 - 20 MG/DL    Creatinine 1.09 0.70 - 1.30 MG/DL    BUN/Creatinine ratio 14 12 - 20      GFR est AA >60 >60 ml/min/1.34m    GFR est non-AA >60 >60 ml/min/1.74m   Calcium 8.6 8.5 - 10.1 MG/DL    Bilirubin, total 0.5 0.2 - 1.0 MG/DL    ALT (SGPT) 14 12 - 78 U/L    AST (SGOT) 6 (L) 15 - 37 U/L    Alk. phosphatase 74 45 - 117 U/L    Protein, total 6.7 6.4 - 8.2 g/dL    Albumin 2.9 (L) 3.5 - 5.0 g/dL    Globulin 3.8 2.0 - 4.0 g/dL    A-G Ratio 0.8 (L) 1.1 - 2.2     SAMPLES BEING HELD    Collection Time: 04/07/19 11:12 AM   Result Value Ref Range    SAMPLES BEING HELD 1blu,1red     COMMENT        Add-on orders for these samples will be processed based on acceptable specimen integrity and analyte stability, which may vary by analyte.   TROPONIN I    Collection Time: 04/07/19 11:12 AM   Result Value Ref Range    Troponin-I, Qt. <0.05 <0.05 ng/mL   D DIMER    Collection Time: 04/07/19 11:12 AM   Result Value Ref Range    D-dimer 0.39 0.00 - 0.65 mg/L FEU   ETHYL ALCOHOL    Collection Time: 04/07/19 11:12 AM   Result Value Ref Range    ALCOHOL(ETHYL),SERUM <10 <10 MG/DL   LIPASE    Collection Time: 04/07/19 11:12 AM   Result Value Ref Range    Lipase 124 73 - 393 U/L   MAGNESIUM    Collection Time: 04/07/19 11:12 AM   Result Value Ref Range    Magnesium 1.7 1.6 - 2.4 mg/dL   NT-PRO BNP    Collection Time: 04/07/19 11:12 AM   Result Value Ref Range    NT pro-BNP 99 <125 PG/ML   PROTHROMBIN TIME + INR    Collection Time: 04/07/19 11:12 AM   Result Value Ref Range  INR 0.9 0.9 - 1.1      Prothrombin time 9.8 9.0 - 24.5 sec   SALICYLATE    Collection Time: 04/07/19 11:12 AM   Result Value Ref Range    Salicylate level 3.5 2.8 - 20.0 MG/DL    ACETAMINOPHEN    Collection Time: 04/07/19 11:12 AM   Result Value Ref Range    Acetaminophen level <2 (L) 10 - 30 ug/mL         Imaging:     Assessment:     Active Problems:    Chest pain (04/07/2019)    [name]  DM  [name]  HTN  COPD  N/V  Hallucinations   ETOH Dependence  Tobacco Dependence       Plan:     Chest Pain: chest pain x1 week and worsening since last night with associated N/V and also having shortness of breath and cough. Tight, pressure to left chest radiates to left jaw/neck.  -YKD:XIPJAS sinus rhythm, Inferior infarct, age undetermined, RBBB  -troponin negative x1, trend   -ASA now and daily  -telemetry  -ECHO  -cardiology consult  -cardiac diet    [name]/[name]:  -statin  -lipid panel    DM: off meds x1 week, metformin at home- hold for in patient.  -monitor BGL  -HGA1c  -Insulin sliding scale  -Hold lantus, trend BGL  -DM educator    HTN: unable to recall home meds, lost.   -monitor BP  -prn hydralazine    COPD: current cough and SOB  -monitor O2 sats goal for sats >92%  -CXR negative  -guaifenesin as needed for cough  -albuterol inhaler as needed, only home med- consider maintenance meds on discharge  -r/o COVID 19    N/V: r/t to CP and cough  -prn zofran    Hallucinations: off meds for more than a week, calm and cooperative. Pt states hx of Bipolar and schizophrenia. Auditory and visual hallucinations of deceased wife.   -denies HI and SI  -avoid narcotics/ can not recall home meds  -psych eval for med rec     ETOH Dependence: drinks 12 beers/day.  -CIWA protocol  -no s/s withdrawal    Tobacco Dependence: current smoker 2ppd-1/2ppd.  -not interested in quitting, counseled for cessation  -nicotine patch    Homelessness  -case management consult for resources      DVTppx: Heparin  Gippx: Pepcid  Code Status: Full Code  Diet: Cardiac  Activity: up ad lib  Discharge: TBD           Signed By: Tommye Standard, NP     April 07, 2019

## 2019-04-07 NOTE — Progress Notes (Signed)
1930: Verbal shift change report given to [employer] (oncoming nurse) by Melina Copa (offgoing nurse). Report included the following information SBAR, Kardex, Intake/Output, MAR, Recent Results and Cardiac Rhythm NSR.     11:15 Verbal shift change report given to Marylu Lund RN (oncoming nurse) by Grant Fontana RN (offgoing nurse). Report included the following information SBAR, Kardex, MAR, Accordion, Cardiac Rhythm NSR and Alarm Parameters .

## 2019-04-07 NOTE — ED Provider Notes (Signed)
HPI     Pt is a 68 y.o. M with PMH of DM, [name], HTN, [name], COPD here with c/o chest pain since last night.  He is having N/V associated with chest pain and also having shortness of breath and cough.  He describes pain as heaviness and radiates up to neck.  He is visiting from and says he has been traveling for about 1 days.  He has been out of medications x 4 days.  He restarted drinking alcohol about 2 weeks ago.  He says he doesn't care if he lives.  He is having hallucinations also.      Past Medical History:   Diagnosis Date   ??? A-fib (HCC)    ??? Anxiety    ??? Arthritis    ??? [name] (coronary artery disease)    ??? Chronic obstructive pulmonary disease (HCC)    ??? Depression    ??? Diabetes (HCC)    ??? High cholesterol    ??? Hypertension    ??? MI (myocardial infarction) (HCC)    ??? Schizophrenia (HCC)        Past Surgical History:   Procedure Laterality Date   ??? HX CORONARY STENT PLACEMENT           History reviewed. No pertinent family history.    Social History     Socioeconomic History   ??? Marital status: Not on file     Spouse name: Not on file   ??? Number of children: Not on file   ??? Years of education: Not on file   ??? Highest education level: Not on file   Occupational History   ??? Not on file   Social Needs   ??? Financial resource strain: Not on file   ??? Food insecurity     Worry: Not on file     Inability: Not on file   ??? Transportation needs     Medical: Not on file     Non-medical: Not on file   Tobacco Use   ??? Smoking status: Current Every Day Smoker     Packs/day: 0.50   Substance and Sexual Activity   ??? Alcohol use: Yes   ??? Drug use: Yes     Types: Marijuana   ??? Sexual activity: Not on file   Lifestyle   ??? Physical activity     Days per week: Not on file     Minutes per session: Not on file   ??? Stress: Not on file   Relationships   ??? Social Wellsite geologistconnections     Talks on phone: Not on file     Gets together: Not on file     Attends religious service: Not on file     Active member of club or organization: Not on file      Attends meetings of clubs or organizations: Not on file     Relationship status: Not on file   ??? Intimate partner violence     Fear of current or ex partner: Not on file     Emotionally abused: Not on file     Physically abused: Not on file     Forced sexual activity: Not on file   Other Topics Concern   ??? Not on file   Social History Narrative   ??? Not on file         ALLERGIES: Bee venom protein (honey bee); Codeine; and Iodine    Review of Systems   Constitutional: Negative for chills, diaphoresis  and fever.   HENT: Negative for congestion and trouble swallowing.    Eyes: Negative for photophobia and visual disturbance.   Respiratory: Positive for cough and shortness of breath. Negative for chest tightness.    Cardiovascular: Positive for chest pain. Negative for palpitations and leg swelling.   Gastrointestinal: Negative for abdominal pain, diarrhea, nausea and vomiting.   Genitourinary: Negative for difficulty urinating, dysuria, flank pain and frequency.   Musculoskeletal: Negative for back pain and myalgias.   Skin: Negative for rash and wound.   Neurological: Negative for dizziness, weakness, light-headedness and headaches.   Hematological: Negative for adenopathy. Does not bruise/bleed easily.   Psychiatric/Behavioral: Positive for hallucinations. Negative for agitation and confusion.   All other systems reviewed and are negative.      Vitals:    04/07/19 1112   BP: 145/84   Pulse: 90   Resp: 19   Temp: 97.7 ??F (36.5 ??C)   SpO2: 96%   Weight: 117 kg (258 lb)   Height: 5\' 11"  (1.803 m)            Physical Exam  Vitals signs and nursing note reviewed.   Constitutional:       General: He is not in acute distress.     Appearance: He is well-developed. He is not diaphoretic.   HENT:      Head: Normocephalic.   Eyes:      Conjunctiva/sclera: Conjunctivae normal.      Pupils: Pupils are equal, round, and reactive to light.   Neck:      Musculoskeletal: Normal range of motion and neck supple.      Vascular: No JVD.    Cardiovascular:      Rate and Rhythm: Normal rate and regular rhythm.      Heart sounds: Normal heart sounds.   Pulmonary:      Effort: Pulmonary effort is normal.      Breath sounds: Normal breath sounds.   Abdominal:      General: Bowel sounds are normal. There is no distension.      Palpations: Abdomen is soft.      Tenderness: There is no abdominal tenderness.   Musculoskeletal: Normal range of motion.         General: No tenderness or deformity.   Lymphadenopathy:      Cervical: No cervical adenopathy.   Skin:     General: Skin is warm and dry.      Capillary Refill: Capillary refill takes less than 2 seconds.      Findings: No erythema or rash.   Neurological:      Mental Status: He is alert and oriented to person, place, and time.      Cranial Nerves: No cranial nerve deficit.      Sensory: No sensory deficit.          MDM       Procedures      ED EKG interpretation:  Rhythm: normal sinus rhythm; and regular . Rate (approx.): 85; P wave: normal; QRS interval: prolonged; ST/T wave: no ST elevation or depression. T wave normal.  Possible Q wave inferior.  RBBB.  EKG documented by Lisette Grinder, MD, as interpreted by Lisette Grinder, MD, ED MD.    [facility] for Admission  1:40 PM to Dr. Army Melia    ED Room Number: ER10/10  Patient Name and age:  Derek Mclaughlin 68 y.o.  male  Working Diagnosis:   1. Chest pain, unspecified type  COVID-19 Suspicion:  yes    Code Status:  Full Code  Readmission: no  Isolation Requirements:  yes  Recommended Level of Care:  telemetry  Department:[facility] - [phone]) [phone]  Other:  Chest pain, cough, dyspnea.  Hx of [name], DM and [name] with risk factors for ACS.  Pain is tightening with radiation to jaw.  Pain improved but still present.  Would like admitted for ACS r/o stress etc.  Of note he is alcoholic and has hx schizophrenia.  Off meds x few days.         Vangie Bickerarla Joselyn Edling, MD

## 2019-04-07 NOTE — ACP (Advance Care Planning) (Signed)
Advance Care Planning     Advance Care Planning Activator (Inpatient)  Conversation Note      Date of ACP Conversation: 04/07/19     Conversation Conducted with:   Patient with Decision Making Capacity    ACP Activator: Lowella Dell, MSW    Health Care Decision Maker:  Patient states that he has no one that he trusts or that he would like to use as an emergency contact. He does have a Development worker, international aid who lives in [place], [place]. They have not spoken in many years. He has grandchildren in [place], [place]. He said he "has their number but not on him."    Care Preferences    Ventilation:  "If you were in your present state of health and suddenly became very ill and were unable to breathe on your own, what would your preference be about the use of a ventilator (breathing machine) if it were available to you?"      If patient would desire the use of a ventilator (breathing machine), answer "yes", if not "no":yes    "If your health worsens and it becomes clear that your chance of recovery is unlikely, what would your preference be about the use of a ventilator (breathing machine) if it were available to you?"     Would the patient desire the use of a ventilator (breathing machine)?  NO      Resuscitation  "CPR works best to restart the heart when there is a sudden event, like a heart attack, in someone who is otherwise healthy. Unfortunately, CPR does not typically restart the heart for people who have serious health conditions or who are very sick."    "In the event your heart stopped as a result of an underlying serious health condition, would you want attempts to be made to restart your heart (answer "yes" for attempt to resuscitate) or would you prefer a natural death (answer "no" for do not attempt to resuscitate)?" yes    [x]  Yes  []  No   Educated Patient / Decision Maker regarding differences between Advance Directives and portable DNR orders.    Length of ACP Conversation in minutes:      Conversation  Outcomes:  []  ACP discussion completed  []  Existing advance directive reviewed with patient; no changes to patient's previously recorded wishes     []  New Advance Directive completed   []  Portable Do Not Resuscitate prepared for Provider review and signature  []  POLST/POST/MOLST/MOST prepared for Provider review and signature      Follow-up plan:    [x]  Schedule follow-up conversation to continue planning  []  Referred individual to Provider for additional questions/concerns   []  Advised patient/agent/surrogate to review completed ACP document and update if needed with changes in condition, patient preferences or care setting     [x]  This note routed to one or more involved healthcare providers       Patient is currently COVID-19 rule out. Spoke with patient on the phone. Offered for pastoral care to come talk with him to complete an AMD once COVID-19 tests comes back. Explained if negative, they can speak with him in the room.     Lowella Dell, MSW

## 2019-04-07 NOTE — Consults (Signed)
[place]    Name:  Derek Mclaughlin, COUSER  MR#:  [id number]  DOB:  Jan 04, 1951  ACCOUNT #:  [id number]  DATE OF SERVICE:  04/09/2019    BEHAVIORAL HEALTH CONSULTATION    REASON FOR CONSULTATION:  History of bipolar disorder, schizophrenia, hallucination.    HISTORY OF PRESENT ILLNESS:  The patient is a 68 year old male who is currently being seen on the medical unit for psychiatric consultation for complaints mentioned above.  His admission to the medical unit is well documented in his chart.  His past medical history is significant for DM, [name], hypertension, [name], COPD.  He presented to the emergency room with chest pain.  He had cardiac cath done.  He also has a history of alcohol dependence, and lately, he was drinking 12 beers per day for about two weeks.  He is visiting from [place], and he was supposed to be living with a friend, but when he got there, the house was vacant and he then started living in the motel until he ran out of money, and now he is homeless.  He tells me that he has history of bipolar disorder and schizophrenia and that the only medication that has worked for both of these diagnoses is Xanax.  He stated he was previously on Xanax 2 mg three times a day for two years, and he would like that to be restarted.  He states prior to Xanax, he was on Klonopin and then Valium.  He also fell this morning and states that he is in so much pain.  He is labile during the interview.  He denies withdrawal symptoms at this juncture, CIWA is 0.  According to the note, he was having auditory and visual hallucinations for two weeks.  He hears and sees his dead wife.  When I asked him about this, he said that he was not hallucinating.  He only saw his wife and heard her only one time.  He states that he is in so much pain and needs an IV Ativan and IV morphine, and he is frustrated why he cannot take any of these medications.  I informed him that it is not something that I will  be able to prescribe, but I will be willing to start him on medications to help with his mood.  He was saying on admission that he does not care if he lives, but he denies that with me.  He denies suicidal ideation, homicidal ideation, and auditory or visual hallucination.  Pretty much, the interview was all about him asking for benzodiazepine and morphine.  When I explained to him many times about the risks of being on benzodiazepine, he got angry.  I tried to talk to him about other medications that do not cause the dependency, but he was not fully cooperative with me.  Med seeking behavior is note-worthy.    PAST MEDICAL HISTORY:  See H and P.    Labs: (reviewed/updated 04/10/2019)  Patient Vitals for the past 8 hrs:   BP Temp Pulse Resp SpO2   04/10/19 0805 158/69 98.2 ??F (36.8 ??C) 76 17 94 %   04/10/19 0354 113/72 98.6 ??F (37 ??C) 80 16 98 %     Labs Reviewed   CBC WITH AUTOMATED DIFF - Abnormal; Notable for the following components:       Result Value    WBC 11.5 (*)     NEUTROPHILS 77 (*)     IMMATURE GRANULOCYTES 1 (*)  ABS. NEUTROPHILS 8.8 (*)     ABS. IMM. GRANS. 0.1 (*)     All other components within normal limits   METABOLIC PANEL, COMPREHENSIVE - Abnormal; Notable for the following components:    Glucose 225 (*)     AST (SGOT) 6 (*)     Albumin 2.9 (*)     A-G Ratio 0.8 (*)     All other components within normal limits   ACETAMINOPHEN - Abnormal; Notable for the following components:    Acetaminophen level <2 (*)     All other components within normal limits   FIBRINOGEN - Abnormal; Notable for the following components:    Fibrinogen 553 (*)     All other components within normal limits   D DIMER - Abnormal; Notable for the following components:    D-dimer 1.54 (*)     All other components within normal limits   HEMOGLOBIN A1C WITH EAG - Abnormal; Notable for the following components:    Hemoglobin A1c 9.1 (*)     All other components within normal limits   METABOLIC PANEL, COMPREHENSIVE - Abnormal;  Notable for the following components:    Glucose 193 (*)     GFR est non-AA 59 (*)     Albumin 2.8 (*)     A-G Ratio 0.7 (*)     All other components within normal limits   CBC WITH AUTOMATED DIFF - Abnormal; Notable for the following components:    IMMATURE GRANULOCYTES 1 (*)     ABS. IMM. GRANS. 0.1 (*)     All other components within normal limits   FIBRINOGEN - Abnormal; Notable for the following components:    Fibrinogen 508 (*)     All other components within normal limits   D DIMER - Abnormal; Notable for the following components:    D-dimer 1.47 (*)     All other components within normal limits   LIPID PANEL - Abnormal; Notable for the following components:    Cholesterol, total 201 (*)     Triglyceride 209 (*)     LDL, calculated 123.2 (*)     CHOL/HDL Ratio 5.6 (*)     All other components within normal limits   URINALYSIS W/ RFLX MICROSCOPIC - Abnormal; Notable for the following components:    Protein 100 (*)     Blood LARGE (*)     Mucus TRACE (*)     All other components within normal limits   FIBRINOGEN - Abnormal; Notable for the following components:    Fibrinogen 596 (*)     All other components within normal limits   CBC WITH AUTOMATED DIFF - Abnormal; Notable for the following components:    IMMATURE GRANULOCYTES 1 (*)     ABS. IMM. GRANS. 0.1 (*)     All other components within normal limits   METABOLIC PANEL, BASIC - Abnormal; Notable for the following components:    Potassium 3.4 (*)     Glucose 140 (*)     Calcium 8.2 (*)     All other components within normal limits   FIBRINOGEN - Abnormal; Notable for the following components:    Fibrinogen 574 (*)     All other components within normal limits   METABOLIC PANEL, BASIC - Abnormal; Notable for the following components:    Sodium 135 (*)     Glucose 195 (*)     BUN 21 (*)     GFR est non-AA 56 (*)  All other components within normal limits   GLUCOSE, POC - Abnormal; Notable for the following components:    Glucose (POC) 156 (*)     All other  components within normal limits   GLUCOSE, POC - Abnormal; Notable for the following components:    Glucose (POC) 218 (*)     All other components within normal limits   GLUCOSE, POC - Abnormal; Notable for the following components:    Glucose (POC) 168 (*)     All other components within normal limits   GLUCOSE, POC - Abnormal; Notable for the following components:    Glucose (POC) 320 (*)     All other components within normal limits   GLUCOSE, POC - Abnormal; Notable for the following components:    Glucose (POC) 212 (*)     All other components within normal limits   GLUCOSE, POC - Abnormal; Notable for the following components:    Glucose (POC) 143 (*)     All other components within normal limits   GLUCOSE, POC - Abnormal; Notable for the following components:    Glucose (POC) 150 (*)     All other components within normal limits   GLUCOSE, POC - Abnormal; Notable for the following components:    Glucose (POC) 155 (*)     All other components within normal limits   GLUCOSE, POC - Abnormal; Notable for the following components:    Glucose (POC) 147 (*)     All other components within normal limits   GLUCOSE, POC - Abnormal; Notable for the following components:    Glucose (POC) 142 (*)     All other components within normal limits   GLUCOSE, POC - Abnormal; Notable for the following components:    Glucose (POC) 168 (*)     All other components within normal limits   GLUCOSE, POC - Abnormal; Notable for the following components:    Glucose (POC) 198 (*)     All other components within normal limits   CULTURE, RESPIRATORY/SPUTUM/BRONCH W GRAM STAIN   ENTERIC BACTERIA PANEL, DNA   SAMPLES BEING HELD   TROPONIN I   D DIMER   ETHYL ALCOHOL   LIPASE   MAGNESIUM   NT-PRO BNP   PROTHROMBIN TIME + INR   SALICYLATE   SARS-COV-2   PROTHROMBIN TIME + INR   PTT   LD   FERRITIN   TROPONIN I   PROTHROMBIN TIME + INR   PTT   TROPONIN I   *UA&MICRO CHARGE BAT   PROTHROMBIN TIME + INR   PTT   D DIMER   WBC, STOOL   PROTHROMBIN  TIME + INR   PTT   D DIMER     Lab Results   Component Value Date/Time    Sodium 135 (L) 04/10/2019 03:14 AM    Potassium 4.0 04/10/2019 03:14 AM    Chloride 104 04/10/2019 03:14 AM    CO2 22 04/10/2019 03:14 AM    Anion gap 9 04/10/2019 03:14 AM    Glucose 195 (H) 04/10/2019 03:14 AM    BUN 21 (H) 04/10/2019 03:14 AM    Creatinine 1.28 04/10/2019 03:14 AM    BUN/Creatinine ratio 16 04/10/2019 03:14 AM    GFR est AA >60 04/10/2019 03:14 AM    GFR est non-AA 56 (L) 04/10/2019 03:14 AM    Calcium 8.7 04/10/2019 03:14 AM    Bilirubin, total 0.3 04/08/2019 12:29 AM    Alk. phosphatase 76 04/08/2019 12:29 AM  Protein, total 6.7 04/08/2019 12:29 AM    Albumin 2.8 (L) 04/08/2019 12:29 AM    Globulin 3.9 04/08/2019 12:29 AM    A-G Ratio 0.7 (L) 04/08/2019 12:29 AM    ALT (SGPT) 15 04/08/2019 12:29 AM     No results displayed because visit has over 200 results.        Vitals:    04/09/19 2014 04/10/19 0016 04/10/19 0354 04/10/19 0805   BP: 137/75 132/84 113/72 158/69   Pulse: 85 86 80 76   Resp: 16 16 16 17    Temp: 98.2 ??F (36.8 ??C) 98.6 ??F (37 ??C) 98.6 ??F (37 ??C) 98.2 ??F (36.8 ??C)   SpO2: 98% 98% 98% 94%   Weight:       Height:         Recent Results (from the past 24 hour(s))   GLUCOSE, POC    Collection Time: 04/09/19 11:10 AM   Result Value Ref Range    Glucose (POC) 147 (H) 65 - 100 mg/dL    Performed by Rhae Lerner    GLUCOSE, POC    Collection Time: 04/09/19  4:25 PM   Result Value Ref Range    Glucose (POC) 142 (H) 65 - 100 mg/dL    Performed by Delman Cheadle    GLUCOSE, POC    Collection Time: 04/09/19  9:17 PM   Result Value Ref Range    Glucose (POC) 168 (H) 65 - 100 mg/dL    Performed by [facility]  PCT    PROTHROMBIN TIME + INR    Collection Time: 04/10/19  3:14 AM   Result Value Ref Range    INR 1.0 0.9 - 1.1      Prothrombin time 9.9 9.0 - 11.1 sec   PTT    Collection Time: 04/10/19  3:14 AM   Result Value Ref Range    aPTT 27.1 22.1 - 32.0 sec    aPTT, therapeutic range     58.0 - 77.0 SECS    FIBRINOGEN    Collection Time: 04/10/19  3:14 AM   Result Value Ref Range    Fibrinogen 574 (H) 200 - 475 mg/dL   D DIMER    Collection Time: 04/10/19  3:14 AM   Result Value Ref Range    D-dimer 0.49 0.00 - 0.65 mg/L FEU   METABOLIC PANEL, BASIC    Collection Time: 04/10/19  3:14 AM   Result Value Ref Range    Sodium 135 (L) 136 - 145 mmol/L    Potassium 4.0 3.5 - 5.1 mmol/L    Chloride 104 97 - 108 mmol/L    CO2 22 21 - 32 mmol/L    Anion gap 9 5 - 15 mmol/L    Glucose 195 (H) 65 - 100 mg/dL    BUN 21 (H) 6 - 20 MG/DL    Creatinine 1.28 0.70 - 1.30 MG/DL    BUN/Creatinine ratio 16 12 - 20      GFR est AA >60 >60 ml/min/1.41m    GFR est non-AA 56 (L) >60 ml/min/1.726m   Calcium 8.7 8.5 - 10.1 MG/DL   GLUCOSE, POC    Collection Time: 04/10/19  6:35 AM   Result Value Ref Range    Glucose (POC) 198 (H) 65 - 100 mg/dL    Performed by WaMadilyn FiremanPCT        RADIOLOGY REPORTS:  Results from Hospital Encounter encounter on 04/07/19   XR SHOULDER RT AP/LAT MIN 2  V    Narrative EXAM: XR SHOULDER RT AP/LAT MIN 2 V    INDICATION: fall.    COMPARISON: None.    FINDINGS: Three views of the right shoulder demonstrate no fracture, dislocation  or other acute abnormality. There is degenerative disease.      Impression IMPRESSION: No acute abnormality.   Xr Chest Pa Lat    Result Date: 04/07/2019  Indication: Chest pain. Exam: AP upright and lateral views of the chest. There is no prior study for direct comparison. Findings: Cardiomediastinal silhouette is within normal limits. Lungs are clear bilaterally. Pleural spaces are normal. Osseous structures are intact.     IMPRESSION: No acute cardiopulmonary disease.      Xr Shoulder Rt Ap/lat Min 2 V    Result Date: 04/09/2019  EXAM: XR SHOULDER RT AP/LAT MIN 2 V INDICATION: fall. COMPARISON: None. FINDINGS: Three views of the right shoulder demonstrate no fracture, dislocation or other acute abnormality. There is degenerative disease.     IMPRESSION: No acute  abnormality.    Ct Head Wo Cont    Result Date: 04/09/2019  EXAMINATION:  CT HEAD WO CONT CLINICAL INFORMATION:  Fall COMPARISON:  None. TECHNIQUE: Routine axial head CT was performed. IV contrast was not administered.   Sagittal and coronal reconstructions were generated.  CT dose reduction was achieved through use of a standardized protocol tailored for this examination and automatic exposure control for dose modulation. Adaptive statistical iterative reconstruction (ASIR) was utilized. FINDINGS: No acute infarct, hemorrhage or mass. VENTRICULAR SYSTEM:  Normal for age. BASAL CISTERNS:  Patent. BRAIN PARENCHYMA:  No significant abnormalities. MIDLINE SHIFT:  None. CALVARIUM/ SKULL BASE: Intact. PARANASAL SINUSES AND MASTOID AIR CELLS: Probable retention cyst in the posterior left sphenoid sinus. Partial opacification of the left anterior ethmoid air cells. Asymmetric density in the left nasal cavity which could represent a polypoid lesion. Postsurgical changes with apparent prior resection of the left lateral nasal wall and superior aspect of the left maxillary sinus, with formation of any ostium. VISUALIZED ORBITS: No significant abnormalities. SELLA: No enlargement.     IMPRESSION:  1. No significant intracranial abnormalities. No evidence of acute intracranial injury. 2. Postsurgical changes in the left maxillary sinus. Asymmetric density in the left nasal cavity. A polypoid lesion may be present. Clinical correlation is recommended.     Ct Spine Cerv Wo Cont    Result Date: 04/09/2019  EXAM:  CT CERVICAL SPINE WITHOUT CONTRAST INDICATION: fall. COMPARISON: None. CONTRAST:  None. TECHNIQUE: Multislice helical CT of the cervical spine was performed without intravenous contrast administration.  Sagittal and coronal reformats were generated.  CT dose reduction was achieved through use of a standardized protocol tailored for this examination and automatic exposure control for dose modulation. FINDINGS: The alignment  is within normal limits. The craniocervical junction is within normal limits. The odontoid process and C1-2 articulation are intact. There is a displaced avulsion fracture of the tip of the T1 spinous process. At the base of the fracture, there appears to be bone sclerosis and there is density traversing the fracture site. There is also a lack of overlying soft tissue swelling. Imaging features are most suggestive of a chronic fracture. There are no definite acute fractures. There is prominent anterior paravertebral ossification at C5-6 and C6-7, and to a lesser extent at C3-4 and C4-5. There is a probable retention cyst in the posterior aspect of the left sphenoid air cell. C2-C3: There is no spinal canal or neural foraminal stenosis. C3-C4: There is  no spinal canal or significant foraminal stenosis. There is moderate right and mild left facet hypertrophy.. C4-C5: There is no spinal canal or neural foraminal stenosis. There is moderate right and mild left facet hypertrophy. C5-C6: There is no spinal canal or neural foraminal stenosis. There is mild to moderate right facet hypertrophy. C6-C7: There is no spinal canal or neural foraminal stenosis. C7-T1: There is no spinal canal or neural foraminal stenosis.     IMPRESSION: 1. Avulsion fracture of the tip of the T1 spinous process, probably chronic. Clinical correlation with mechanism of injury and location of tenderness is recommended. 2. Degenerative changes with facet joint hypertrophy predominantly on the right and with anterior paravertebral ossification. No significant central or foraminal stenosis.    [place] Abd Comp    Result Date: 04/09/2019  EXAM: Ultrasound of the abdomen. INDICATION:  abd pain TECHNIQUE: High-resolution grayscale and selected color flow/Doppler evaluation of the abdomen performed. COMPARISON:  None. FINDINGS: Liver:  Normal size with diffuse increase in echotexture and no focal lesion. Normal hepatopedal flow in the portal vein. Gallbladder:   Normal.  There is no gallbladder stone, distention, pericholecystic fluid, sonographic Murphy's sign, or gallbladder wall thickening. Bile ducts:  Normal.  CBD 5.4 mm. No intrahepatic biliary dilation. Spleen:  Normal. Kidneys:  Bilaterally echogenic with no stone, mass, or hydronephrosis.  Kidneys measure 11.6 cm on the right and 11.2 cm on the left. Pancreas:  Obscured by bowel gas and not well evaluated. Abdominal aorta:  Obscured by body habitus/bowel gas and not well evaluated. IVC:  Visualized portions normal. Misc:  No pleural effusion or ascites identified.     IMPRESSION:  1. Increased hepatic echogenicity compatible with diffuse hepatocellular process, most commonly seen in steatosis. 2. Echogenic appearance of the kidneys may reflect medical renal disease. 3. Pancreas and aorta not well evaluated due to obscuration by overlying bowel gas.       PAST PSYCHIATRIC HISTORY:  He stated he was seeing a psychiatrist in [place] and was prescribed Xanax 2 mg three times a day and prior to that was also prescribed Klonopin and Valium.    PSYCHOSOCIAL HISTORY:  He is a widow.  He has a daughter.  He is homeless.    MENTAL STATUS EXAMINATION:  He is currently lying in bed, dressed in street clothes.  He is labile during the interview.  He reports his mood is not good.  Affect is constricted.  Speech normal rate and rhythm.  Thought process:  Logical and goal-directed.  He denies suicidal ideation, homicidal ideation, auditory or visual hallucination.  No paranoia or delusions.  Memory is intact.  Intelligence is average.  Insight is poor.  Judgment is poor.    ASSESSMENT AND PLAN:  The patient meets the criteria for unspecified mood disorder.  I informed him that benzodiazepine is not something that we will be able to prescribe.  I tried to talk to him about putting him on other classic antidepressants and/or mood stabilizers, but he was not forthcoming.  He said that he has tried all those medications, it did not  work, and Xanax was the only medication that worked for him.  Unfortunately, I was not able to come up with a definite plan with him because he kept asking for benzodiazepine.  If he later changes his mind, please consider starting him on Cymbalta 20 mg daily to help with the depression, anxiety and pain.  Please also consider Seroquel 25 mg every six hours as needed for hallucinations.  Please continue CIWA protocol.  He can followup with the local [facility], either [place] or [place] ([place] for outpatient services.  There is no indication for inpatient psychiatric admission.  Please discharge to home once medically stable.    Thank you for this consult.  Please call with questions.      Quintasha Gren A Gracielynn Birkel, NP      [id number]  D:  04/09/2019 17:18  T:  04/09/2019 20:26  JOB #:  [id number]

## 2019-04-07 NOTE — Progress Notes (Signed)
Bedside shift change report given to Mulan, RN  (oncoming nurse) by Melina Copa (offgoing nurse). Report included the following information SBAR, Kardex, Intake/Output, MAR, Accordion and Recent Results.     Problem: Unstable angina/NSTEMI: Day of Admission/Day 1  Goal: Activity/Safety  Outcome: Progressing Towards Goal  Goal: Diagnostic Test/Procedures  Outcome: Progressing Towards Goal      On bed alarm. Encouraged to increase time OOB. VVS.

## 2019-04-07 NOTE — ED Notes (Signed)
Patient comes to the ER via EMS c/o chest pain, sob and cough for the past couple of days. Reports coming to [place] from [place] on [employer]ound bus and accidentally leaving medicine on  Bus. Has been without medicine x4 days    Reports he is a schizophrenic and has been having auditory hallucinations. +Tearful  Reports decreased will to live  Reports being sober for 6 years and stating drinking again 2 weeks ago along with marijuana. Reports drinking everyday for 2 weeks    Given 1 SL NTG and 1 ASA en route, reports no relief

## 2019-04-07 NOTE — H&P (Signed)
H&P by Tommye Standard, NP at  04/07/19 1439                Author: Tommye Standard, NP  Service: Nurse Practitioner  Author Type: Nurse Practitioner       Filed: 04/07/19 5366  Date of Service: 04/07/19 1439  Status: Addendum          Editor: Tommye Standard, NP (Nurse Practitioner)       Related Notes: Original Note by Tommye Standard, NP (Nurse Practitioner) filed at 04/07/19 1527          Cosigner: Toy Baker, MD at 04/18/19 1701                                                        History & Physical      Primary Care Provider: Lacey Jensen, MD   Source of Information: Patient patient.        History of Presenting Illness:       Derek Mclaughlin is a 68 y.o.  male who presents with PMH of DM, [name], HTN, [name], COPD here with c/o chest pain x1 week and worsening since last night with associated N/V and  also having shortness of breath and cough. He describes pain as tight heaviness to left side of chest and radiates up to neck and jaw.  He is visiting from [place] and states he was supposed to living with friends when he got here but the house was  vacant when he arrived. Has lived in [place] until he ran out of money now homeless. He has been out of medications for about 1 week prior to coming to ED. He states he has a history of alcohol dependence and restarted drinking alcohol about 2 weeks prior  to coming to ED and drinks 12 beers/day. He states he has a hx of bipolar and schizophrenia and has had an increase in symptoms of depression over last few months, he stated he doesn't care if he lives but denies suicidal and homicidal ideations.He states  he is having auditory and visual hallucinations x2 weeks where he hears his deceased wifes voice and thinks he has seen her in a room with him.    Work up in ED: negative CXR, EKG, cardiology consult, ECHO ordered, labs reviewed, psych consult, r/o COVID.      The patient denies any fever, chills, chest pain, cough, congestion, recent illness, palpitations, or  dysuria.           Review of Systems:   A comprehensive review of systems was negative except for that written in the History of Present Illness.          Past Medical History:        Diagnosis  Date         ?  A-fib ([place])       ?  Anxiety       ?  Arthritis       ?  [name] (coronary artery disease)       ?  Chronic obstructive pulmonary disease (HCC)       ?  Depression       ?  Diabetes ([place])       ?  High cholesterol       ?  Hypertension       ?  MI (myocardial infarction) ([place])           ?  Schizophrenia [facility])             Past Surgical History:         Procedure  Laterality  Date          ?  HX CORONARY STENT PLACEMENT              Prior to Admission medications        Not on File          Allergies        Allergen  Reactions         ?  Bee Venom Protein (Honey Bee)  Anaphylaxis     ?  Codeine  Nausea and Vomiting         ?  Iodine  Hives         History reviewed. No pertinent family history.       SOCIAL HISTORY:   Patient resides:      Independently  X     Assisted Living       SNF       With family care           Smoking history:       None       Former       Chronic  X        Alcohol history:       None       Social       Chronic  X        Ambulates:       Independently  X     w/cane          w/walker          w/wc       CODE STATUS:      DNR       Full  X     Other            Objective:        Physical Exam:       Visit Vitals      BP  143/75     Pulse  79     Temp  97.7 ??F (36.5 ??C)     Resp  17     Ht  5' 11" (1.803 m)     Wt  117 kg (258 lb)     SpO2  96%        BMI  35.98 kg/m??         O2 Device: Room air          General:   Alert and oriented x3, cooperative, no distress, appears stated age.        Eyes:   PERRL, EOMs intact.        Nose:  Nares normal. Septum midline. Mucosa normal. No drainage or sinus tenderness.        Throat:  Lips, mucosa, and tongue normal. Teeth and gums normal.        Neck:  Supple, symmetrical, trachea midline, no adenopathy, thyroid: no enlargement/tenderness/nodules, no  carotid bruit and no JVD.     Back:    Symmetric, no curvature. ROM normal. No CVA tenderness.     Lungs:    Clear to auscultation bilaterally. Positive for cough  and shortness of breath.        Chest wall:   Positive for  chest pain. Negative for palpitations and  leg swelling. No tenderness or deformity.         Heart:   Regular rate and rhythm, S1, S2 normal, no murmur, click, rub or gallop.        GI/GU:    Soft, non-tender. Bowel sounds normal. No masses,  No organomegaly. Negative for difficulty urinating, dysuria, flank pain and frequency.      Extremities:  Extremities normal, atraumatic, no cyanosis or edema.     Pulses:  2+ and symmetric all extremities.     Skin:  Skin color, texture, turgor normal. No rashes or lesions     Neurologic:  CNII-XII intact.    Psychiatric/Behavioral: Positive for auditory and visual  hallucinations. Negative for agitation and confusion.               EKG:  Normal sinus rhythm, Inferior infarct, age undetermined, RBBB.         Data Review:       Recent Days:     Recent Labs           04/07/19   1112     WBC  11.5*     HGB  14.6     HCT  44.6        PLT  228          Recent Labs           04/07/19   1112     NA  137     K  3.8     CL  105     CO2  25     GLU  225*     BUN  15     CREA  1.09     CA  8.6     MG  1.7     ALB  2.9*     ALT  14        INR  0.9        No results for input(s): PH, PCO2, PO2, HCO3, FIO2 in the last 72 hours.      24 Hour Results:     Recent Results (from the past 24 hour(s))     EKG, 12 LEAD, INITIAL          Collection Time: 04/07/19 11:11 AM         Result  Value  Ref Range            Ventricular Rate  85  BPM       Atrial Rate  85  BPM       P-R Interval  194  ms       QRS Duration  122  ms       Q-T Interval  378  ms       QTC Calculation (Bezet)  449  ms       Calculated P Axis  54  degrees       Calculated R Axis  -11  degrees       Calculated T Axis  19  degrees       Diagnosis                 Normal sinus rhythm   Right bundle branch block    Inferior infarct , age undetermined   No previous ECGs available          CBC WITH AUTOMATED DIFF          Collection Time: 04/07/19  11:12 AM         Result  Value  Ref Range            WBC  11.5 (H)  4.1 - 11.1 K/uL       RBC  5.03  4.10 - 5.70 M/uL       HGB  14.6  12.1 - 17.0 g/dL       HCT  44.6  36.6 - 50.3 %       MCV  88.7  80.0 - 99.0 FL       MCH  29.0  26.0 - 34.0 PG       MCHC  32.7  30.0 - 36.5 g/dL       RDW  13.9  11.5 - 14.5 %       PLATELET  228  150 - 400 K/uL       MPV  11.3  8.9 - 12.9 FL       NRBC  0.0  0 PER 100 WBC       ABSOLUTE NRBC  0.00  0.00 - 0.01 K/uL       NEUTROPHILS  77 (H)  32 - 75 %       LYMPHOCYTES  14  12 - 49 %       MONOCYTES  6  5 - 13 %       EOSINOPHILS  2  0 - 7 %       BASOPHILS  0  0 - 1 %       IMMATURE GRANULOCYTES  1 (H)  0.0 - 0.5 %       ABS. NEUTROPHILS  8.8 (H)  1.8 - 8.0 K/UL       ABS. LYMPHOCYTES  1.6  0.8 - 3.5 K/UL       ABS. MONOCYTES  0.7  0.0 - 1.0 K/UL       ABS. EOSINOPHILS  0.2  0.0 - 0.4 K/UL       ABS. BASOPHILS  0.1  0.0 - 0.1 K/UL       ABS. IMM. GRANS.  0.1 (H)  0.00 - 0.04 K/UL       DF  AUTOMATED          METABOLIC PANEL, COMPREHENSIVE          Collection Time: 04/07/19 11:12 AM         Result  Value  Ref Range            Sodium  137  136 - 145 mmol/L       Potassium  3.8  3.5 - 5.1 mmol/L       Chloride  105  97 - 108 mmol/L       CO2  25  21 - 32 mmol/L       Anion gap  7  5 - 15 mmol/L       Glucose  225 (H)  65 - 100 mg/dL       BUN  15  6 - 20 MG/DL       Creatinine  1.09  0.70 - 1.30 MG/DL       BUN/Creatinine ratio  14  12 - 20         GFR est AA  >60  >60 ml/min/1.77m       GFR est non-AA  >60  >60 ml/min/1.76m      Calcium  8.6  8.5 - 10.1 MG/DL  Bilirubin, total  0.5  0.2 - 1.0 MG/DL       ALT (SGPT)  14  12 - 78 U/L       AST (SGOT)  6 (L)  15 - 37 U/L       Alk. phosphatase  74  45 - 117 U/L       Protein, total  6.7  6.4 - 8.2 g/dL       Albumin  2.9 (L)  3.5 - 5.0 g/dL       Globulin  3.8  2.0 - 4.0 g/dL       A-G Ratio  0.8  (L)  1.1 - 2.2         SAMPLES BEING HELD          Collection Time: 04/07/19 11:12 AM         Result  Value  Ref Range            SAMPLES BEING HELD  1blu,1red         COMMENT                  Add-on orders for these samples will be processed based on acceptable specimen integrity and analyte stability, which may vary by analyte.       TROPONIN I          Collection Time: 04/07/19 11:12 AM         Result  Value  Ref Range            Troponin-I, Qt.  <0.05  <0.05 ng/mL       D DIMER          Collection Time: 04/07/19 11:12 AM         Result  Value  Ref Range            D-dimer  0.39  0.00 - 0.65 mg/L FEU       ETHYL ALCOHOL          Collection Time: 04/07/19 11:12 AM         Result  Value  Ref Range            ALCOHOL(ETHYL),SERUM  <10  <10 MG/DL       LIPASE          Collection Time: 04/07/19 11:12 AM         Result  Value  Ref Range            Lipase  124  73 - 393 U/L       MAGNESIUM          Collection Time: 04/07/19 11:12 AM         Result  Value  Ref Range            Magnesium  1.7  1.6 - 2.4 mg/dL       NT-PRO BNP          Collection Time: 04/07/19 11:12 AM         Result  Value  Ref Range            NT pro-BNP  99  <125 PG/ML       PROTHROMBIN TIME + INR          Collection Time: 04/07/19 11:12 AM         Result  Value  Ref Range            INR  0.9  0.9 - 1.1  Prothrombin time  9.8  9.0 - 70.3 sec       SALICYLATE          Collection Time: 04/07/19 11:12 AM         Result  Value  Ref Range            Salicylate level  3.5  2.8 - 20.0 MG/DL       ACETAMINOPHEN          Collection Time: 04/07/19 11:12 AM         Result  Value  Ref Range            Acetaminophen level  <2 (L)  10 - 30 ug/mL              Imaging:         Assessment:          Active Problems:     Chest pain (04/07/2019)      [name]   DM   [name]   HTN   COPD   N/V   Hallucinations    ETOH Dependence   Tobacco Dependence             Plan:          Chest Pain: chest pain x1 week and worsening since last night with associated N/V and also having  shortness of breath and cough. Tight, pressure to left  chest radiates to left jaw/neck.   -JKK:[id number] sinus rhythm, Inferior infarct, age undetermined, RBBB   -troponin negative x1, trend    -ASA now and daily   -telemetry   -ECHO   -cardiology consult   -cardiac diet      [name]/[name]:   -statin   -lipid panel      DM: off meds x1 week, metformin at home- hold for in patient.   -monitor BGL   -HGA1c   -Insulin sliding scale   -Hold lantus, trend BGL   -DM educator      HTN: unable to recall home meds, lost.    -monitor BP   -prn hydralazine      COPD: current cough and SOB   -monitor O2 sats goal for sats >92%   -CXR negative   -guaifenesin as needed for cough   -albuterol inhaler as needed, only home med- consider maintenance meds on discharge   -r/o COVID 19      N/V: r/t to CP and cough   -prn zofran      Hallucinations: off meds for more than a week, calm and cooperative. Pt states hx of Bipolar and schizophrenia. Auditory and visual hallucinations of deceased wife.    -denies HI and SI   -avoid narcotics/ can not recall home meds   -psych eval for med rec       ETOH Dependence: drinks 12 beers/day.   -CIWA protocol   -no s/s withdrawal      Tobacco Dependence: current smoker 2ppd-1/2ppd.   -not interested in quitting, counseled for cessation   -nicotine patch      Homelessness   -case management consult for resources         DVTppx: Heparin   Gippx: Pepcid   Code Status: Full Code   Diet: Cardiac   Activity: up ad lib   Discharge: TBD                    Signed By:  Tommye Standard, NP        April 07, 2019

## 2019-04-07 NOTE — Progress Notes (Signed)
 Care Management:    Transition of Care Plan:      RUR: not listed (observation status)   Disposition: homeless shelter or alcohol rehab   Transportation: bus or Lyft      14:35 Patient in ED with COVID-19 rule out. Called patient in ED -room on his cell phone [phone]) in attempt to complete CM assessment and initiate ACP conversation. Received voice mail. Left voice mail requesting a call back.     Per chart review, patient recently to [place] from [place], [place]. Per H&P, patient came to [place] to live with friends, but when he arrived at the address, no one was there. He has been staying in a hotel until his money ran out. Per review of Encounters, patient was seeing a PCP at [facility] in [place], [place]. His visits begin December 2019 and last visit was 03-27-19. Per review of those visits, patient said he came to [facility] from [place]. While in [place], he lived in a homeless shelter at times and a boarding house other times.     Ellouise FORBES Corns, MSW     16:33 Addendum: Called patient in his hospital room to complete initial assessment and begin ACP conversation. Attempted to explain who I was and role. Patient said he could not hear this CM due to the noise from the negative pressure machine in the room and him being hard of hearing. Patient stated he would need to speak in person. He hung up. CM will await results of pending COVID-19 test.     Called nurse's station. Spoke with Diplomatic Services operational officer. Patient just arrived to floor. RN is completing assessment now. Requested secretary asked RN to try to obtain an emergency contact person for patient. RN spoke with patient. Patient told her that he does not have an emergency contact. Asked that nurse continue to try to obtain a phone number for patient tonight.     Ellouise FORBES Corns, MSW     16:58 Addendum: Patient called this CM back. He said that he had stayed with a friend and his wife in [place] last summer. They said he could stay with them  again. When he got his check on June 1st, he sent them $1000. When he arrived at their address, there was a for sale sign in the yard and no one was in the house. He then went to stay in a hotel. His money has now ran out. He will need a place to go at time of discharge.     Patient stated he had been sober for 9 years, until 2 weeks ago. He began drinking 2 weeks ago and does not wish to continue. Patient said that he would like to a rehab center for 28 days of rehab if possible. He said he called his insurance and they do pay for that. Per chart review, patient has [employer] Medicare Complete.    Reason for Admission:   Chest pain                    RUR Score:         not listed (observation status)           Plan for utilizing home health:      none    PCP: NO PCP.                       Current Advanced Directive/Advance Care Plan: Patient states that he has no one  that he trusts or that he would like to use as an emergency contact. He does have a Development worker, international aid who lives in [place], [place]. They have not spoken in many years. He has grandchildren in [place], [place] . He said he has their number but not on him. Patient is agreeable to completing AMD, but does not have anyone to place as mPOA.                         Care Management Interventions  PCP Verified by CM: Yes([facility] in [place])  Last Visit to PCP: 04/27/19  Mode of Transport at Discharge: Other (see comment)(Lyft vs bus)  Transition of Care Consult (CM Consult): Discharge Planning  Discharge Durable Medical Equipment: No  Physical Therapy Consult: No  Occupational Therapy Consult: No  Speech Therapy Consult: No  Current Support Network: Other(Homeless)  [employer] Provided?: No  Discharge Location  Discharge Placement: Shelter    Ellouise FORBES Corns, MSW

## 2019-04-08 ENCOUNTER — Observation Stay: Admit: 2019-04-08 | Payer: MEDICARE | Primary: Family Medicine

## 2019-04-08 ENCOUNTER — Inpatient Hospital Stay: Admit: 2019-04-08 | Payer: MEDICARE | Primary: Family Medicine

## 2019-04-08 LAB — CBC WITH AUTO DIFFERENTIAL
Basophils %: 1 % (ref 0–1)
Basophils Absolute: 0.1 10*3/uL (ref 0.0–0.1)
Eosinophils %: 2 % (ref 0–7)
Eosinophils Absolute: 0.2 10*3/uL (ref 0.0–0.4)
Granulocyte Absolute Count: 0.1 10*3/uL — ABNORMAL HIGH (ref 0.00–0.04)
Hematocrit: 42.5 % (ref 36.6–50.3)
Hemoglobin: 13.8 g/dL (ref 12.1–17.0)
Immature Granulocytes: 1 % — ABNORMAL HIGH (ref 0.0–0.5)
Lymphocytes %: 26 % (ref 12–49)
Lymphocytes Absolute: 2.8 10*3/uL (ref 0.8–3.5)
MCH: 28.9 PG (ref 26.0–34.0)
MCHC: 32.5 g/dL (ref 30.0–36.5)
MCV: 89.1 FL (ref 80.0–99.0)
MPV: 12.7 FL (ref 8.9–12.9)
Monocytes %: 7 % (ref 5–13)
Monocytes Absolute: 0.8 10*3/uL (ref 0.0–1.0)
NRBC Absolute: 0 10*3/uL (ref 0.00–0.01)
Neutrophils %: 63 % (ref 32–75)
Neutrophils Absolute: 7 10*3/uL (ref 1.8–8.0)
Nucleated RBCs: 0 PER 100 WBC
Platelets: 277 10*3/uL (ref 150–400)
RBC: 4.77 M/uL (ref 4.10–5.70)
RDW: 14.4 % (ref 11.5–14.5)
WBC: 11 10*3/uL (ref 4.1–11.1)

## 2019-04-08 LAB — LIPID PANEL
CHOL/HDL Ratio: 5.6 — ABNORMAL HIGH (ref 0.0–5.0)
Chol/HDL Ratio: 5.6 — ABNORMAL HIGH (ref 0.0–5.0)
Cholesterol, Total: 201 MG/DL — ABNORMAL HIGH (ref <200)
Cholesterol, total: 201 MG/DL — ABNORMAL HIGH (ref <200)
HDL Cholesterol: 36 MG/DL
HDL: 36 MG/DL
LDL Calculated: 123.2 MG/DL — ABNORMAL HIGH (ref 0–100)
LDL, calculated: 123.2 MG/DL — ABNORMAL HIGH (ref 0–100)
Triglyceride: 209 MG/DL — ABNORMAL HIGH (ref <150)
Triglycerides: 209 MG/DL — ABNORMAL HIGH (ref <150)
VLDL Cholesterol Calculated: 41.8 MG/DL
VLDL, calculated: 41.8 MG/DL

## 2019-04-08 LAB — COMPREHENSIVE METABOLIC PANEL
ALT: 15 U/L (ref 12–78)
AST: 18 U/L (ref 15–37)
Albumin/Globulin Ratio: 0.7 — ABNORMAL LOW (ref 1.1–2.2)
Albumin: 2.8 g/dL — ABNORMAL LOW (ref 3.5–5.0)
Alkaline Phosphatase: 76 U/L (ref 45–117)
Anion Gap: 9 mmol/L (ref 5–15)
BUN: 19 MG/DL (ref 6–20)
Bun/Cre Ratio: 15 (ref 12–20)
CO2: 25 mmol/L (ref 21–32)
Calcium: 8.5 MG/DL (ref 8.5–10.1)
Chloride: 103 mmol/L (ref 97–108)
Creatinine: 1.23 MG/DL (ref 0.70–1.30)
EGFR IF NonAfrican American: 59 mL/min/{1.73_m2} — ABNORMAL LOW (ref >60)
GFR African American: >60 mL/min/{1.73_m2} (ref >60)
Globulin: 3.9 g/dL (ref 2.0–4.0)
Glucose: 193 mg/dL — ABNORMAL HIGH (ref 65–100)
Potassium: 4.4 mmol/L (ref 3.5–5.1)
Sodium: 137 mmol/L (ref 136–145)
Total Bilirubin: 0.3 MG/DL (ref 0.2–1.0)
Total Protein: 6.7 g/dL (ref 6.4–8.2)

## 2019-04-08 LAB — TRANSTHORACIC ECHOCARDIOGRAM (TTE) COMPLETE (CONTRAST/BUBBLE/3D PRN)
Aortic Root: 3.78 cm
Fractional Shortening 2D: 29.232 %
IVC Sniffing: 1.8 cm
IVSd: 1.26 cm — AB (ref 0.6–1)
LA Major Axis: 4.22 cm
LA/AO Root Ratio: 1.12
LV Mass 2D Index: 107.8 g/m2 (ref 49–115)
LV Mass 2D: 250.8 g — AB (ref 88–224)
LVIDd: 4.37 cm (ref 4.2–5.9)
LVIDs: 3.09 cm
LVOT Diameter: 2.1 cm
LVOT Peak Gradient: 2.2 mmHg
LVOT Peak Velocity: 73.68 cm/s
LVPWd: 1.33 cm — AB (ref 0.6–1)
Left Ventricular Ejection Fraction: 58
MV A Velocity: 61.46 cm/s
MV Area by PHT: 2.1 cm2
MV E Velocity: 55.56 cm/s
MV E Wave Deceleration Time: 365.5 ms
MV E/A: 0.9
MV PHT: 106 ms
Mitral Valve E-F Slope by M-mode: 1.5201
RVIDd: 3.45 cm

## 2019-04-08 LAB — EKG 12-LEAD
Atrial Rate: 85 {beats}/min
Atrial Rate: 85 {beats}/min
Diagnosis: NORMAL
Diagnosis: NORMAL
P Axis: 54 degrees
P Axis: 63 degrees
P-R Interval: 194 ms
P-R Interval: 190 ms
Q-T Interval: 378 ms
Q-T Interval: 362 ms
QRS Duration: 122 ms
QRS Duration: 116 ms
QTc Calculation (Bazett): 449 ms
QTc Calculation (Bazett): 430 ms
R Axis: -11 degrees
R Axis: -28 degrees
T Axis: 19 degrees
T Axis: -2 degrees
Ventricular Rate: 85 {beats}/min
Ventricular Rate: 85 {beats}/min

## 2019-04-08 LAB — COVID-19: SARS-CoV-2: NOT DETECTED

## 2019-04-08 LAB — APTT: aPTT: 26.8 s (ref 22.1–32.0)

## 2019-04-08 LAB — POCT GLUCOSE
POC Glucose: 218 mg/dL — ABNORMAL HIGH (ref 65–100)
POC Glucose: 168 mg/dL — ABNORMAL HIGH (ref 65–100)
POC Glucose: 320 mg/dL — ABNORMAL HIGH (ref 65–100)
POC Glucose: 212 mg/dL — ABNORMAL HIGH (ref 65–100)

## 2019-04-08 LAB — URINALYSIS W/ RFLX MICROSCOPIC
BACTERIA, URINE: NEGATIVE /hpf
Bacteria: NEGATIVE /hpf
Bilirubin, Urine: NEGATIVE
Bilirubin: NEGATIVE
Glucose, Ur: NEGATIVE mg/dL
Glucose: NEGATIVE mg/dL
Ketone: NEGATIVE mg/dL
Ketones, Urine: NEGATIVE mg/dL
Leukocyte Esterase, Urine: NEGATIVE
Leukocyte Esterase: NEGATIVE
Nitrite, Urine: NEGATIVE
Nitrites: NEGATIVE
Protein, UA: 100 mg/dL — AB
Protein: 100 mg/dL — AB
Specific Gravity, UA: 1.019 (ref 1.003–1.030)
Specific gravity: 1.019 (ref 1.003–1.030)
Urobilinogen, UA, POCT: 0.2 EU/dL (ref 0.2–1.0)
Urobilinogen: 0.2 EU/dL (ref 0.2–1.0)
pH (UA): 5 (ref 5.0–8.0)
pH, UA: 5 (ref 5.0–8.0)

## 2019-04-08 LAB — D-DIMER, QUANTITATIVE: D-Dimer, Quant: 1.47 mg/L FEU — ABNORMAL HIGH (ref 0.00–0.65)

## 2019-04-08 LAB — FIBRINOGEN
Fibrinogen: 508 mg/dL — ABNORMAL HIGH (ref 200–475)
Fibrinogen: 508 mg/dL — ABNORMAL HIGH (ref 200–475)

## 2019-04-08 LAB — PROTIME-INR
INR: 1 (ref 0.9–1.1)
Protime: 10.4 s (ref 9.0–11.1)

## 2019-04-08 LAB — TROPONIN
Troponin I: <0.05 ng/mL (ref <0.05)
Troponin I: <0.05 ng/mL (ref <0.05)

## 2019-04-08 LAB — CBC WITH AUTOMATED DIFF
ABS. BASOPHILS: 0.1 10*3/uL (ref 0.0–0.1)
ABS. EOSINOPHILS: 0.2 10*3/uL (ref 0.0–0.4)
ABS. IMM. GRANS.: 0.1 10*3/uL — ABNORMAL HIGH (ref 0.00–0.04)
ABS. LYMPHOCYTES: 2.8 10*3/uL (ref 0.8–3.5)
ABS. MONOCYTES: 0.8 10*3/uL (ref 0.0–1.0)
ABS. NEUTROPHILS: 7 10*3/uL (ref 1.8–8.0)
ABSOLUTE NRBC: 0 10*3/uL (ref 0.00–0.01)
BASOPHILS: 1 % (ref 0–1)
EOSINOPHILS: 2 % (ref 0–7)
HCT: 42.5 % (ref 36.6–50.3)
HGB: 13.8 g/dL (ref 12.1–17.0)
IMMATURE GRANULOCYTES: 1 % — ABNORMAL HIGH (ref 0.0–0.5)
LYMPHOCYTES: 26 % (ref 12–49)
MCH: 28.9 PG (ref 26.0–34.0)
MCHC: 32.5 g/dL (ref 30.0–36.5)
MCV: 89.1 FL (ref 80.0–99.0)
MONOCYTES: 7 % (ref 5–13)
MPV: 12.7 FL (ref 8.9–12.9)
NEUTROPHILS: 63 % (ref 32–75)
NRBC: 0 PER 100 WBC
PLATELET: 277 10*3/uL (ref 150–400)
RBC: 4.77 M/uL (ref 4.10–5.70)
RDW: 14.4 % (ref 11.5–14.5)
WBC: 11 10*3/uL (ref 4.1–11.1)

## 2019-04-08 LAB — METABOLIC PANEL, COMPREHENSIVE
A-G Ratio: 0.7 — ABNORMAL LOW (ref 1.1–2.2)
ALT (SGPT): 15 U/L (ref 12–78)
AST (SGOT): 18 U/L (ref 15–37)
Albumin: 2.8 g/dL — ABNORMAL LOW (ref 3.5–5.0)
Alk. phosphatase: 76 U/L (ref 45–117)
Anion gap: 9 mmol/L (ref 5–15)
BUN/Creatinine ratio: 15 (ref 12–20)
BUN: 19 MG/DL (ref 6–20)
Bilirubin, total: 0.3 MG/DL (ref 0.2–1.0)
CO2: 25 mmol/L (ref 21–32)
Calcium: 8.5 MG/DL (ref 8.5–10.1)
Chloride: 103 mmol/L (ref 97–108)
Creatinine: 1.23 MG/DL (ref 0.70–1.30)
GFR est AA: >60 mL/min/{1.73_m2} (ref >60)
GFR est non-AA: 59 mL/min/{1.73_m2} — ABNORMAL LOW (ref >60)
Globulin: 3.9 g/dL (ref 2.0–4.0)
Glucose: 193 mg/dL — ABNORMAL HIGH (ref 65–100)
Potassium: 4.4 mmol/L (ref 3.5–5.1)
Protein, total: 6.7 g/dL (ref 6.4–8.2)
Sodium: 137 mmol/L (ref 136–145)

## 2019-04-08 LAB — EKG, 12 LEAD, INITIAL
Atrial Rate: 85 {beats}/min
Atrial Rate: 85 {beats}/min
Calculated P Axis: 54 degrees
Calculated P Axis: 63 degrees
Calculated R Axis: -11 degrees
Calculated R Axis: -28 degrees
Calculated T Axis: 19 degrees
Calculated T Axis: -2 degrees
Diagnosis: NORMAL
Diagnosis: NORMAL
P-R Interval: 194 ms
P-R Interval: 190 ms
Q-T Interval: 378 ms
Q-T Interval: 362 ms
QRS Duration: 122 ms
QRS Duration: 116 ms
QTC Calculation (Bezet): 449 ms
QTC Calculation (Bezet): 430 ms
Ventricular Rate: 85 {beats}/min
Ventricular Rate: 85 {beats}/min

## 2019-04-08 LAB — GLUCOSE, POC
Glucose (POC): 218 mg/dL — ABNORMAL HIGH (ref 65–100)
Glucose (POC): 168 mg/dL — ABNORMAL HIGH (ref 65–100)
Glucose (POC): 320 mg/dL — ABNORMAL HIGH (ref 65–100)
Glucose (POC): 212 mg/dL — ABNORMAL HIGH (ref 65–100)

## 2019-04-08 LAB — TROPONIN I
Troponin-I, Qt.: <0.05 ng/mL (ref <0.05)
Troponin-I, Qt.: <0.05 ng/mL (ref <0.05)

## 2019-04-08 LAB — ECHO ADULT COMPLETE
Aortic Root: 3.78 cm
IVC Sniffing: 1.8 cm
IVSd: 1.26 cm — AB (ref 0.6–1.0)
LA Major Axis: 4.22 cm
LA/AO Root Ratio: 1.12
LV Mass 2D Index: 107.8 g/m2 (ref 49–115)
LV Mass 2D: 250.8 g — AB (ref 88–224)
LVIDd: 4.37 cm (ref 4.2–5.9)
LVIDs: 3.09 cm
LVOT Diameter: 2.1 cm
LVOT Peak Gradient: 2.2 mmHg
LVOT Peak Velocity: 73.68 cm/s
LVPWd: 1.33 cm — AB (ref 0.6–1.0)
Left Ventricular Fractional Shortening by 2D: 29.232 %
MV A Velocity: 61.46 cm/s
MV Area by PHT: 2.1 cm2
MV E Velocity: 55.56 cm/s
MV E Wave Deceleration Time: 365.5 ms
MV E/A: 0.9
MV PHT: 106 ms
Mitral Valve Deceleration Slope: 1.5201
RVIDd: 3.45 cm

## 2019-04-08 LAB — SARS-COV-2: SARS-CoV-2 by PCR: NOT DETECTED

## 2019-04-08 LAB — PTT: aPTT: 26.8 s (ref 22.1–32.0)

## 2019-04-08 LAB — PROTHROMBIN TIME + INR
INR: 1 (ref 0.9–1.1)
Prothrombin time: 10.4 s (ref 9.0–11.1)

## 2019-04-08 LAB — D DIMER: D-dimer: 1.47 mg/L FEU — ABNORMAL HIGH (ref 0.00–0.65)

## 2019-04-08 MED ORDER — SODIUM CHLORIDE 0.9 % IJ SYRG
INTRAMUSCULAR | Status: DC | PRN
Start: 2019-04-08 — End: 2019-04-18
  Administered 2019-04-18: 13:00:00 via INTRAVENOUS

## 2019-04-08 MED ORDER — HEPARIN (PORCINE) 1,000 UNIT/ML IJ SOLN
1000 unit/mL | INTRAMUSCULAR | Status: DC | PRN
Start: 2019-04-08 — End: 2019-04-08
  Administered 2019-04-08 (×2)

## 2019-04-08 MED ORDER — MORPHINE 2 MG/ML INJECTION
2 mg/mL | Freq: Four times a day (QID) | INTRAMUSCULAR | Status: DC | PRN
Start: 2019-04-08 — End: 2019-04-15
  Administered 2019-04-08 – 2019-04-15 (×22): via INTRAVENOUS

## 2019-04-08 MED ORDER — FAMOTIDINE 20 MG TAB
20 mg | Freq: Two times a day (BID) | ORAL | Status: DC
Start: 2019-04-08 — End: 2019-04-18
  Administered 2019-04-08 – 2019-04-18 (×20): via ORAL

## 2019-04-08 MED ORDER — FENTANYL CITRATE (PF) 50 MCG/ML IJ SOLN
50 mcg/mL | INTRAMUSCULAR | Status: DC | PRN
Start: 2019-04-08 — End: 2019-04-08
  Administered 2019-04-08: 15:00:00 via INTRAVENOUS

## 2019-04-08 MED ORDER — SODIUM CHLORIDE 0.9 % IJ SYRG
INTRAMUSCULAR | Status: DC | PRN
Start: 2019-04-08 — End: 2019-04-08
  Administered 2019-04-08: 15:00:00 via INTRAVENOUS

## 2019-04-08 MED ORDER — HYDROXYZINE 25 MG TAB
25 mg | Freq: Three times a day (TID) | ORAL | Status: DC | PRN
Start: 2019-04-08 — End: 2019-04-18
  Administered 2019-04-09 (×2): via ORAL

## 2019-04-08 MED ORDER — INSULIN NPH HUMAN RECOMB 100 UNIT/ML INJECTION
100 unit/mL | Freq: Two times a day (BID) | SUBCUTANEOUS | Status: DC
Start: 2019-04-08 — End: 2019-04-09

## 2019-04-08 MED ORDER — HYDROCORTISONE SOD SUCCINATE (PF) 100 MG/2 ML SOLUTION FOR INJECTION
100 mg/2 mL | INTRAMUSCULAR | Status: AC
Start: 2019-04-08 — End: ?

## 2019-04-08 MED ORDER — DIPHENHYDRAMINE HCL 50 MG/ML IJ SOLN
50 mg/mL | INTRAMUSCULAR | Status: DC | PRN
Start: 2019-04-08 — End: 2019-04-08
  Administered 2019-04-08: 15:00:00 via INTRAVENOUS

## 2019-04-08 MED ORDER — HEPARIN (PORCINE) IN NS (PF) 1,000 UNIT/500 ML IV
1000 unit/500 mL | INTRAVENOUS | Status: AC | PRN
Start: 2019-04-08 — End: 2019-04-08
  Administered 2019-04-08: 15:00:00

## 2019-04-08 MED ORDER — ATROPINE 0.1 MG/ML SYRINGE
0.1 mg/mL | INTRAMUSCULAR | Status: AC
Start: 2019-04-08 — End: ?

## 2019-04-08 MED ORDER — IOPAMIDOL 76 % IV SOLN
76 % | INTRAVENOUS | Status: DC | PRN
Start: 2019-04-08 — End: 2019-04-08
  Administered 2019-04-08: 15:00:00

## 2019-04-08 MED ORDER — SODIUM CHLORIDE 0.9 % IJ SYRG
Freq: Three times a day (TID) | INTRAMUSCULAR | Status: DC
Start: 2019-04-08 — End: 2019-04-18
  Administered 2019-04-08 – 2019-04-18 (×30): via INTRAVENOUS

## 2019-04-08 MED ORDER — NITROGLYCERIN 0.1 MG/ML (100 MCG/ML) D5W COMPOUNDED INJECTION
0.1 mg/mL | INTRAVENOUS | Status: AC
Start: 2019-04-08 — End: ?

## 2019-04-08 MED ORDER — HEPARIN (PORCINE) IN NS (PF) 1,000 UNIT/500 ML IV
1000 unit/500 mL | INTRAVENOUS | Status: AC
Start: 2019-04-08 — End: ?

## 2019-04-08 MED ORDER — PERFLUTREN LIPID MICROSPHERES 1.1 MG/ML IV
1.1 mg/mL | Freq: Once | INTRAVENOUS | Status: AC
Start: 2019-04-08 — End: 2019-04-08
  Administered 2019-04-08: 14:00:00 via INTRAVENOUS

## 2019-04-08 MED ORDER — SODIUM CHLORIDE 0.9 % IV
INTRAVENOUS | Status: DC
Start: 2019-04-08 — End: 2019-04-09
  Administered 2019-04-08: 16:00:00 via INTRAVENOUS

## 2019-04-08 MED ORDER — SODIUM CHLORIDE 0.9 % IJ SYRG
INTRAMUSCULAR | Status: AC
Start: 2019-04-08 — End: ?

## 2019-04-08 MED ORDER — IOPAMIDOL 76 % IV SOLN
76 % | INTRAVENOUS | Status: AC
Start: 2019-04-08 — End: ?

## 2019-04-08 MED ORDER — HEPARIN (PORCINE) 1,000 UNIT/ML IJ SOLN
1000 unit/mL | INTRAMUSCULAR | Status: AC
Start: 2019-04-08 — End: ?

## 2019-04-08 MED ORDER — INSULIN LISPRO 100 UNIT/ML INJECTION
100 unit/mL | SUBCUTANEOUS | Status: AC
Start: 2019-04-08 — End: ?

## 2019-04-08 MED ORDER — HYDROCORTISONE SOD SUCCINATE (PF) 100 MG/2 ML SOLUTION FOR INJECTION
100 mg/2 mL | INTRAMUSCULAR | Status: DC | PRN
Start: 2019-04-08 — End: 2019-04-08
  Administered 2019-04-08: 15:00:00 via INTRAVENOUS

## 2019-04-08 MED ORDER — SODIUM CHLORIDE 0.9 % IV
INTRAVENOUS | Status: DC
Start: 2019-04-08 — End: 2019-04-09
  Administered 2019-04-08: 14:00:00 via INTRAVENOUS

## 2019-04-08 MED ORDER — LIDOCAINE HCL 1 % (10 MG/ML) IJ SOLN
10 mg/mL (1 %) | INTRAMUSCULAR | Status: DC | PRN
Start: 2019-04-08 — End: 2019-04-08
  Administered 2019-04-08: 15:00:00 via INTRADERMAL

## 2019-04-08 MED ORDER — NITROGLYCERIN 0.1 MG/ML (100 MCG/ML) D5W COMPOUNDED INJECTION
0.1 mg/mL | INTRAVENOUS | Status: DC | PRN
Start: 2019-04-08 — End: 2019-04-08
  Administered 2019-04-08: 15:00:00 via INTRA_ARTERIAL

## 2019-04-08 MED ORDER — LORAZEPAM 2 MG/ML IJ SOLN
2 mg/mL | INTRAMUSCULAR | Status: AC
Start: 2019-04-08 — End: ?

## 2019-04-08 MED ORDER — DIPHENHYDRAMINE HCL 50 MG/ML IJ SOLN
50 mg/mL | INTRAMUSCULAR | Status: AC
Start: 2019-04-08 — End: ?

## 2019-04-08 MED ORDER — LIDOCAINE HCL 1 % (10 MG/ML) IJ SOLN
10 mg/mL (1 %) | INTRAMUSCULAR | Status: AC
Start: 2019-04-08 — End: ?

## 2019-04-08 MED ORDER — FENTANYL CITRATE (PF) 50 MCG/ML IJ SOLN
50 mcg/mL | INTRAMUSCULAR | Status: AC
Start: 2019-04-08 — End: ?

## 2019-04-08 MED FILL — HYDRALAZINE 20 MG/ML IJ SOLN: 20 mg/mL | INTRAMUSCULAR | Qty: 1

## 2019-04-08 MED FILL — HEPARIN (PORCINE) 5,000 UNIT/ML IJ SOLN: 5000 unit/mL | INTRAMUSCULAR | Qty: 1

## 2019-04-08 MED FILL — NORMAL SALINE FLUSH 0.9 % INJECTION SYRINGE: INTRAMUSCULAR | Qty: 10

## 2019-04-08 MED FILL — SODIUM CHLORIDE 0.9 % IV: INTRAVENOUS | Qty: 1000

## 2019-04-08 MED FILL — DEFINITY 1.1 MG/ML INTRAVENOUS SUSPENSION: 1.1 mg/mL | INTRAVENOUS | Qty: 2

## 2019-04-08 MED FILL — SOLU-CORTEF ACT-O-VIAL (PF) 100 MG/2 ML SOLUTION FOR INJECTION: 100 mg/2 mL | INTRAMUSCULAR | Qty: 2

## 2019-04-08 MED FILL — MORPHINE 2 MG/ML INJECTION: 2 mg/mL | INTRAMUSCULAR | Qty: 1

## 2019-04-08 MED FILL — HEPARIN (PORCINE) IN NS (PF) 1,000 UNIT/500 ML IV: 1000 unit/500 mL | INTRAVENOUS | Qty: 1000

## 2019-04-08 MED FILL — FAMOTIDINE 20 MG TAB: 20 mg | ORAL | Qty: 1

## 2019-04-08 MED FILL — LORAZEPAM 2 MG/ML IJ SOLN: 2 mg/mL | INTRAMUSCULAR | Qty: 1

## 2019-04-08 MED FILL — NORMAL SALINE FLUSH 0.9 % INJECTION SYRINGE: INTRAMUSCULAR | Qty: 20

## 2019-04-08 MED FILL — ISOVUE-370  76 % INTRAVENOUS SOLUTION: 370 mg iodine /mL (76 %) | INTRAVENOUS | Qty: 50

## 2019-04-08 MED FILL — NITROGLYCERIN 0.1 MG/ML (100 MCG/ML) D5W COMPOUNDED INJECTION: 0.1 mg/mL | INTRAVENOUS | Qty: 10

## 2019-04-08 MED FILL — HEPARIN (PORCINE) 1,000 UNIT/ML IJ SOLN: 1000 unit/mL | INTRAMUSCULAR | Qty: 10

## 2019-04-08 MED FILL — DIPHENHYDRAMINE HCL 50 MG/ML IJ SOLN: 50 mg/mL | INTRAMUSCULAR | Qty: 1

## 2019-04-08 MED FILL — SODIUM CHLORIDE 0.9 % IV: INTRAVENOUS | Qty: 500

## 2019-04-08 MED FILL — INSULIN LISPRO 100 UNIT/ML INJECTION: 100 unit/mL | SUBCUTANEOUS | Qty: 1

## 2019-04-08 MED FILL — FENTANYL CITRATE (PF) 50 MCG/ML IJ SOLN: 50 mcg/mL | INTRAMUSCULAR | Qty: 2

## 2019-04-08 MED FILL — LIDOCAINE HCL 1 % (10 MG/ML) IJ SOLN: 10 mg/mL (1 %) | INTRAMUSCULAR | Qty: 20

## 2019-04-08 MED FILL — ATROPINE 0.1 MG/ML SYRINGE: 0.1 mg/mL | INTRAMUSCULAR | Qty: 10

## 2019-04-08 MED FILL — HYDROXYZINE 25 MG TAB: 25 mg | ORAL | Qty: 1

## 2019-04-08 MED FILL — NICOTINE 14 MG/24 HR DAILY PATCH: 14 mg/24 hr | TRANSDERMAL | Qty: 1

## 2019-04-08 MED FILL — ATORVASTATIN 10 MG TAB: 10 mg | ORAL | Qty: 1

## 2019-04-08 MED FILL — CHILDREN'S ASPIRIN 81 MG CHEWABLE TABLET: 81 mg | ORAL | Qty: 1

## 2019-04-08 NOTE — Progress Notes (Signed)
Renal Dosing/Monitoring  Medication: Famotidine   Current regimen:  20 mg PO every 24 hr  Recent Labs     04/08/19  0029 04/07/19  1112   CREA 1.23 1.09   BUN 19 15     Estimated CrCl:  ~75 ml/min  Plan: Change to 20 mg PO every 12 hours per [facility] P&T Committee Protocol with respect to renal function.  Pharmacy will continue to monitor patient daily and will make dosage adjustments based upon changing renal function.    Sara Torborg, PharmD, BCPS

## 2019-04-08 NOTE — Progress Notes (Signed)
TRANSFER - IN REPORT:    Verbal report received from Lorianne RN on Derek Mclaughlin  being received from procedure area for routine progression of care. Report consisted of patient???s Situation, Background, Assessment and Recommendations(SBAR). Information from the following report(s) SBAR, Procedure Summary, MAR, Recent Results and Cardiac Rhythm NSR was reviewed with the receiving clinician. Opportunity for questions and clarification was provided. Assessment completed upon patient???s arrival to Cardiac Cath Lab RECOVERY AREA and care assumed.       Cardiac Cath Lab Recovery Arrival Note:    Derek Mclaughlin arrived to CCL recovery area.  Patient procedure= LHC. Patient on cardiac monitor, non-invasive blood pressure, SPO2 monitor. On Room Air   IV  of NS on pump at 75 ml/hr. Patient status doing well without problems. Patient is A&Ox 4. Patient reports No Pain.    PROCEDURE SITE CHECK:    Procedure site:without any bleeding and No Hematoma, No pain/discomfort reported at procedure site.     No change in patient status. Continue to monitor patient and status.

## 2019-04-08 NOTE — Progress Notes (Signed)
Hospitalist Progress Note      Hospital summary: Derek Mclaughlin is a 69 y.o. male who presents with PMH of DM, [name], HTN, [name], COPD here with c/o chest pain x1 week and worsening since last night with associated N/V and also having shortness of breath and cough.??He describes pain as tight heaviness to left side of chest and radiates up to neck and jaw. ??He is visiting from [place] and states he was supposed to living with friends when he got here but the house was vacant when he arrived. Has lived in [place] until he ran out of money now homeless. He has been out of medications for about 1 week prior to coming to ED. He states he has a history of alcohol dependence and restarted drinking alcohol about 2 weeks prior to coming to ED and drinks 12 beers/day. He states he has a hx of bipolar and schizophrenia and has had an increase in symptoms of depression over last few months, he stated he doesn't care if he lives but denies suicidal and homicidal ideations.He states he is having auditory and visual hallucinations x2 weeks where he hears his deceased wifes voice and thinks he has seen her in a room with him.   Work up in ED: negative CXR, EKG, cardiology consult, ECHO ordered, labs reviewed, psych consult, r/o COVID. 04/07/2019      Assessment/Plan:  Atypical Chest Pain:   - likely due to anxiety  -WGN:FAOZHY sinus rhythm, Inferior infarct, age undetermined, RBBB  -troponin negative x 3 negative  -ECHO: EF 55 - 60%. No regional wall motion abnormality noted. Mild (grade 1) left ventricular diastolic dysfunction.  -cardiology on board  - cardiac cath 6/9: Normal LVEDP.     [name]:   -lipid panel LDL 123  - C/w statin  ??  DM:  - pt states he takes insulin  - HGA1c 9  - started on NPH 10U bid. C/w ISS  ??  HTN: unable to recall home meds  -monitor BP  -prn hydralazine  ??  COPD: current cough and SOB  -monitor O2 sats goal for sats >92%  -CXR negative  -guaifenesin as needed for cough   -albuterol inhaler as needed, only home med- consider maintenance meds on discharge  - COVID 19 negative ??    Severe anxiety??  C/w Hallucinations: hx of Bipolar and schizophrenia.   - Auditory and visual hallucinations of deceased wife.   -denies HI and SI  -avoid narcotics. States he takes xanax 38m tid - which I explained not indicated now,   -will wait for psych eval   - hydroxyzine prn      ETOH Dependence:   - drinks 12 beers/day.  -CIWA protocol has been 0  -no s/s withdrawal  ??  Tobacco Dependence: current smoker 2ppd-1/2ppd.  -not interested in quitting, counseled for cessation  -nicotine patch  ??  Homelessness  -case management consult for resources  ??  Code status: Full  DVT prophylaxis: heparin   Disposition: TBD  ----------------------------------------------    CC: Chest pain    S: Patient is seen and examined at bedside. He had cardiac cath this AM, which was normal.   Patient has multiple complaints - abd pain - vague - abd u[place]oreaordered. Leg swelling, hx DVts - venous duplex negative now. Severe anxiety - requesting xanax which I refused.     Review of Systems:  As above     O:  Visit Vitals  BP 148/85 (BP 1 Location: Left arm,  BP Patient Position: At rest)   Pulse 89   Temp 98.1 ??F (36.7 ??C)   Resp 20   Ht _0  (1.778 m)   Wt 117.2 kg (258 lb 6.1 oz)   SpO2 93%   BMI 37.07 kg/m??       PHYSICAL EXAM:  Gen: NAD, obese   HEENT: anicteric sclerae, normal conjunctiva, oropharynx clear, MM moist  Neck: supple, trachea midline, no adenopathy  Heart: RRR, no MRG, no JVD, no peripheral edema  Lungs: CTA b/l, non-labored respirations  Abd: soft, NT, ND, BS+, no organomegaly  Extr: warm  Skin: dry, no rash  Neuro: CN II-XII grossly intact, normal speech, moves all extremities  Psych:  Anxious       Intake/Output Summary (Last 24 hours) at 04/08/2019 1753  Last data filed at 04/08/2019 1234  Gross per 24 hour   Intake 120 ml   Output 200 ml   Net -80 ml        Recent labs & imaging reviewed:   Recent Results (from the past 24 hour(s))   GLUCOSE, POC    Collection Time: 04/07/19 10:02 PM   Result Value Ref Range    Glucose (POC) 218 (H) 65 - 100 mg/dL    Performed by Doran Clay    METABOLIC PANEL, COMPREHENSIVE    Collection Time: 04/08/19 12:29 AM   Result Value Ref Range    Sodium 137 136 - 145 mmol/L    Potassium 4.4 3.5 - 5.1 mmol/L    Chloride 103 97 - 108 mmol/L    CO2 25 21 - 32 mmol/L    Anion gap 9 5 - 15 mmol/L    Glucose 193 (H) 65 - 100 mg/dL    BUN 19 6 - 20 MG/DL    Creatinine 1.23 0.70 - 1.30 MG/DL    BUN/Creatinine ratio 15 12 - 20      GFR est AA >60 >60 ml/min/1.36m    GFR est non-AA 59 (L) >60 ml/min/1.719m   Calcium 8.5 8.5 - 10.1 MG/DL    Bilirubin, total 0.3 0.2 - 1.0 MG/DL    ALT (SGPT) 15 12 - 78 U/L    AST (SGOT) 18 15 - 37 U/L    Alk. phosphatase 76 45 - 117 U/L    Protein, total 6.7 6.4 - 8.2 g/dL    Albumin 2.8 (L) 3.5 - 5.0 g/dL    Globulin 3.9 2.0 - 4.0 g/dL    A-G Ratio 0.7 (L) 1.1 - 2.2     CBC WITH AUTOMATED DIFF    Collection Time: 04/08/19 12:29 AM   Result Value Ref Range    WBC 11.0 4.1 - 11.1 K/uL    RBC 4.77 4.10 - 5.70 M/uL    HGB 13.8 12.1 - 17.0 g/dL    HCT 42.5 36.6 - 50.3 %    MCV 89.1 80.0 - 99.0 FL    MCH 28.9 26.0 - 34.0 PG    MCHC 32.5 30.0 - 36.5 g/dL    RDW 14.4 11.5 - 14.5 %    PLATELET 277 150 - 400 K/uL    MPV 12.7 8.9 - 12.9 FL    NRBC 0.0 0 PER 100 WBC    ABSOLUTE NRBC 0.00 0.00 - 0.01 K/uL    NEUTROPHILS 63 32 - 75 %    LYMPHOCYTES 26 12 - 49 %    MONOCYTES 7 5 - 13 %    EOSINOPHILS 2 0 - 7 %  BASOPHILS 1 0 - 1 %    IMMATURE GRANULOCYTES 1 (H) 0.0 - 0.5 %    ABS. NEUTROPHILS 7.0 1.8 - 8.0 K/UL    ABS. LYMPHOCYTES 2.8 0.8 - 3.5 K/UL    ABS. MONOCYTES 0.8 0.0 - 1.0 K/UL    ABS. EOSINOPHILS 0.2 0.0 - 0.4 K/UL    ABS. BASOPHILS 0.1 0.0 - 0.1 K/UL    ABS. IMM. GRANS. 0.1 (H) 0.00 - 0.04 K/UL    DF AUTOMATED     PROTHROMBIN TIME + INR    Collection Time: 04/08/19 12:29 AM   Result Value Ref Range    INR 1.0 0.9 - 1.1       Prothrombin time 10.4 9.0 - 11.1 sec   PTT    Collection Time: 04/08/19 12:29 AM   Result Value Ref Range    aPTT 26.8 22.1 - 32.0 sec    aPTT, therapeutic range     58.0 - 77.0 SECS   FIBRINOGEN    Collection Time: 04/08/19 12:29 AM   Result Value Ref Range    Fibrinogen 508 (H) 200 - 475 mg/dL   D DIMER    Collection Time: 04/08/19 12:29 AM   Result Value Ref Range    D-dimer 1.47 (H) 0.00 - 0.65 mg/L FEU   LIPID PANEL    Collection Time: 04/08/19 12:29 AM   Result Value Ref Range    LIPID PROFILE          Cholesterol, total 201 (H) <200 MG/DL    Triglyceride 209 (H) <150 MG/DL    HDL Cholesterol 36 MG/DL    LDL, calculated 123.2 (H) 0 - 100 MG/DL    VLDL, calculated 41.8 MG/DL    CHOL/HDL Ratio 5.6 (H) 0.0 - 5.0     EKG, 12 LEAD, INITIAL    Collection Time: 04/08/19  1:18 AM   Result Value Ref Range    Ventricular Rate 85 BPM    Atrial Rate 85 BPM    P-R Interval 190 ms    QRS Duration 116 ms    Q-T Interval 362 ms    QTC Calculation (Bezet) 430 ms    Calculated P Axis 63 degrees    Calculated R Axis -28 degrees    Calculated T Axis -2 degrees    Diagnosis       Normal sinus rhythm  Right bundle branch block    When compared with ECG of 07-Apr-2019 11:11,  No significant change  Confirmed by Cammy Copa MD. ([id number]) on 04/08/2019 4:20:38 PM     CULTURE, RESPIRATORY/SPUTUM/BRONCH W Lonell Grandchild STAIN    Collection Time: 04/08/19  1:33 AM   Result Value Ref Range    Special Requests: NO SPECIAL REQUESTS      GRAM STAIN OCCASIONAL WBCS SEEN      GRAM STAIN 3+ GRAM POSITIVE COCCI      GRAM STAIN FEW GRAM NEGATIVE RODS      Culture result: PENDING    TROPONIN I    Collection Time: 04/08/19  2:02 AM   Result Value Ref Range    Troponin-I, Qt. <0.05 <0.05 ng/mL   URINALYSIS W/ RFLX MICROSCOPIC    Collection Time: 04/08/19  2:07 AM   Result Value Ref Range    Color YELLOW/STRAW      Appearance CLEAR CLEAR      Specific gravity 1.019 1.003 - 1.030      pH (UA) 5.0 5.0 - 8.0      Protein 100 (  A) NEG mg/dL     Glucose Negative NEG mg/dL    Ketone Negative NEG mg/dL    Bilirubin Negative NEG      Blood LARGE (A) NEG      Urobilinogen 0.2 0.2 - 1.0 EU/dL    Nitrites Negative NEG      Leukocyte Esterase Negative NEG      WBC 0-4 0 - 4 /hpf    RBC 50-100 0 - 5 /hpf    Epithelial cells FEW FEW /lpf    Bacteria Negative NEG /hpf    Mucus TRACE (A) NEG /lpf   GLUCOSE, POC    Collection Time: 04/08/19  8:14 AM   Result Value Ref Range    Glucose (POC) 168 (H) 65 - 100 mg/dL    Performed by Linden Dolin    ECHO ADULT COMPLETE    Collection Time: 04/08/19  9:35 AM   Result Value Ref Range    Ao Root D 3.78 cm    LVIDd 4.37 4.2 - 5.9 cm    LVPWd 1.33 (A) 0.6 - 1.0 cm    LVIDs 3.09 cm    IVSd 1.26 (A) 0.6 - 1.0 cm    LVOT d 2.10 cm    LVOT Peak Velocity 73.68 cm/s    LVOT Peak Gradient 2.2 mmHg    MVA (PHT) 2.1 cm2    MV A Vel 61.46 cm/s    MV E Vel 55.56 cm/s    MV E/A 0.90     Left Atrium to Aortic Root Ratio 1.12     RVIDd 3.45 cm    Inferior Vena Cava Diameter Sniffing 1.80 cm    LV Mass AL 250.8 (A) 88 - 224 g    LV Mass AL Index 107.8 49 - 115 g/m2    Mitral Valve E Wave Deceleration Time 365.5 ms    Mitral Valve Pressure Half-time 106.0 ms    Left Atrium Major Axis 4.22 cm    Left Ventricular Fractional Shortening by 2D 54.627035009 %    Mitral Valve Deceleration Slope [id number]    GLUCOSE, POC    Collection Time: 04/08/19 11:40 AM   Result Value Ref Range    Glucose (POC) 320 (H) 65 - 100 mg/dL    Performed by Darcel Bayley    GLUCOSE, POC    Collection Time: 04/08/19  5:30 PM   Result Value Ref Range    Glucose (POC) 212 (H) 65 - 100 mg/dL    Performed by Gerald Leitz K      Recent Labs     04/08/19  0029 04/07/19  1112   WBC 11.0 11.5*   HGB 13.8 14.6   HCT 42.5 44.6   PLT 277 228     Recent Labs     04/08/19  0029 04/07/19  1112   NA 137 137   K 4.4 3.8   CL 103 105   CO2 25 25   BUN 19 15   CREA 1.23 1.09   GLU 193* 225*   CA 8.5 8.6   MG  --  1.7     Recent Labs     04/08/19  0029 04/07/19  1112    ALT 15 14   AP 76 74   TBILI 0.3 0.5   TP 6.7 6.7   ALB 2.8* 2.9*   GLOB 3.9 3.8   LPSE  --  124     Recent Labs     04/08/19  0029  04/07/19  1649 04/07/19  1112   INR 1.0 1.0 0.9   PTP 10.4 10.3 9.8   APTT 26.8 26.8  --       Recent Labs     04/07/19  1649   FERR 131      No results found for: FOL, RBCF   No results for input(s): PH, PCO2, PO2 in the last 72 hours.  Recent Labs     04/08/19  0202 04/07/19  1649 04/07/19  1112   TROIQ <0.05 <0.05 <0.05     Lab Results   Component Value Date/Time    Cholesterol, total 201 (H) 04/08/2019 12:29 AM    HDL Cholesterol 36 04/08/2019 12:29 AM    LDL, calculated 123.2 (H) 04/08/2019 12:29 AM    Triglyceride 209 (H) 04/08/2019 12:29 AM    CHOL/HDL Ratio 5.6 (H) 04/08/2019 12:29 AM     Lab Results   Component Value Date/Time    Glucose (POC) 212 (H) 04/08/2019 05:30 PM    Glucose (POC) 320 (H) 04/08/2019 11:40 AM    Glucose (POC) 168 (H) 04/08/2019 08:14 AM    Glucose (POC) 218 (H) 04/07/2019 10:02 PM    Glucose (POC) 156 (H) 04/07/2019 05:11 PM     Lab Results   Component Value Date/Time    Color YELLOW/STRAW 04/08/2019 02:07 AM    Appearance CLEAR 04/08/2019 02:07 AM    Specific gravity 1.019 04/08/2019 02:07 AM    pH (UA) 5.0 04/08/2019 02:07 AM    Protein 100 (A) 04/08/2019 02:07 AM    Glucose Negative 04/08/2019 02:07 AM    Ketone Negative 04/08/2019 02:07 AM    Bilirubin Negative 04/08/2019 02:07 AM    Urobilinogen 0.2 04/08/2019 02:07 AM    Nitrites Negative 04/08/2019 02:07 AM    Leukocyte Esterase Negative 04/08/2019 02:07 AM    Epithelial cells FEW 04/08/2019 02:07 AM    Bacteria Negative 04/08/2019 02:07 AM    WBC 0-4 04/08/2019 02:07 AM    RBC 50-100 04/08/2019 02:07 AM       Med list reviewed  Current Facility-Administered Medications   Medication Dose Route Frequency   ??? 0.9% sodium chloride infusion  75 mL/hr IntraVENous CONTINUOUS   ??? 0.9% sodium chloride infusion 500 mL  500 mL IntraVENous CONTINUOUS    ??? sodium chloride (NS) flush 5-40 mL  5-40 mL IntraVENous Q8H   ??? sodium chloride (NS) flush 5-40 mL  5-40 mL IntraVENous PRN   ??? famotidine (PEPCID) tablet 20 mg  20 mg Oral BID   ??? morphine injection 0.5 mg  0.5 mg IntraVENous Q6H PRN   ??? insulin NPH (NOVOLIN N, HUMULIN N) injection 10 Units  10 Units SubCUTAneous ACB/HS   ??? sodium chloride (NS) flush 5-40 mL  5-40 mL IntraVENous Q8H   ??? sodium chloride (NS) flush 5-40 mL  5-40 mL IntraVENous PRN   ??? heparin (porcine) injection 5,000 Units  5,000 Units SubCUTAneous Q8H   ??? 0.9% sodium chloride 0,093 mL with folic acid 1 mg, thiamine 100 mg, mvi, adult no.4 with vit K 10 mL infusion   IntraVENous DAILY   ??? prochlorperazine (COMPAZINE) tablet 10 mg  10 mg Oral Q6H PRN   ??? glucose chewable tablet 16 g  4 Tab Oral PRN   ??? glucagon (GLUCAGEN) injection 1 mg  1 mg IntraMUSCular PRN   ??? insulin lispro (HUMALOG) injection   SubCUTAneous AC&HS   ??? atorvastatin (LIPITOR) tablet 10 mg  10 mg Oral QHS   ???  acetaminophen (TYLENOL) tablet 650 mg  650 mg Oral Q4H PRN   ??? hydrALAZINE (APRESOLINE) 20 mg/mL injection 20 mg  20 mg IntraVENous Q6H PRN   ??? guaiFENesin (ROBITUSSIN) 100 mg/5 mL oral liquid 100 mg  100 mg Oral Q4H PRN   ??? ondansetron (ZOFRAN) injection 4 mg  4 mg IntraVENous Q4H PRN   ??? nicotine (NICODERM CQ) 14 mg/24 hr patch 1 Patch  1 Patch TransDERmal DAILY   ??? albuterol (PROVENTIL HFA, VENTOLIN HFA, PROAIR HFA) inhaler 2 Puff  2 Puff Inhalation Q4H PRN   ??? aspirin chewable tablet 81 mg  81 mg Oral DAILY       Care Plan discussed with:  Patient/Family and Nurse    Cheryll Cockayne, MD  Internal Medicine  Date of Service: 04/08/2019

## 2019-04-08 NOTE — Progress Notes (Signed)
0038 - complained of chest pressure - rated 8 on pain scale.  12 lead ekg obtained and reviewed by CCU RR nurse - no changes.  Michael Lucrezio NP notified  and a repeat Troponin was obtained. Morphine was given which relieved patient's pain.     0400  - Resting quietly.  No further complaints of chest pain.

## 2019-04-08 NOTE — Progress Notes (Signed)
Bedside and Verbal shift change report given to Hannah T (oncoming nurse) by Mo (offgoing nurse). Report included the following information SBAR, Kardex, Intake/Output, MAR and Cardiac Rhythm NSR.

## 2019-04-08 NOTE — Other (Signed)
[place]  CLINICAL NURSE SPECIALIST CONSULT  [facility]    INITIAL NOTE    Presentation   Derek Mclaughlin is a 68 y.o. male admitted with chest pain for 1 week accompanied by N/V and SOB with cough . Current clinical course has been complicated by recent bout of increased depression without SI/ or homicidal along  with hallucinations. He is visiting here from [place] with the intention of moving in with 'friends' but when he arrived the 'home' was vacant.  He lived in a hotel until money ran out, and now is homeless. He also has ran out of his medications.      PMH: DM2/ [name]/ HTN/ [name]/ COPD/ ETOH abuse (12 beers daily)/ + cigarette smoker/bipoloar and schizophrenia.    Diabetes: Patient has known uncontrolled  Type 2 diabetes- A1C 9.1% , treated with Metformin and Lispro insulin  PTA. Family history unknown for diabetes. Consulted by Provider for advanced diabetes nursing assessment and care, specifically related to   [] Transitioning off Glucostabilizer   [x] Inpatient management strategy  [x] Home management assessment  [] Survival skill education    Diabetes-related medical history  Acute complications  hyperglycemia  Neurological complications  TBD  Microvascular disease  TBD  Macrovascular disease  [name]  Other associated conditions     Depression / bipolor/ schizophrenia/HTN/ COPD/ ETOH    Diabetes medication history  Drug class Currently in use Discontinued Never used   Biguanide Metformin 1068m daily     DDP-4 inhibitor       Sulfonylurea      Thiazolidinedione      GLP-1 RA      SGLT-2 inhibitors      Basal insulin None listed on PTA?     Fixed Dose  Combinations      Bolus insulin Lispro TID with meals;  40units with breakfast and lunch  45units with dinner       Subjective   Derek Mclaughlin currently down in the cath lab - Will revisit him this afternoon and discuss topics below related to his current diabetes management.      1430: I followed up with Derek Mclaughlin afternoon.  I was unable to get an accurate account of how he currently manages his diabetes due to his labile mood and level of anxiety.  He was very concerned about where he was going to go after the hospital.  He prefers to stay in [place] To get help with his ETOH abuse and mental health.  He verbalized he has 'played russian roulette with a gun many times'.  He would close his eyes when I was trying to speak with him but not be falling to sleep.  He verbalizes concern for his own safety if he is sent to a shelter.      Patient reports the following home diabetes self-care practices:  Eating pattern    Physical activity pattern  []  Monitoring pattern-checked it off and on- 140-390s.  Lost his glucometer, will need a new one.    Taking medications pattern  [x] In-Consistent administration??  [x] Affordable    Social determinants of health impacting diabetes self-management practices   Worried that housing situation is unstable, Worried that your food supply will run out before you have money to buy more and Struggling with anxiety and/or depression    Objective   Physical exam  General Alert, oriented and in no acute distress. Conversant but anxious and cooperative.   Vital Signs  Visit Vitals  BP 122/68   Pulse 74   Temp 98.5 ??F (36.9 ??C)   Resp 20   Ht 5' 10" (1.778 m)   Wt 117.2 kg (258 lb 6.1 oz)   SpO2 96%   BMI 37.07 kg/m??     Skin  Warm and dry. Acanthosis noted along neckline.   Heart   Regular rate and rhythm. No murmurs, rubs or gallops  Lungs  Clear to auscultation without rales or rhonchi  Extremities No foot wounds    Diabetic foot exam: Vibratory and Filament test: deferred due to clinical condition-patient off floor    Left Foot       Right Foot       Laboratory  Lab Results   Component Value Date/Time    Hemoglobin A1c 9.1 (H) 04/07/2019 04:49 PM     Lab Results   Component Value Date/Time    LDL, calculated 123.2 (H) 04/08/2019 12:29 AM     Lab Results    Component Value Date/Time    Creatinine 1.23 04/08/2019 12:29 AM     Lab Results   Component Value Date/Time    Sodium 137 04/08/2019 12:29 AM    Potassium 4.4 04/08/2019 12:29 AM    Chloride 103 04/08/2019 12:29 AM    CO2 25 04/08/2019 12:29 AM    Anion gap 9 04/08/2019 12:29 AM    Glucose 193 (H) 04/08/2019 12:29 AM    BUN 19 04/08/2019 12:29 AM    Creatinine 1.23 04/08/2019 12:29 AM    BUN/Creatinine ratio 15 04/08/2019 12:29 AM    GFR est AA >60 04/08/2019 12:29 AM    GFR est non-AA 59 (L) 04/08/2019 12:29 AM    Calcium 8.5 04/08/2019 12:29 AM    Bilirubin, total 0.3 04/08/2019 12:29 AM    Alk. phosphatase 76 04/08/2019 12:29 AM    Protein, total 6.7 04/08/2019 12:29 AM    Albumin 2.8 (L) 04/08/2019 12:29 AM    Globulin 3.9 04/08/2019 12:29 AM    A-G Ratio 0.7 (L) 04/08/2019 12:29 AM    ALT (SGPT) 15 04/08/2019 12:29 AM     Lab Results   Component Value Date/Time    ALT (SGPT) 15 04/08/2019 12:29 AM       Factors affecting BG pattern  Factor Dose Comments   Nutrition:  Carb-controlled meals     60 grams/meal      Drugs:    Other: ETOH   12 beers daily      Blood glucose pattern      Assessment and Plan   Nursing Diagnosis Risk for unstable blood glucose pattern   Nursing Intervention Domain 5250 Decision-making Support   Nursing Interventions Examined current inpatient diabetes control   Explored factors facilitating and impeding inpatient management  Identified self-management practices impeding diabetes control  Explored corrective strategies with patient and responsible inpatient provider   Informed patient of rational for insulin strategy while hospitalized     Evaluation   Derek Mclaughlin, with uncontrolledType 2 diabetes- A1C 9.1% , hasn't  achieved inpatient blood glucose target of 100-167m/dl.  Was NPO last night for cath lab today.  POC BG trends last 12 hours 156-218 mg/dl.  Currently receiving correctional insulin only.  AM BG 1669mdl.  It will  be appropriate to INITIATE insulin subcutaneous protocol  When BG trends>2002ml.   Inpatient blood glucose management has been impacted by  [] Kidney dysfunction  [x] Erratic meal consumption /NPO  [] Glucocorticoid use  [x] chest pain  Recommendations   1.  Once BG trends >242m/dl; consider INITIATING insulin subcutaneous order set ([facility]:     Basal insulin   [x] 0.2 units/kg/D=  Start 20units daily;     Daily evaluation fasting blood glucose will be required to determine the appropriate amount of Lantus. If fasting blood glucose is over 200, please add the total amount of correctional Humalog received the day before to total Lantus dose.  Ok to split Lantus BID for larger doses.    2.  ADD mealtime Bolus insulin (if BG remains >2056mdl despite basal insulin)  [x] Normal sensitivity=6 units TID with meals      3.  CONTINUE Corrective insulin  [x] Normal sensitivity      Referral   [x] Behavioral health services (consulted)  [] Inpatient nutrition services  [] Pharmacy services for medication management  [] Diabetes Self-Management Training through [facility] (Phone [phone] schedule appointment)    Billing Code(s)   Thank you for including us[place]orean their care.  I spent 60 minutes in direct patient care today for this patient.  Time includes chart review, face to face with patient and collaboration with interdisciplinary care team.      MiLily LovingsCNS  [facility]  Access via [place]) [phone]

## 2019-04-08 NOTE — Progress Notes (Signed)
TRANSFER - IN REPORT:    Verbal report received from Sarah RN(name) on Derek Mclaughlin  being received from Room 400(unit) for routine progression of care      Report consisted of patient???s Situation, Background, Assessment and   Recommendations(SBAR).     Information from the following report(s) SBAR, Procedure Summary, MAR, Recent Results and Cardiac Rhythm Sinus Bradycardia was reviewed with the receiving nurse.    Opportunity for questions and clarification was provided.      Assessment completed upon patient???s arrival to unit and care assumed.

## 2019-04-08 NOTE — Other (Signed)
Cardiac Rehab: Per EMR, patient is a current smoker. Smoking Cessation Program information placed on the AVS.    Neng Albee, RN

## 2019-04-08 NOTE — Progress Notes (Signed)
Problem: Diabetes Self-Management  Goal: *Prevention, detection, treatment of acute complications  Description: List symptoms of hyper- and hypoglycemia; describe how to treat low blood sugar and actions for lowering  high blood glucose level.  Outcome: Progressing Towards Goal     Problem: Patient Education: Go to Patient Education Activity  Goal: Patient/Family Education  Outcome: Progressing Towards Goal     Problem: Unstable angina/NSTEMI: Day of Admission/Day 1  Goal: Diagnostic Test/Procedures  Outcome: Progressing Towards Goal  Goal: Nutrition/Diet  Outcome: Progressing Towards Goal  Goal: Medications  Outcome: Progressing Towards Goal  Goal: Respiratory  Outcome: Progressing Towards Goal     Problem: Cath Lab Procedures: Post-Cath Day of Procedure (Initiate SCIP Measures for Post-Op Care)  Goal: Medications  Outcome: Progressing Towards Goal

## 2019-04-08 NOTE — Consults (Signed)
Cardiovascular Consult    Patient: Derek PummelRobert Urbas MRN: [id number]  SSN: [id number]    Date of Birth: 16-Jan-1951  Age: 68 y.o.  Sex: male       Subjective:      Date of  Admission: 04/07/2019   Primary Care Provider: Other, Phys, MD  Admission type:   Chief Complaint   Patient presents with   ??? Chest Pain     Derek PummelRobert Wolford is a 68 y.o. Caucasian male admitted for Chest pain [R07.9]. This consultation was requested by Dr. Gwenette Greetosla Royal, MD.  Has PMH of [name], MI s/p stents, HTN, [name], DM type II, depression, schizophrenia, bipolar, chronic pain, ETOH abuse, tobacco abuse, COPD, RBBB.   Came to [place] on bus to stay with friends but they were not here in [place] when he arrived. Pt is homeless. He Presented to ER with chief c/o chest pain, SOB and cough for past couple of days. Although he tells me that he has had chest pain off and on for past 2-3 months. Left sided chest Pain often starts when he is just resting but chest pain and SOB worsens with exertion. Pain radiates to jaw..Had N&V with episode prior to admission..  States he has had couple ER visits over past couple months for similar pain but never had any stress test or repeat cath. He states his stents were placed 10 years ago (although records denote 2015 (no specific details in chart).  Says he had a cath couple years ago in [place] but no stents. Reports he had cough, fevers 2 weeks ago, now just cough with thick sputum.  He has nl D-dimer and COVID test negative x 1, no fevers here.  States he had quit drinking for 6 years but restarted daily drinking 2 weeks ago on daily basis but has not had drink in 2 days.  Has not taken his medication for a week- says it was stolen. Had chest pain around midnight, EKG with RBBB, nonspeciic st abnormality, and troponins are negative x 3.  Pro BNP 99.    Past Medical History:   Diagnosis Date   ??? A-fib (HCC)    ??? Anxiety    ??? Arthritis    ??? [name] (coronary artery disease)     ??? Chronic obstructive pulmonary disease (HCC)    ??? Depression    ??? Diabetes (HCC)    ??? High cholesterol    ??? Hypertension    ??? MI (myocardial infarction) (HCC)    ??? Schizophrenia (HCC)       Past Surgical History:   Procedure Laterality Date   ??? HX CORONARY STENT PLACEMENT       History reviewed. No pertinent family history.   Social History     Tobacco Use   ??? Smoking status: Current Every Day Smoker     Packs/day: 0.50   Substance Use Topics   ??? Alcohol use: Yes     Alcohol/week: 12.0 standard drinks     Types: 12 Cans of beer per week     Frequency: 4 or more times a week     Drinks per session: 10 or more     Binge frequency: Daily or almost daily      Current Facility-Administered Medications   Medication Dose Route Frequency   ??? sodium chloride (NS) flush 5-40 mL  5-40 mL IntraVENous Q8H   ??? sodium chloride (NS) flush 5-40 mL  5-40 mL IntraVENous PRN   ??? heparin (porcine) injection 5,000 Units  5,000 Units SubCUTAneous Q8H   ??? LORazepam (ATIVAN) injection 2 mg  2 mg IntraVENous Q1H PRN   ??? LORazepam (ATIVAN) injection 4 mg  4 mg IntraVENous Q1H PRN   ??? 0.9% sodium chloride 1,000 mL with folic acid 1 mg, thiamine 100 mg, mvi, adult no.4 with vit K 10 mL infusion   IntraVENous DAILY   ??? prochlorperazine (COMPAZINE) tablet 10 mg  10 mg Oral Q6H PRN   ??? glucose chewable tablet 16 g  4 Tab Oral PRN   ??? glucagon (GLUCAGEN) injection 1 mg  1 mg IntraMUSCular PRN   ??? insulin lispro (HUMALOG) injection   SubCUTAneous AC&HS   ??? atorvastatin (LIPITOR) tablet 10 mg  10 mg Oral QHS   ??? acetaminophen (TYLENOL) tablet 650 mg  650 mg Oral Q4H PRN   ??? morphine injection 1 mg  1 mg IntraVENous Q4H PRN   ??? famotidine (PEPCID) tablet 20 mg  20 mg Oral DAILY   ??? hydrALAZINE (APRESOLINE) 20 mg/mL injection 20 mg  20 mg IntraVENous Q6H PRN   ??? guaiFENesin (ROBITUSSIN) 100 mg/5 mL oral liquid 100 mg  100 mg Oral Q4H PRN   ??? ondansetron (ZOFRAN) injection 4 mg  4 mg IntraVENous Q4H PRN    ??? nicotine (NICODERM CQ) 14 mg/24 hr patch 1 Patch  1 Patch TransDERmal DAILY   ??? albuterol (PROVENTIL HFA, VENTOLIN HFA, PROAIR HFA) inhaler 2 Puff  2 Puff Inhalation Q4H PRN   ??? aspirin chewable tablet 81 mg  81 mg Oral DAILY        Allergies   Allergen Reactions   ??? Bee Venom Protein (Honey Bee) Anaphylaxis   ??? Codeine Nausea and Vomiting   ??? Iodine Hives        Review of Symptoms:  Constitutional: No fevers now. Fevers 2 weeks ago, + fatigue  HEET:  No visual changes. No hearing loss.  Neck: No adenopathy or swelling.  Heart: + chest pain, + intermittent dizziness  Lungs: +SOB with exertion, + cough  Abdomen: No pain.No BRBPR or melena. + diarrhea 2 weeks ago, none now.  Urology: No dysuria or hematuria.  Ext: No edema.  Skin: No acute rashes or ulcers.  Endocrine: No heat or cold intolerance.  Neuro: No transient neurologic deficits.+ BLE numbness, chronic  Psych: + auditory and visual hallucinations.          Subjective:     Visit Vitals  BP 122/68 (BP 1 Location: Right arm, BP Patient Position: At rest)   Pulse 74   Temp 98.5 ??F (36.9 ??C)   Resp 20   Ht 5\' 11"  (1.803 m)   Wt 258 lb 6.4 oz (117.2 kg)   SpO2 96%   BMI 36.04 kg/m??     Physical Exam:  Head: Normocephalic, atraumatic.  Eyes: Pupils equal, round, reactive to light and accomodation., Extra ocular muscles intact. Sclera anicteric.  Ears: Grossly responsive to sound.  Neck: supple  Throat: No sores or erythema.  Heart: Regular rate and rhythm. Normal S1 and S2. No murmurs, gallops, or rub.  Lungs: Scattered course BS. No wheezing, rales,  Abdomen: Soft, non-tender. No guarding or rebound. No hepatosplenomegaly. Bowel sounds active.  Ext: No edema. No ulceration.  Skin: Normal coloration. Warm and dry. No rash.  Neuro: Alert, oriented x 3, MAE. Motor grossly intact,  Affect: Appropriate. Alert and interactive.      Cardiographics:  ECG:  EKG Results     Procedure 720 Value Units Date/Time  EKG 12 LEAD INITIAL [[id number]][[id number]] Collected:  04/07/19 1111     Order Status:  Completed Updated:  04/08/19 0523     Ventricular Rate 85 BPM      Atrial Rate 85 BPM      P-R Interval 194 ms      QRS Duration 122 ms      Q-T Interval 378 ms      QTC Calculation (Bezet) 449 ms      Calculated P Axis 54 degrees      Calculated R Axis -11 degrees      Calculated T Axis 19 degrees      Diagnosis --     Normal sinus rhythm  Right bundle branch block  Cannot rule out Inferior infarct , age undetermined  No previous ECGs available  Confirmed by Arnoldo LenisGiusti, M.D., [place] ([id number]) on 04/08/2019 5:22:57 AM      EKG, 12 LEAD, INITIAL [[id number]] Collected:  04/08/19 0118    Order Status:  Completed Updated:  04/08/19 0124     Ventricular Rate 85 BPM      Atrial Rate 85 BPM      P-R Interval 190 ms      QRS Duration 116 ms      Q-T Interval 362 ms      QTC Calculation (Bezet) 430 ms      Calculated P Axis 63 degrees      Calculated R Axis -28 degrees      Calculated T Axis -2 degrees      Diagnosis --     Normal sinus rhythm  Right bundle branch block  Inferior infarct (cited on or before 07-Apr-2019)  When compared with ECG of 07-Apr-2019 11:11,  Questionable change in initial forces of Inferior leads  Non-specific change in ST segment in Inferior leads      EKG, 12 LEAD, INITIAL [[id number]]     Order Status:  Canceled         Echocardiogram:       Cath:         Data Reviewed:   CMP:   Lab Results   Component Value Date/Time    NA 137 04/08/2019 12:29 AM    K 4.4 04/08/2019 12:29 AM    CL 103 04/08/2019 12:29 AM    CO2 25 04/08/2019 12:29 AM    AGAP 9 04/08/2019 12:29 AM    GLU 193 (H) 04/08/2019 12:29 AM    BUN 19 04/08/2019 12:29 AM    CREA 1.23 04/08/2019 12:29 AM    GFRAA >60 04/08/2019 12:29 AM    GFRNA 59 (L) 04/08/2019 12:29 AM    CA 8.5 04/08/2019 12:29 AM    MG 1.7 04/07/2019 11:12 AM    ALB 2.8 (L) 04/08/2019 12:29 AM    TP 6.7 04/08/2019 12:29 AM    GLOB 3.9 04/08/2019 12:29 AM    AGRAT 0.7 (L) 04/08/2019 12:29 AM    ALT 15 04/08/2019 12:29 AM     CBC:   Lab Results    Component Value Date/Time    WBC 11.0 04/08/2019 12:29 AM    HGB 13.8 04/08/2019 12:29 AM    HCT 42.5 04/08/2019 12:29 AM    PLT 277 04/08/2019 12:29 AM       Lab Results   Component Value Date/Time    NT pro-BNP 99 04/07/2019 11:12 AM        Lab Results   Component Value Date/Time    Troponin-I, Qt. <0.05 04/08/2019 02:02 AM  Last Lipid:    Lab Results   Component Value Date/Time    Cholesterol, total 201 (H) 04/08/2019 12:29 AM    HDL Cholesterol 36 04/08/2019 12:29 AM    LDL, calculated 123.2 (H) 04/08/2019 12:29 AM    Triglyceride 209 (H) 04/08/2019 12:29 AM    CHOL/HDL Ratio 5.6 (H) 04/08/2019 12:29 AM       ABG: No results found for: PH, PHI, PCO2, PCO2I, PO2, PO2I, HCO3, HCO3I, FIO2, FIO2I  COAGS:   Lab Results   Component Value Date/Time    APTT 26.8 04/08/2019 12:29 AM    PTP 10.4 04/08/2019 12:29 AM    INR 1.0 04/08/2019 12:29 AM        Assessment:         Hospital Problems  Date Reviewed: 04-25-19          Codes Class Noted POA    Chest pain ICD-10-CM: R07.9  ICD-9-CM: 786.50  2019/04/25 Unknown               Plan:     68 y.o. male with PMH of [name], stents, uncontrolled DM, HTN, [name], tobacco use presents with chief c/o SOB, cough, chest pain.  Reports increased fatigue as well as exertional SOB and chest pain for past 2-3 months.  Has ruled out for MI as serial troponins are negative, EKG NSR with RBBB (old) and nonspecific st abnormality inferolateral leads.  Given history of [name] and multiple risk factors (HTN, [name], DM, smoker) will proceed with LHC today to evaluate Coronary arteries.      1. Chest pain:  Typical features   -Troponins negative, EKG NSR with RBB, ST abnormality inferior lateral leads   -Has ruled out for MI but given history of [name], multiple risk factors, proceed with LHC.    2. Hx of [name] s/p stent ?2015 vs 10 years ago   -ASA, statin.  Was on plavix in 2019   -echo pending    3. HTN: BP controlled   -on lisinopril PTA     4. [name]: LDL 123, HDL 36   -currently lipitor 10 mg daily     5. DM uncontrolled   -A1C = 9.1   - Management per primary team    6. Tobacco and ETOH abuse   -Advise cessation    7.Hallucinations/ reported Hx of bipolar, schizophrenia   -psych consulted  8. COPD   -inhaler, guaifenesin. Management per primary team.    Vilinda Blanks. Erinn Huskins, NP  04/08/2019, 9:04 AM    [facility] Office:  [address]  [address]  [place] [address]  P: [phone]  F: [place] Office:  [address] [place] [address]  P: [phone]  F: [phone]

## 2019-04-08 NOTE — Consults (Signed)
Attempted to see patient re psych eval, but he is currently at cath lab. Will try to see patient tomorrow.

## 2019-04-08 NOTE — Progress Notes (Addendum)
TOC:    RUR: N/A    CM s/w Diabetes educator today regarding her discussion with patient and his concerns going forward. Patient does not have a reliable place to stay at discharge and will need assistance with some sort of housing/placement.    Patient did voice interest in getting help with maintaining sobriety as well.    Once patient moves off the floor, CM will discuss options with him including: sober houses, shelters, etc.    Patient also needs to be evaluated by psychiatry.     Will follow.    S. Caitlin Helton BSW, ACM

## 2019-04-08 NOTE — Procedures (Addendum)
Findings:  1)Normal LVEDP  2)Angiographically moderate to severe disease in mid LAD(70%) not functionally significant based on iFR(0.93).  3)Mild disease elsewhere  4)No coronary stents noted.    Recommendations  1)Consider alternative etiology for chest pain  2)GDT for primary prevention of [name] events    Access: Right radial--no issues    Contrast 50 cc

## 2019-04-08 NOTE — Progress Notes (Signed)
Problem: Unstable angina/NSTEMI: Day of Admission/Day 1  Goal: Activity/Safety  04/08/2019 1026 by Williams, Sarah E, RN  Outcome: Progressing Towards Goal  04/07/2019 2118 by Williams, Sarah E, RN  Outcome: Progressing Towards Goal  Goal: Diagnostic Test/Procedures  04/08/2019 1026 by Williams, Sarah E, RN  Outcome: Progressing Towards Goal  04/07/2019 2118 by Williams, Sarah E, RN  Outcome: Progressing Towards Goal

## 2019-04-08 NOTE — Progress Notes (Addendum)
TRANSFER - OUT REPORT:    Verbal report given to Monique  RN on Derek Mclaughlin being transferred to Room 400 for routine progression of care       Report consisted of patient???s Situation, Background, Assessment and   Recommendations(SBAR).     Information from the following report(s) SBAR, Procedure Summary, MAR, Recent Results and Cardiac Rhythm NSR was reviewed with the receiving nurse.    Opportunity for questions and clarification was provided.

## 2019-04-08 NOTE — Progress Notes (Addendum)
Cardiac Cath Lab Procedure Area Arrival Note:    Derek Mclaughlin arrived to Cardiac Cath Lab, Procedure Area. Patient identifiers verified with NAME and DATE OF BIRTH. Procedure verified with patient. Consent forms verified. Allergies verified. Patient informed of procedure and plan of care. Questions answered with review. Patient voiced understanding of procedure and plan of care.    Patient on cardiac monitor, non-invasive blood pressure, SPO2 monitor. On O2 @ 2 lpm via NC.  IV of NS on pump at 75 ml/hr. Patient status doing well without problems. Patient is A&Ox 4. Patient reports 7/10 chest pain.     Patient medicated during procedure with orders obtained and verified by Dr. Kaushik.    Refer to patients Cardiac Cath Lab PROCEDURE REPORT for vital signs, assessment, status, and response during procedure, printed at end of case. Printed report on chart or scanned into chart.    TRANSFER - OUT REPORT:    Verbal report given to [place].RN on Derek Mclaughlin being transferred to cath lab recovery for routine progression of care       Report consisted of patient???s Situation, Background, Assessment and   Recommendations(SBAR).     Information from the following report(s) Procedure Summary was reviewed with the receiving nurse.    Opportunity for questions and clarification was provided.

## 2019-04-08 NOTE — Progress Notes (Signed)
Bedside and Verbal shift change report given to Dahlia Client T (oncoming nurse) by Tonie Griffith (offgoing nurse). Report included the following information SBAR, Kardex, Intake/Output, MAR and Cardiac Rhythm NSR.

## 2019-04-08 NOTE — Progress Notes (Signed)
Renal Dosing/Monitoring  Medication: Famotidine   Current regimen:  20 mg PO every 24 hr  Recent Labs     04/08/19  0029 04/07/19  1112   CREA 1.23 1.09   BUN 19 15     Estimated CrCl:  ~75 ml/min  Plan: Change to 20 mg PO every 12 hours per [facility] P&T Committee Protocol with respect to renal function.  Pharmacy will continue to monitor patient daily and will make dosage adjustments based upon changing renal function.    Tobias Alexander, PharmD, BCPS

## 2019-04-08 NOTE — Progress Notes (Signed)
TOC:    RUR: N/A    CM s/w Diabetes educator today regarding her discussion with patient and his concerns going forward. Patient does not have a reliable place to stay at discharge and will need assistance with some sort of housing/placement.    Patient did voice interest in getting help with maintaining sobriety as well.    Once patient moves off the floor, CM will discuss options with him including: sober houses, shelters, etc.    Patient also needs to be evaluated by psychiatry.     Will follow.    S. Caitlin Helton BSW, ACM

## 2019-04-08 NOTE — Progress Notes (Signed)
 Cardiac Cath Lab Procedure Area Arrival Note:    Derek Mclaughlin arrived to Cardiac Cath Lab, Procedure Area. Patient identifiers verified with NAME and DATE OF BIRTH. Procedure verified with patient. Consent forms verified. Allergies verified. Patient informed of procedure and plan of care. Questions answered with review. Patient voiced understanding of procedure and plan of care.    Patient on cardiac monitor, non-invasive blood pressure, SPO2 monitor. On O2 @ 2 lpm via NC.  IV of NS on pump at 75 ml/hr. Patient status doing well without problems. Patient is A&Ox 4. Patient reports 7/10 chest pain.     Patient medicated during procedure with orders obtained and verified by Dr. Orvil.    Refer to patients Cardiac Cath Lab PROCEDURE REPORT for vital signs, assessment, status, and response during procedure, printed at end of case. Printed report on chart or scanned into chart.    TRANSFER - OUT REPORT:    Verbal report given to [place].RN on Derek Mclaughlin being transferred to cath lab recovery for routine progression of care       Report consisted of patient's Situation, Background, Assessment and   Recommendations(SBAR).     Information from the following report(s) Procedure Summary was reviewed with the receiving nurse.    Opportunity for questions and clarification was provided.

## 2019-04-08 NOTE — Consults (Signed)
Attempted to see patient re psych eval, but he is currently at cath lab. Will try to see patient tomorrow.

## 2019-04-08 NOTE — Progress Notes (Signed)
Problem: Unstable angina/NSTEMI: Day of Admission/Day 1  Goal: Activity/Safety  04/08/2019 1026 by Elberta Leatherwood, RN  Outcome: Progressing Towards Goal  04/07/2019 2118 by Elberta Leatherwood, RN  Outcome: Progressing Towards Goal  Goal: Diagnostic Test/Procedures  04/08/2019 1026 by Elberta Leatherwood, RN  Outcome: Progressing Towards Goal  04/07/2019 2118 by Elberta Leatherwood, RN  Outcome: Progressing Towards Goal

## 2019-04-08 NOTE — Progress Notes (Signed)
Problem: Diabetes Self-Management  Goal: *Prevention, detection, treatment of acute complications  Description: List symptoms of hyper- and hypoglycemia; describe how to treat low blood sugar and actions for lowering  high blood glucose level.  Outcome: Progressing Towards Goal     Problem: Patient Education: Go to Patient Education Activity  Goal: Patient/Family Education  Outcome: Progressing Towards Goal     Problem: Unstable angina/NSTEMI: Day of Admission/Day 1  Goal: Diagnostic Test/Procedures  Outcome: Progressing Towards Goal  Goal: Nutrition/Diet  Outcome: Progressing Towards Goal  Goal: Medications  Outcome: Progressing Towards Goal  Goal: Respiratory  Outcome: Progressing Towards Goal     Problem: Cath Lab Procedures: Post-Cath Day of Procedure (Initiate SCIP Measures for Post-Op Care)  Goal: Medications  Outcome: Progressing Towards Goal

## 2019-04-08 NOTE — Procedures (Signed)
Findings:  1)Normal LVEDP  2)Angiographically moderate to severe disease in mid LAD(70%) not functionally significant based on iFR(0.93).  3)Mild disease elsewhere  4)No coronary stents noted.    Recommendations  1)Consider alternative etiology for chest pain  2)GDT for primary prevention of [name] events    Access: Right radial--no issues    Contrast 50 cc

## 2019-04-08 NOTE — Progress Notes (Signed)
TRANSFER - IN REPORT:    Verbal report received from [facility] RN on Harison Olesky  being received from procedure area for routine progression of care. Report consisted of patient's Situation, Background, Assessment and Recommendations(SBAR). Information from the following report(s) SBAR, Procedure Summary, MAR, Recent Results and Cardiac Rhythm NSR was reviewed with the receiving clinician. Opportunity for questions and clarification was provided. Assessment completed upon patient's arrival to Cardiac Cath Lab RECOVERY AREA and care assumed.       Cardiac Cath Lab Recovery Arrival Note:    Damone Spurgin arrived to CCL recovery area.  Patient procedure= LHC. Patient on cardiac monitor, non-invasive blood pressure, SPO2 monitor. On Room Air   IV  of NS on pump at 75 ml/hr. Patient status doing well without problems. Patient is A&Ox 4. Patient reports No Pain.    PROCEDURE SITE CHECK:    Procedure site:without any bleeding and No Hematoma, No pain/discomfort reported at procedure site.     No change in patient status. Continue to monitor patient and status.

## 2019-04-08 NOTE — Progress Notes (Signed)
TRANSFER - OUT REPORT:    Verbal report given to [facility]  RN on Gamble Kniceley being transferred to Room 400 for routine progression of care       Report consisted of patient's Situation, Background, Assessment and   Recommendations(SBAR).     Information from the following report(s) SBAR, Procedure Summary, MAR, Recent Results and Cardiac Rhythm NSR was reviewed with the receiving nurse.    Opportunity for questions and clarification was provided.

## 2019-04-08 NOTE — Progress Notes (Signed)
0038 - complained of chest pressure - rated 8 on pain scale.  12 lead ekg obtained and reviewed by CCU RR nurse - no changes.  Zola Button NP notified  and a repeat Troponin was obtained. Morphine was given which relieved patient's pain.     0400  - Resting quietly.  No further complaints of chest pain.

## 2019-04-08 NOTE — Progress Notes (Signed)
 TRANSFER - IN REPORT:    Verbal report received from Sarah RN(name) on Derek Mclaughlin  being received from Room 400(unit) for routine progression of care      Report consisted of patient's Situation, Background, Assessment and   Recommendations(SBAR).     Information from the following report(s) SBAR, Procedure Summary, MAR, Recent Results and Cardiac Rhythm Sinus Bradycardia was reviewed with the receiving nurse.    Opportunity for questions and clarification was provided.      Assessment completed upon patient's arrival to unit and care assumed.

## 2019-04-08 NOTE — Progress Notes (Signed)
Progress  Notes by Cheryll Cockayne, MD at 04/08/19 1753                Author: Cheryll Cockayne, MD  Service: Hospitalist  Author Type: Physician       Filed: 04/08/19 2039  Date of Service: 04/08/19 1753  Status: Signed          Editor: Cheryll Cockayne, MD (Physician)                                                Hospitalist Progress Note         Hospital summary: Derek Mclaughlin is a 68 y.o. male who presents with PMH of DM, [name], HTN, [name], COPD here with c/o  chest pain x1 week and worsening since last night with associated N/V and also having shortness of breath and cough.??He describes pain as tight heaviness to left side of chest and radiates up to neck and jaw. ??He is visiting from [place] and states  he was supposed to living with friends when he got here but the house was vacant when he arrived. Has lived in [place] until he ran out of money now homeless. He has been out of medications for about 1 week prior to coming to ED. He states he has a  history of alcohol dependence and restarted drinking alcohol about 2 weeks prior to coming to ED and drinks 12 beers/day. He states he has a hx of bipolar and schizophrenia and has had an increase in symptoms of depression over last few months, he  stated he doesn't care if he lives but denies suicidal and homicidal ideations.He states he is having auditory and visual hallucinations x2 weeks where he hears his deceased wifes voice and thinks he has seen her in a room with him.    Work up in ED: negative CXR, EKG, cardiology consult, ECHO ordered, labs reviewed, psych consult, r/o COVID.  04/07/2019         Assessment/Plan:   Atypical Chest Pain:    - likely due to anxiety   -XFG:HWEXHB sinus rhythm, Inferior infarct, age undetermined, RBBB   -troponin negative x 3 negative   -ECHO: EF 55 - 60%. No regional wall motion abnormality noted. Mild (grade 1) left ventricular diastolic dysfunction.   -cardiology on board   - cardiac cath 6/9: Normal LVEDP.       [name]:     -lipid panel LDL 123   - C/w statin   ??   DM:   - pt states he takes insulin   - HGA1c 9   - started on NPH 10U bid. C/w ISS   ??   HTN: unable to recall home meds   -monitor BP   -prn hydralazine   ??   COPD: current cough and SOB   -monitor O2 sats goal for sats >92%   -CXR negative   -guaifenesin as needed for cough   -albuterol inhaler as needed, only home med- consider maintenance meds on discharge   - COVID 19 negative ??      Severe anxiety??   C/w Hallucinations: hx of Bipolar and schizophrenia.    - Auditory and visual hallucinations of deceased wife.    -denies HI and SI   -avoid narcotics. States he takes xanax 12m tid - which I explained not indicated now,    -will  wait for psych eval    - hydroxyzine prn        ETOH Dependence:    - drinks 12 beers/day.   -CIWA protocol has been 0   -no s/s withdrawal   ??   Tobacco Dependence: current smoker 2ppd-1/2ppd.   -not interested in quitting, counseled for cessation   -nicotine patch   ??   Homelessness   -case management consult for resources   ??   Code status: Full   DVT prophylaxis: heparin    Disposition: TBD   ----------------------------------------------      CC: Chest pain      S: Patient is seen and examined at bedside. He had cardiac cath this AM, which was normal.    Patient has multiple complaints - abd pain - vague - abd [place] ordered. Leg swelling, hx DVts - venous duplex negative now. Severe anxiety - requesting xanax which I refused.       Review of Systems:   As above       O:   Visit Vitals      BP  148/85 (BP 1 Location: Left arm, BP Patient Position: At rest)        Pulse  89     Temp  98.1 ??F (36.7 ??C)     Resp  20     Ht  5' 10" (1.778 m)     Wt  117.2 kg (258 lb 6.1 oz)     SpO2  93%        BMI  37.07 kg/m??           PHYSICAL EXAM:   Gen: NAD, obese    HEENT: anicteric sclerae, normal conjunctiva, oropharynx clear, MM moist   Neck: supple, trachea midline, no adenopathy   Heart: RRR, no MRG, no JVD, no peripheral edema   Lungs: CTA b/l,  non-labored respirations   Abd: soft, NT, ND, BS+, no organomegaly   Extr: warm   Skin: dry, no rash   Neuro: CN II-XII grossly intact, normal speech, moves all extremities   Psych:  Anxious          Intake/Output Summary (Last 24 hours) at 04/08/2019 1753   Last data filed at 04/08/2019 1234     Gross per 24 hour        Intake  120 ml        Output  200 ml        Net  -80 ml            Recent labs & imaging reviewed:     Recent Results (from the past 24 hour(s))     GLUCOSE, POC          Collection Time: 04/07/19 10:02 PM         Result  Value  Ref Range            Glucose (POC)  218 (H)  65 - 100 mg/dL       Performed by  Dulal Milan         METABOLIC PANEL, COMPREHENSIVE          Collection Time: 04/08/19 12:29 AM         Result  Value  Ref Range            Sodium  137  136 - 145 mmol/L       Potassium  4.4  3.5 - 5.1 mmol/L       Chloride  103  97 - 108 mmol/L       CO2  25  21 - 32 mmol/L       Anion gap  9  5 - 15 mmol/L       Glucose  193 (H)  65 - 100 mg/dL       BUN  19  6 - 20 MG/DL       Creatinine  1.23  0.70 - 1.30 MG/DL       BUN/Creatinine ratio  15  12 - 20         GFR est AA  >60  >60 ml/min/1.29m       GFR est non-AA  59 (L)  >60 ml/min/1.747m      Calcium  8.5  8.5 - 10.1 MG/DL       Bilirubin, total  0.3  0.2 - 1.0 MG/DL       ALT (SGPT)  15  12 - 78 U/L       AST (SGOT)  18  15 - 37 U/L       Alk. phosphatase  76  45 - 117 U/L       Protein, total  6.7  6.4 - 8.2 g/dL       Albumin  2.8 (L)  3.5 - 5.0 g/dL       Globulin  3.9  2.0 - 4.0 g/dL       A-G Ratio  0.7 (L)  1.1 - 2.2         CBC WITH AUTOMATED DIFF          Collection Time: 04/08/19 12:29 AM         Result  Value  Ref Range            WBC  11.0  4.1 - 11.1 K/uL       RBC  4.77  4.10 - 5.70 M/uL       HGB  13.8  12.1 - 17.0 g/dL       HCT  42.5  36.6 - 50.3 %       MCV  89.1  80.0 - 99.0 FL       MCH  28.9  26.0 - 34.0 PG       MCHC  32.5  30.0 - 36.5 g/dL       RDW  14.4  11.5 - 14.5 %       PLATELET  277  150 - 400 K/uL       MPV  12.7   8.9 - 12.9 FL       NRBC  0.0  0 PER 100 WBC       ABSOLUTE NRBC  0.00  0.00 - 0.01 K/uL       NEUTROPHILS  63  32 - 75 %       LYMPHOCYTES  26  12 - 49 %       MONOCYTES  7  5 - 13 %       EOSINOPHILS  2  0 - 7 %       BASOPHILS  1  0 - 1 %       IMMATURE GRANULOCYTES  1 (H)  0.0 - 0.5 %       ABS. NEUTROPHILS  7.0  1.8 - 8.0 K/UL       ABS. LYMPHOCYTES  2.8  0.8 - 3.5 K/UL       ABS. MONOCYTES  0.8  0.0 - 1.0 K/UL  ABS. EOSINOPHILS  0.2  0.0 - 0.4 K/UL       ABS. BASOPHILS  0.1  0.0 - 0.1 K/UL       ABS. IMM. GRANS.  0.1 (H)  0.00 - 0.04 K/UL       DF  AUTOMATED          PROTHROMBIN TIME + INR          Collection Time: 04/08/19 12:29 AM         Result  Value  Ref Range            INR  1.0  0.9 - 1.1         Prothrombin time  10.4  9.0 - 11.1 sec       PTT          Collection Time: 04/08/19 12:29 AM         Result  Value  Ref Range            aPTT  26.8  22.1 - 32.0 sec       aPTT, therapeutic range       58.0 - 77.0 SECS       FIBRINOGEN          Collection Time: 04/08/19 12:29 AM         Result  Value  Ref Range            Fibrinogen  508 (H)  200 - 475 mg/dL       D DIMER          Collection Time: 04/08/19 12:29 AM         Result  Value  Ref Range            D-dimer  1.47 (H)  0.00 - 0.65 mg/L FEU       LIPID PANEL          Collection Time: 04/08/19 12:29 AM         Result  Value  Ref Range            LIPID PROFILE               Cholesterol, total  201 (H)  <200 MG/DL       Triglyceride  209 (H)  <150 MG/DL       HDL Cholesterol  36  MG/DL       LDL, calculated  123.2 (H)  0 - 100 MG/DL       VLDL, calculated  41.8  MG/DL       CHOL/HDL Ratio  5.6 (H)  0.0 - 5.0         EKG, 12 LEAD, INITIAL          Collection Time: 04/08/19  1:18 AM         Result  Value  Ref Range            Ventricular Rate  85  BPM       Atrial Rate  85  BPM       P-R Interval  190  ms       QRS Duration  116  ms       Q-T Interval  362  ms       QTC Calculation (Bezet)  430  ms       Calculated P Axis  63  degrees       Calculated R  Axis  -28  degrees  Calculated T Axis  -2  degrees       Diagnosis                 Normal sinus rhythm   Right bundle branch block      When compared with ECG of 07-Apr-2019 11:11,   No significant change   Confirmed by Cammy Copa MD. ([id number]) on 04/08/2019 4:20:38 PM          CULTURE, RESPIRATORY/SPUTUM/BRONCH W Lonell Grandchild STAIN          Collection Time: 04/08/19  1:33 AM         Result  Value  Ref Range            Special Requests:  NO SPECIAL REQUESTS          GRAM STAIN  OCCASIONAL WBCS SEEN          GRAM STAIN  3+ GRAM POSITIVE COCCI          GRAM STAIN  FEW GRAM NEGATIVE RODS          Culture result:  PENDING         TROPONIN I          Collection Time: 04/08/19  2:02 AM         Result  Value  Ref Range            Troponin-I, Qt.  <0.05  <0.05 ng/mL       URINALYSIS W/ RFLX MICROSCOPIC          Collection Time: 04/08/19  2:07 AM         Result  Value  Ref Range            Color  YELLOW/STRAW          Appearance  CLEAR  CLEAR         Specific gravity  1.019  1.003 - 1.030         pH (UA)  5.0  5.0 - 8.0         Protein  100 (A)  NEG mg/dL       Glucose  Negative  NEG mg/dL       Ketone  Negative  NEG mg/dL       Bilirubin  Negative  NEG         Blood  LARGE (A)  NEG         Urobilinogen  0.2  0.2 - 1.0 EU/dL       Nitrites  Negative  NEG         Leukocyte Esterase  Negative  NEG         WBC  0-4  0 - 4 /hpf       RBC  50-100  0 - 5 /hpf       Epithelial cells  FEW  FEW /lpf       Bacteria  Negative  NEG /hpf       Mucus  TRACE (A)  NEG /lpf       GLUCOSE, POC          Collection Time: 04/08/19  8:14 AM         Result  Value  Ref Range            Glucose (POC)  168 (H)  65 - 100 mg/dL       Performed by  Arlester Marker E         ECHO ADULT COMPLETE  Collection Time: 04/08/19  9:35 AM         Result  Value  Ref Range            Ao Root D  3.78  cm       LVIDd  4.37  4.2 - 5.9 cm       LVPWd  1.33 (A)  0.6 - 1.0 cm       LVIDs  3.09  cm       IVSd  1.26 (A)  0.6 - 1.0 cm       LVOT d  2.10  cm        LVOT Peak Velocity  73.68  cm/s       LVOT Peak Gradient  2.2  mmHg       MVA (PHT)  2.1  cm2       MV A Vel  61.46  cm/s       MV E Vel  55.56  cm/s       MV E/A  0.90         Left Atrium to Aortic Root Ratio  1.12         RVIDd  3.45  cm       Inferior Vena Cava Diameter Sniffing  1.80  cm       LV Mass AL  250.8 (A)  88 - 224 g       LV Mass AL Index  107.8  49 - 115 g/m2       Mitral Valve E Wave Deceleration Time  365.5  ms       Mitral Valve Pressure Half-time  106.0  ms       Left Atrium Major Axis  4.22  cm       Left Ventricular Fractional Shortening by 2D  [phone]  %       Mitral Valve Deceleration Slope  [phone]         GLUCOSE, POC          Collection Time: 04/08/19 11:40 AM         Result  Value  Ref Range            Glucose (POC)  320 (H)  65 - 100 mg/dL       Performed by  Darcel Bayley         GLUCOSE, POC          Collection Time: 04/08/19  5:30 PM         Result  Value  Ref Range            Glucose (POC)  212 (H)  65 - 100 mg/dL            Performed by  Gerald Leitz K            Recent Labs            04/08/19   0029  04/07/19   1112     WBC  11.0  11.5*     HGB  13.8  14.6     HCT  42.5  44.6         PLT  277  228          Recent Labs            04/08/19   0029  04/07/19   1112     NA  137  137     K  4.4  3.8  CL  103  105     CO2  25  25     BUN  19  15     CREA  1.23  1.09     GLU  193*  225*     CA  8.5  8.6         MG   --   1.7          Recent Labs            04/08/19   0029  04/07/19   1112     ALT  15  14     AP  76  74     TBILI  0.3  0.5     TP  6.7  6.7     ALB  2.8*  2.9*     GLOB  3.9  3.8         LPSE   --   124          Recent Labs             04/08/19   0029  04/07/19   1649  04/07/19   1112     INR  1.0  1.0  0.9     PTP  10.4  10.3  9.8          APTT  26.8  26.8   --            Recent Labs           04/07/19   1649        FERR  131         No results found for: FOL, RBCF    No results for input(s): PH, PCO2, PO2 in the last 72 hours.     Recent Labs              04/08/19   0202  04/07/19   1649  04/07/19   1112          TROIQ  <0.05  <0.05  <0.05          Lab Results         Component  Value  Date/Time            Cholesterol, total  201 (H)  04/08/2019 12:29 AM       HDL Cholesterol  36  04/08/2019 12:29 AM       LDL, calculated  123.2 (H)  04/08/2019 12:29 AM       Triglyceride  209 (H)  04/08/2019 12:29 AM            CHOL/HDL Ratio  5.6 (H)  04/08/2019 12:29 AM          Lab Results         Component  Value  Date/Time            Glucose (POC)  212 (H)  04/08/2019 05:30 PM       Glucose (POC)  320 (H)  04/08/2019 11:40 AM       Glucose (POC)  168 (H)  04/08/2019 08:14 AM       Glucose (POC)  218 (H)  04/07/2019 10:02 PM            Glucose (POC)  156 (H)  04/07/2019 05:11 PM          Lab Results         Component  Value  Date/Time  Color  YELLOW/STRAW  04/08/2019 02:07 AM       Appearance  CLEAR  04/08/2019 02:07 AM       Specific gravity  1.019  04/08/2019 02:07 AM       pH (UA)  5.0  04/08/2019 02:07 AM       Protein  100 (A)  04/08/2019 02:07 AM       Glucose  Negative  04/08/2019 02:07 AM       Ketone  Negative  04/08/2019 02:07 AM       Bilirubin  Negative  04/08/2019 02:07 AM       Urobilinogen  0.2  04/08/2019 02:07 AM       Nitrites  Negative  04/08/2019 02:07 AM       Leukocyte Esterase  Negative  04/08/2019 02:07 AM       Epithelial cells  FEW  04/08/2019 02:07 AM       Bacteria  Negative  04/08/2019 02:07 AM       WBC  0-4  04/08/2019 02:07 AM            RBC  50-100  04/08/2019 02:07 AM           Med list reviewed     Current Facility-Administered Medications          Medication  Dose  Route  Frequency           ?  0.9% sodium chloride infusion   75 mL/hr  IntraVENous  CONTINUOUS     ?  0.9% sodium chloride infusion 500 mL   500 mL  IntraVENous  CONTINUOUS     ?  sodium chloride (NS) flush 5-40 mL   5-40 mL  IntraVENous  Q8H     ?  sodium chloride (NS) flush 5-40 mL   5-40 mL  IntraVENous  PRN     ?  famotidine (PEPCID) tablet 20 mg   20 mg  Oral   BID     ?  morphine injection 0.5 mg   0.5 mg  IntraVENous  Q6H PRN     ?  insulin NPH (NOVOLIN N, HUMULIN N) injection 10 Units   10 Units  SubCUTAneous  ACB/HS     ?  sodium chloride (NS) flush 5-40 mL   5-40 mL  IntraVENous  Q8H     ?  sodium chloride (NS) flush 5-40 mL   5-40 mL  IntraVENous  PRN     ?  heparin (porcine) injection 5,000 Units   5,000 Units  SubCUTAneous  Q8H     ?  0.9% sodium chloride 9,233 mL with folic acid 1 mg, thiamine 100 mg, mvi, adult no.4 with vit K 10 mL infusion     IntraVENous  DAILY     ?  prochlorperazine (COMPAZINE) tablet 10 mg   10 mg  Oral  Q6H PRN     ?  glucose chewable tablet 16 g   4 Tab  Oral  PRN     ?  glucagon (GLUCAGEN) injection 1 mg   1 mg  IntraMUSCular  PRN     ?  insulin lispro (HUMALOG) injection     SubCUTAneous  AC&HS     ?  atorvastatin (LIPITOR) tablet 10 mg   10 mg  Oral  QHS     ?  acetaminophen (TYLENOL) tablet 650 mg   650 mg  Oral  Q4H PRN     ?  hydrALAZINE (APRESOLINE) 20 mg/mL injection  20 mg   20 mg  IntraVENous  Q6H PRN     ?  guaiFENesin (ROBITUSSIN) 100 mg/5 mL oral liquid 100 mg   100 mg  Oral  Q4H PRN     ?  ondansetron (ZOFRAN) injection 4 mg   4 mg  IntraVENous  Q4H PRN     ?  nicotine (NICODERM CQ) 14 mg/24 hr patch 1 Patch   1 Patch  TransDERmal  DAILY     ?  albuterol (PROVENTIL HFA, VENTOLIN HFA, PROAIR HFA) inhaler 2 Puff   2 Puff  Inhalation  Q4H PRN           ?  aspirin chewable tablet 81 mg   81 mg  Oral  DAILY           Care Plan discussed with:   Patient/Family and Nurse      Cheryll Cockayne, MD   Internal Medicine   Date of Service: 04/08/2019
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
## 2019-04-09 ENCOUNTER — Inpatient Hospital Stay: Admit: 2019-04-09 | Payer: MEDICARE | Primary: Family Medicine

## 2019-04-09 ENCOUNTER — Inpatient Hospital Stay: Payer: MEDICARE | Primary: Family Medicine

## 2019-04-09 LAB — CBC WITH AUTO DIFFERENTIAL
Basophils %: 1 % (ref 0–1)
Basophils Absolute: 0.1 10*3/uL (ref 0.0–0.1)
Eosinophils %: 2 % (ref 0–7)
Eosinophils Absolute: 0.2 10*3/uL (ref 0.0–0.4)
Granulocyte Absolute Count: 0.1 10*3/uL — ABNORMAL HIGH (ref 0.00–0.04)
Hematocrit: 43.2 % (ref 36.6–50.3)
Hemoglobin: 14.1 g/dL (ref 12.1–17.0)
Immature Granulocytes: 1 % — ABNORMAL HIGH (ref 0.0–0.5)
Lymphocytes %: 26 % (ref 12–49)
Lymphocytes Absolute: 2.8 10*3/uL (ref 0.8–3.5)
MCH: 28.7 PG (ref 26.0–34.0)
MCHC: 32.6 g/dL (ref 30.0–36.5)
MCV: 88 FL (ref 80.0–99.0)
MPV: 11.6 FL (ref 8.9–12.9)
Monocytes %: 7 % (ref 5–13)
Monocytes Absolute: 0.8 10*3/uL (ref 0.0–1.0)
NRBC Absolute: 0 10*3/uL (ref 0.00–0.01)
Neutrophils %: 63 % (ref 32–75)
Neutrophils Absolute: 6.9 10*3/uL (ref 1.8–8.0)
Nucleated RBCs: 0 PER 100 WBC
Platelets: 244 10*3/uL (ref 150–400)
RBC: 4.91 M/uL (ref 4.10–5.70)
RDW: 13.6 % (ref 11.5–14.5)
WBC: 10.8 10*3/uL (ref 4.1–11.1)

## 2019-04-09 LAB — POCT GLUCOSE
POC Glucose: 143 mg/dL — ABNORMAL HIGH (ref 65–100)
POC Glucose: 150 mg/dL — ABNORMAL HIGH (ref 65–100)
POC Glucose: 155 mg/dL — ABNORMAL HIGH (ref 65–100)
POC Glucose: 147 mg/dL — ABNORMAL HIGH (ref 65–100)
POC Glucose: 142 mg/dL — ABNORMAL HIGH (ref 65–100)

## 2019-04-09 LAB — PROTIME-INR
INR: 1 (ref 0.9–1.1)
Protime: 10.3 s (ref 9.0–11.1)

## 2019-04-09 LAB — BASIC METABOLIC PANEL
Anion Gap: 10 mmol/L (ref 5–15)
BUN: 16 MG/DL (ref 6–20)
Bun/Cre Ratio: 16 (ref 12–20)
CO2: 23 mmol/L (ref 21–32)
Calcium: 8.2 MG/DL — ABNORMAL LOW (ref 8.5–10.1)
Chloride: 105 mmol/L (ref 97–108)
Creatinine: 0.98 MG/DL (ref 0.70–1.30)
EGFR IF NonAfrican American: >60 mL/min/{1.73_m2} (ref >60)
GFR African American: >60 mL/min/{1.73_m2} (ref >60)
Glucose: 140 mg/dL — ABNORMAL HIGH (ref 65–100)
Potassium: 3.4 mmol/L — ABNORMAL LOW (ref 3.5–5.1)
Sodium: 138 mmol/L (ref 136–145)

## 2019-04-09 LAB — APTT: aPTT: 27.1 s (ref 22.1–32.0)

## 2019-04-09 LAB — D-DIMER, QUANTITATIVE: D-Dimer, Quant: 0.55 mg/L FEU (ref 0.00–0.65)

## 2019-04-09 LAB — FIBRINOGEN
Fibrinogen: 596 mg/dL — ABNORMAL HIGH (ref 200–475)
Fibrinogen: 596 mg/dL — ABNORMAL HIGH (ref 200–475)

## 2019-04-09 LAB — METABOLIC PANEL, BASIC
Anion gap: 10 mmol/L (ref 5–15)
BUN/Creatinine ratio: 16 (ref 12–20)
BUN: 16 MG/DL (ref 6–20)
CO2: 23 mmol/L (ref 21–32)
Calcium: 8.2 MG/DL — ABNORMAL LOW (ref 8.5–10.1)
Chloride: 105 mmol/L (ref 97–108)
Creatinine: 0.98 MG/DL (ref 0.70–1.30)
GFR est AA: >60 mL/min/{1.73_m2} (ref >60)
GFR est non-AA: >60 mL/min/{1.73_m2} (ref >60)
Glucose: 140 mg/dL — ABNORMAL HIGH (ref 65–100)
Potassium: 3.4 mmol/L — ABNORMAL LOW (ref 3.5–5.1)
Sodium: 138 mmol/L (ref 136–145)

## 2019-04-09 LAB — CBC WITH AUTOMATED DIFF
ABS. BASOPHILS: 0.1 10*3/uL (ref 0.0–0.1)
ABS. EOSINOPHILS: 0.2 10*3/uL (ref 0.0–0.4)
ABS. IMM. GRANS.: 0.1 10*3/uL — ABNORMAL HIGH (ref 0.00–0.04)
ABS. LYMPHOCYTES: 2.8 10*3/uL (ref 0.8–3.5)
ABS. MONOCYTES: 0.8 10*3/uL (ref 0.0–1.0)
ABS. NEUTROPHILS: 6.9 10*3/uL (ref 1.8–8.0)
ABSOLUTE NRBC: 0 10*3/uL (ref 0.00–0.01)
BASOPHILS: 1 % (ref 0–1)
EOSINOPHILS: 2 % (ref 0–7)
HCT: 43.2 % (ref 36.6–50.3)
HGB: 14.1 g/dL (ref 12.1–17.0)
IMMATURE GRANULOCYTES: 1 % — ABNORMAL HIGH (ref 0.0–0.5)
LYMPHOCYTES: 26 % (ref 12–49)
MCH: 28.7 PG (ref 26.0–34.0)
MCHC: 32.6 g/dL (ref 30.0–36.5)
MCV: 88 FL (ref 80.0–99.0)
MONOCYTES: 7 % (ref 5–13)
MPV: 11.6 FL (ref 8.9–12.9)
NEUTROPHILS: 63 % (ref 32–75)
NRBC: 0 PER 100 WBC
PLATELET: 244 10*3/uL (ref 150–400)
RBC: 4.91 M/uL (ref 4.10–5.70)
RDW: 13.6 % (ref 11.5–14.5)
WBC: 10.8 10*3/uL (ref 4.1–11.1)

## 2019-04-09 LAB — GLUCOSE, POC
Glucose (POC): 143 mg/dL — ABNORMAL HIGH (ref 65–100)
Glucose (POC): 150 mg/dL — ABNORMAL HIGH (ref 65–100)
Glucose (POC): 155 mg/dL — ABNORMAL HIGH (ref 65–100)
Glucose (POC): 147 mg/dL — ABNORMAL HIGH (ref 65–100)
Glucose (POC): 142 mg/dL — ABNORMAL HIGH (ref 65–100)

## 2019-04-09 LAB — PTT: aPTT: 27.1 s (ref 22.1–32.0)

## 2019-04-09 LAB — PROTHROMBIN TIME + INR
INR: 1 (ref 0.9–1.1)
Prothrombin time: 10.3 s (ref 9.0–11.1)

## 2019-04-09 LAB — D DIMER: D-dimer: 0.55 mg/L FEU (ref 0.00–0.65)

## 2019-04-09 MED ORDER — POTASSIUM BICARBONATE-CITRIC ACID 20 MEQ EFFERVESCENT TABLET
20 mEq | ORAL | Status: AC
Start: 2019-04-09 — End: 2019-04-09
  Administered 2019-04-09: 16:00:00 via ORAL

## 2019-04-09 MED ORDER — FOLIC ACID 1 MG TAB
1 mg | Freq: Every day | ORAL | Status: DC
Start: 2019-04-09 — End: 2019-04-18
  Administered 2019-04-09 – 2019-04-18 (×10): via ORAL

## 2019-04-09 MED ORDER — ATORVASTATIN 40 MG TAB
40 mg | Freq: Every evening | ORAL | Status: DC
Start: 2019-04-09 — End: 2019-04-18
  Administered 2019-04-10 – 2019-04-18 (×9): via ORAL

## 2019-04-09 MED ORDER — LORAZEPAM 2 MG/ML IJ SOLN
2 mg/mL | Freq: Once | INTRAMUSCULAR | Status: AC
Start: 2019-04-09 — End: 2019-04-08
  Administered 2019-04-09: 02:00:00 via INTRAVENOUS

## 2019-04-09 MED ORDER — INSULIN NPH HUMAN RECOMB 100 UNIT/ML INJECTION
100 unit/mL | Freq: Two times a day (BID) | SUBCUTANEOUS | Status: DC
Start: 2019-04-09 — End: 2019-04-11
  Administered 2019-04-10 – 2019-04-11 (×4): via SUBCUTANEOUS

## 2019-04-09 MED ORDER — LISINOPRIL 20 MG TAB
20 mg | Freq: Every day | ORAL | Status: DC
Start: 2019-04-09 — End: 2019-04-18
  Administered 2019-04-09 – 2019-04-18 (×10): via ORAL

## 2019-04-09 MED ORDER — CLONIDINE 0.2 MG TAB
0.2 mg | ORAL | Status: DC | PRN
Start: 2019-04-09 — End: 2019-04-09

## 2019-04-09 MED ORDER — THERAPEUTIC MULTIVITAMIN TAB
Freq: Every day | ORAL | Status: DC
Start: 2019-04-09 — End: 2019-04-18
  Administered 2019-04-09 – 2019-04-18 (×10): via ORAL

## 2019-04-09 MED ORDER — THIAMINE HCL 100 MG TAB
100 mg | Freq: Every day | ORAL | Status: DC
Start: 2019-04-09 — End: 2019-04-18
  Administered 2019-04-09 – 2019-04-18 (×10): via ORAL

## 2019-04-09 MED ORDER — LORAZEPAM 2 MG/ML IJ SOLN
2 mg/mL | INTRAMUSCULAR | Status: DC | PRN
Start: 2019-04-09 — End: 2019-04-15
  Administered 2019-04-09 – 2019-04-15 (×25): via INTRAVENOUS

## 2019-04-09 MED FILL — MORPHINE 2 MG/ML INJECTION: 2 mg/mL | INTRAMUSCULAR | Qty: 1

## 2019-04-09 MED FILL — FOLIC ACID 1 MG TAB: 1 mg | ORAL | Qty: 1

## 2019-04-09 MED FILL — CHILDREN'S ASPIRIN 81 MG CHEWABLE TABLET: 81 mg | ORAL | Qty: 1

## 2019-04-09 MED FILL — INSULIN NPH HUMAN RECOMB 100 UNIT/ML INJECTION: 100 unit/mL | SUBCUTANEOUS | Qty: 1

## 2019-04-09 MED FILL — THERAPEUTIC MULTIVITAMIN TAB: ORAL | Qty: 1

## 2019-04-09 MED FILL — NORMAL SALINE FLUSH 0.9 % INJECTION SYRINGE: INTRAMUSCULAR | Qty: 10

## 2019-04-09 MED FILL — INSULIN LISPRO 100 UNIT/ML INJECTION: 100 unit/mL | SUBCUTANEOUS | Qty: 1

## 2019-04-09 MED FILL — FAMOTIDINE 20 MG TAB: 20 mg | ORAL | Qty: 1

## 2019-04-09 MED FILL — LORAZEPAM 2 MG/ML IJ SOLN: 2 mg/mL | INTRAMUSCULAR | Qty: 1

## 2019-04-09 MED FILL — HEPARIN (PORCINE) 5,000 UNIT/ML IJ SOLN: 5000 unit/mL | INTRAMUSCULAR | Qty: 1

## 2019-04-09 MED FILL — HYDROXYZINE 25 MG TAB: 25 mg | ORAL | Qty: 1

## 2019-04-09 MED FILL — ATORVASTATIN 40 MG TAB: 40 mg | ORAL | Qty: 1

## 2019-04-09 MED FILL — SODIUM CHLORIDE 0.9 % IV: INTRAVENOUS | Qty: 1000

## 2019-04-09 MED FILL — LISINOPRIL 20 MG TAB: 20 mg | ORAL | Qty: 1

## 2019-04-09 MED FILL — EFFER-K 20 MEQ EFFERVESCENT TABLET: 20 mEq | ORAL | Qty: 2

## 2019-04-09 MED FILL — NICOTINE 14 MG/24 HR DAILY PATCH: 14 mg/24 hr | TRANSDERMAL | Qty: 1

## 2019-04-09 MED FILL — THIAMINE HCL 100 MG TAB: 100 mg | ORAL | Qty: 1

## 2019-04-09 NOTE — Progress Notes (Signed)
1716-TRANSFER - OUT REPORT:    Verbal report given to Sabrina RN  E(name) on Kreston Devito  being transferred to 5E(unit) for routine progression of care       Report consisted of patient???s Situation, Background, Assessment and   Recommendations(SBAR).     Information from the following report(s) SBAR, Intake/Output, MAR and Recent Results was reviewed with the receiving nurse.    Lines:   Peripheral IV 04/08/19 Right Antecubital (Active)   Site Assessment Clean, dry, & intact 04/09/2019  3:44 PM   Phlebitis Assessment 0 04/09/2019  3:44 PM   Infiltration Assessment 0 04/09/2019  3:44 PM   Dressing Status Clean, dry, & intact 04/09/2019  3:44 PM   Dressing Type Tape;Transparent 04/09/2019  3:44 PM   Hub Color/Line Status Blue;Capped 04/09/2019  3:44 PM   Action Taken Open ports on tubing capped 04/09/2019  3:44 PM   Alcohol Cap Used Yes 04/09/2019  3:44 PM        Opportunity for questions and clarification was provided.      Patient transported with:  Transport

## 2019-04-09 NOTE — Progress Notes (Signed)
Physical Therapy 04/09/2019    Orders received and chart reviewed up to date. Pt received in bed, RN bedside. Pt declined mobility at this time, agreed to re-attempt later/tomorrow as schedule permits. Will f/u as appropriate.     Recommend with nursing patient to complete as able in order to maintain strength, endurance and independence: OOB 3x/day.    Thank you.  Maggie Thornhill, PT, DPT

## 2019-04-09 NOTE — Other (Deleted)
Pt admitted with atypical chest pain and has history of afib documented in attending progress notes.  Noted documentation of PAF by ordered Cardiology consultant Zimmer NP 6/9.  If possible, please document in progress notes and discharge summary:    ? Paroxysmal afib confirmed  ? Other type of afib, please specify (longstanding/permanent/persistent, chronic)  ? Other, please specify _____________  ? Clinically unable to determine    The medical record reflects the following:      Risk Factors: 21 M with history of afib documented    Clinical Indicators: 6/9 EKG result Normal sinus rhythm   Right bundle branch block, patient documented to be in NSR in nursing vitals flow sheets, patient was on Plavix in 2019 but aggressive anticoagulation not recommended by cardiology    Treatment: admission to telemetry, cardiology consult, ASA 81 mg daily      Chronic: nonspecific term that could be referring to paroxysmal, persistent, or permanent   Longstanding persistent: persistent and continuous, lasting > 1 year.   Paroxysmal - self-terminating or intermittent; resolves with or without intervention within 7 days of onset; may recur with various frequency.   Persistent - Fails to terminate within 7 days; Often requires meds or cardioversion to restore to NSR.   Permanent - longstanding & persistent; Medication has been ineffective in restoring NSR &/or cardioversion is contraindicated    Definitions per MS-DRG Training Guide and Quick Reference Guide, [place] 5 Diseases and Disorders of the Circulatory System; 2019; 44M. Software content from the 44M??? Advanced [employer] Transformation Program

## 2019-04-09 NOTE — Progress Notes (Signed)
Hospitalist Progress Note      Hospital summary: Derek Mclaughlin is a 68 y.o. male who presents with PMH of DM, [name], HTN, [name], COPD here with c/o chest pain x1 week and worsening since last night with associated N/V and also having shortness of breath and cough.??He describes pain as tight heaviness to left side of chest and radiates up to neck and jaw. ??He is visiting from [place] and states he was supposed to living with friends when he got here but the house was vacant when he arrived. Has lived in motel until he ran out of money now homeless. He has been out of medications for about 1 week prior to coming to ED. He states he has a history of alcohol dependence and restarted drinking alcohol about 2 weeks prior to coming to ED and drinks 12 beers/day. He states he has a hx of bipolar and schizophrenia and has had an increase in symptoms of depression over last few months, he stated he doesn't care if he lives but denies suicidal and homicidal ideations.He states he is having auditory and visual hallucinations x2 weeks where he hears his deceased wifes voice and thinks he has seen her in a room with him.   Work up in ED: negative CXR, EKG, cardiology consult, ECHO ordered, labs reviewed, psych consult, r/o COVID. 04/07/2019      Assessment/Plan:  Atypical Chest Pain:   - likely due to anxiety  -MWN:UUVOZD sinus rhythm, Inferior infarct, age undetermined, RBBB  -troponin negative x 3 negative  -ECHO: EF 55 - 60%. No regional wall motion abnormality noted. Mild (grade 1) left ventricular diastolic dysfunction.  -cardiology on board  - cardiac cath 6/9: Normal LVEDP.     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 Takes Xanax  tid last filled in May 20th 90 pills but patientstates he lost his medication while travelling here. Last dose was on Thursday 04/03/2019  - IV ativan prn     Severe anxiety/ Panic attacks   C/w Hallucinations: hx of Bipolar and schizophrenia.    - Auditory and visual hallucinations of deceased wife.   -denies HI and SI, not on any mood medications except for Xanax  -will wait for psych recommendations    - hydroxyzine prn     DM type II with hyperglycemia  - pt states he takes insulin Humalog 50 U with breakfast, 30U with lunch and 50U with dinener  - HGA1c 9  - started on NPH 5U bid. C/w ISS    [name]:   -lipid panel LDL 123  - started on statin  ????  HTN: BP elevated  - not on any meds per pharmacy  - started on lisinopril  daily    -prn hydralazine    COPD not in exacerbation  -CXR negative  -guaifenesin as needed for cough, albuterol inhaler as needed  - COVID 19 negative ??  - recommend pulmonology and PFT eval as outpt      ETOH Dependence:   - drinks 12 beers/day.  -CIWA protocol  ??  Tobacco Dependence: current smoker 2ppd-1/2ppd.  -not interested in quitting, counseled for cessation  -nicotine patch    Fall 6/10  - CT head: No significant intracranial abnormalities  - CT neck: Avulsion fracture of the tip of the T1 spinous process, probably chronic  - NSGY consult placed   ??  Homelessness  -case management consult for resources  ??  Code status: Full  DVT prophylaxis: heparin   Disposition:  TBD  ----------------------------------------------    CC: Chest pain    S: Patient is seen and examined at bedside. He c/w nausea, vomiting and diarrhea this AM. He rushed to go to washroom. Then he was unable to move after that and had a fall. RRT called. Stat CT head and neck were done. He also c/o pain all over  - joint pains, back pain.    Called his pharmacy [facility] at [facility] @ [phone] ( number provided by patient) - patient was taking only 2 medications xanax and Humalog. He had percocet x 5 days in feb 2020 ( pt stated e atkes percocet 10-325mg  tid)    Review of Systems:  As above     O:  Visit Vitals  BP 147/88   Pulse 81   Temp 98.2 ??F (36.8 ??C)   Resp 18   Ht 5\' 10"  (1.778 m)   Wt 116.9 kg (257 lb 11.2 oz)   SpO2 96%   BMI 36.98 kg/m??        PHYSICAL EXAM:  Gen: NAD, obese   HEENT: anicteric sclerae, normal conjunctiva, oropharynx clear, MM moist  Neck: supple, trachea midline, no adenopathy  Heart: RRR, no MRG, no JVD, no peripheral edema  Lungs: CTA b/l, non-labored respirations  Abd: soft, NT, ND, BS+, no organomegaly  Extr: warm  Skin: dry, no rash  Neuro: CN II-XII grossly intact, normal speech, moves all extremities  Psych:  Anxious       Intake/Output Summary (Last 24 hours) at 04/09/2019 1142  Last data filed at 04/09/2019 9735  Gross per 24 hour   Intake 120 ml   Output 1500 ml   Net -1380 ml        Recent labs & imaging reviewed:  Recent Results (from the past 24 hour(s))   GLUCOSE, POC    Collection Time: 04/08/19  5:30 PM   Result Value Ref Range    Glucose (POC) 212 (H) 65 - 100 mg/dL    Performed by Gerald Leitz K    GLUCOSE, POC    Collection Time: 04/08/19  9:51 PM   Result Value Ref Range    Glucose (POC) 143 (H) 65 - 100 mg/dL    Performed by Gerald Leitz K    PROTHROMBIN TIME + INR    Collection Time: 04/09/19  1:11 AM   Result Value Ref Range    INR 1.0 0.9 - 1.1      Prothrombin time 10.3 9.0 - 11.1 sec   PTT    Collection Time: 04/09/19  1:11 AM   Result Value Ref Range    aPTT 27.1 22.1 - 32.0 sec    aPTT, therapeutic range     58.0 - 77.0 SECS   FIBRINOGEN    Collection Time: 04/09/19  1:11 AM   Result Value Ref Range    Fibrinogen 596 (H) 200 - 475 mg/dL   D DIMER    Collection Time: 04/09/19  1:11 AM   Result Value Ref Range    D-dimer 0.55 0.00 - 0.65 mg/L FEU   CBC WITH AUTOMATED DIFF    Collection Time: 04/09/19  1:11 AM   Result Value Ref Range    WBC 10.8 4.1 - 11.1 K/uL    RBC 4.91 4.10 - 5.70 M/uL    HGB 14.1 12.1 - 17.0 g/dL    HCT 43.2 36.6 - 50.3 %    MCV 88.0 80.0 - 99.0 FL    MCH 28.7  26.0 - 34.0 PG    MCHC 32.6 30.0 - 36.5 g/dL    RDW 16.113.6 09.611.5 - 04.514.5 %    PLATELET 244 150 - 400 K/uL    MPV 11.6 8.9 - 12.9 FL    NRBC 0.0 0 PER 100 WBC    ABSOLUTE NRBC 0.00 0.00 - 0.01 K/uL    NEUTROPHILS 63 32 - 75 %     LYMPHOCYTES 26 12 - 49 %    MONOCYTES 7 5 - 13 %    EOSINOPHILS 2 0 - 7 %    BASOPHILS 1 0 - 1 %    IMMATURE GRANULOCYTES 1 (H) 0.0 - 0.5 %    ABS. NEUTROPHILS 6.9 1.8 - 8.0 K/UL    ABS. LYMPHOCYTES 2.8 0.8 - 3.5 K/UL    ABS. MONOCYTES 0.8 0.0 - 1.0 K/UL    ABS. EOSINOPHILS 0.2 0.0 - 0.4 K/UL    ABS. BASOPHILS 0.1 0.0 - 0.1 K/UL    ABS. IMM. GRANS. 0.1 (H) 0.00 - 0.04 K/UL    DF AUTOMATED     METABOLIC PANEL, BASIC    Collection Time: 04/09/19  1:11 AM   Result Value Ref Range    Sodium 138 136 - 145 mmol/L    Potassium 3.4 (L) 3.5 - 5.1 mmol/L    Chloride 105 97 - 108 mmol/L    CO2 23 21 - 32 mmol/L    Anion gap 10 5 - 15 mmol/L    Glucose 140 (H) 65 - 100 mg/dL    BUN 16 6 - 20 MG/DL    Creatinine 4.090.98 8.110.70 - 1.30 MG/DL    BUN/Creatinine ratio 16 12 - 20      GFR est AA >60 >60 ml/min/1.4973m2    GFR est non-AA >60 >60 ml/min/1.7773m2    Calcium 8.2 (L) 8.5 - 10.1 MG/DL   GLUCOSE, POC    Collection Time: 04/09/19  7:50 AM   Result Value Ref Range    Glucose (POC) 150 (H) 65 - 100 mg/dL    Performed by Davis GourdBock Jacob    GLUCOSE, POC    Collection Time: 04/09/19  9:07 AM   Result Value Ref Range    Glucose (POC) 155 (H) 65 - 100 mg/dL    Performed by Clide CliffGowan Billy    GLUCOSE, POC    Collection Time: 04/09/19 11:10 AM   Result Value Ref Range    Glucose (POC) 147 (H) 65 - 100 mg/dL    Performed by Austin Mileshomas Rebecca      Recent Labs     04/09/19  0111 04/08/19  0029   WBC 10.8 11.0   HGB 14.1 13.8   HCT 43.2 42.5   PLT 244 277     Recent Labs     04/09/19  0111 04/08/19  0029 04/07/19  1112   NA 138 137 137   K 3.4* 4.4 3.8   CL 105 103 105   CO2 23 25 25    BUN 16 19 15    CREA 0.98 1.23 1.09   GLU 140* 193* 225*   CA 8.2* 8.5 8.6   MG  --   --  1.7     Recent Labs     04/08/19  0029 04/07/19  1112   ALT 15 14   AP 76 74   TBILI 0.3 0.5   TP 6.7 6.7   ALB 2.8* 2.9*   GLOB 3.9 3.8   LPSE  --  124  Recent Labs     04/09/19  0111 04/08/19  0029 04/07/19  1649   INR 1.0 1.0 1.0   PTP 10.3 10.4 10.3   APTT 27.1 26.8 26.8       Recent Labs     04/07/19  1649   FERR 131      No results found for: FOL, RBCF   No results for input(s): PH, PCO2, PO2 in the last 72 hours.  Recent Labs     04/08/19  0202 04/07/19  1649 04/07/19  1112   TROIQ <0.05 <0.05 <0.05     Lab Results   Component Value Date/Time    Cholesterol, total 201 (H) 04/08/2019 12:29 AM    HDL Cholesterol 36 04/08/2019 12:29 AM    LDL, calculated 123.2 (H) 16/10/960406/07/2019 12:29 AM    Triglyceride 209 (H) 04/08/2019 12:29 AM    CHOL/HDL Ratio 5.6 (H) 04/08/2019 12:29 AM     Lab Results   Component Value Date/Time    Glucose (POC) 147 (H) 04/09/2019 11:10 AM    Glucose (POC) 155 (H) 04/09/2019 09:07 AM    Glucose (POC) 150 (H) 04/09/2019 07:50 AM    Glucose (POC) 143 (H) 04/08/2019 09:51 PM    Glucose (POC) 212 (H) 04/08/2019 05:30 PM     Lab Results   Component Value Date/Time    Color YELLOW/STRAW 04/08/2019 02:07 AM    Appearance CLEAR 04/08/2019 02:07 AM    Specific gravity 1.019 04/08/2019 02:07 AM    pH (UA) 5.0 04/08/2019 02:07 AM    Protein 100 (A) 04/08/2019 02:07 AM    Glucose Negative 04/08/2019 02:07 AM    Ketone Negative 04/08/2019 02:07 AM    Bilirubin Negative 04/08/2019 02:07 AM    Urobilinogen 0.2 04/08/2019 02:07 AM    Nitrites Negative 04/08/2019 02:07 AM    Leukocyte Esterase Negative 04/08/2019 02:07 AM    Epithelial cells FEW 04/08/2019 02:07 AM    Bacteria Negative 04/08/2019 02:07 AM    WBC 0-4 04/08/2019 02:07 AM    RBC 50-100 04/08/2019 02:07 AM       Med list reviewed  Current Facility-Administered Medications   Medication Dose Route Frequency   ??? thiamine HCL (B-1) tablet 100 mg  100 mg Oral DAILY   ??? folic acid (FOLVITE) tablet 1 mg  1 mg Oral DAILY   ??? therapeutic multivitamin (THERAGRAN) tablet 1 Tab  1 Tab Oral DAILY   ??? LORazepam (ATIVAN) injection 1 mg  1 mg IntraVENous Q4H PRN   ??? cloNIDine HCL (CATAPRES) tablet 0.2 mg  0.2 mg Oral Q4H PRN   ??? insulin NPH (NOVOLIN N, HUMULIN N) injection 5 Units  5 Units SubCUTAneous ACB/HS    ??? sodium chloride (NS) flush 5-40 mL  5-40 mL IntraVENous Q8H   ??? sodium chloride (NS) flush 5-40 mL  5-40 mL IntraVENous PRN   ??? famotidine (PEPCID) tablet 20 mg  20 mg Oral BID   ??? morphine injection 0.5 mg  0.5 mg IntraVENous Q6H PRN   ??? hydrOXYzine HCL (ATARAX) tablet 25 mg  25 mg Oral TID PRN   ??? atorvastatin (LIPITOR) tablet 40 mg  40 mg Oral QHS   ??? sodium chloride (NS) flush 5-40 mL  5-40 mL IntraVENous Q8H   ??? sodium chloride (NS) flush 5-40 mL  5-40 mL IntraVENous PRN   ??? heparin (porcine) injection 5,000 Units  5,000 Units SubCUTAneous Q8H   ??? prochlorperazine (COMPAZINE) tablet 10 mg  10 mg Oral Q6H PRN   ???  glucose chewable tablet 16 g  4 Tab Oral PRN   ??? glucagon (GLUCAGEN) injection 1 mg  1 mg IntraMUSCular PRN   ??? insulin lispro (HUMALOG) injection   SubCUTAneous AC&HS   ??? acetaminophen (TYLENOL) tablet 650 mg  650 mg Oral Q4H PRN   ??? hydrALAZINE (APRESOLINE) 20 mg/mL injection 20 mg  20 mg IntraVENous Q6H PRN   ??? guaiFENesin (ROBITUSSIN) 100 mg/5 mL oral liquid 100 mg  100 mg Oral Q4H PRN   ??? ondansetron (ZOFRAN) injection 4 mg  4 mg IntraVENous Q4H PRN   ??? nicotine (NICODERM CQ) 14 mg/24 hr patch 1 Patch  1 Patch TransDERmal DAILY   ??? albuterol (PROVENTIL HFA, VENTOLIN HFA, PROAIR HFA) inhaler 2 Puff  2 Puff Inhalation Q4H PRN   ??? aspirin chewable tablet 81 mg  81 mg Oral DAILY       Care Plan discussed with:  Patient/Family and Nurse    Ronnie DerbySushma Clester Chlebowski, MD  Internal Medicine  Date of Service: 04/09/2019

## 2019-04-09 NOTE — Progress Notes (Addendum)
Problem: Diabetes Self-Management  Goal: *Using medications safely  Description: State effect of diabetes medications on diabetes; name diabetes medication taking, action and side effects.  Outcome: Progressing Towards Goal  Note: Patient on ACHS blood sugar checks and has insulin coverage available if needed     Problem: Falls - Risk of  Goal: *Absence of Falls  Description: Document Schmid Fall Risk and appropriate interventions in the flowsheet.  Outcome: Progressing Towards Goal  Note: Fall Risk Interventions:            Medication Interventions: Evaluate medications/consider consulting pharmacy, Patient to call before getting OOB, Teach patient to arise slowly                   Problem: Pain  Goal: *Control of Pain  Outcome: Progressing Towards Goal  Note: Patients pain being controlled with prn morphine and rest     Bedside and Verbal shift change report given to Shavonne, RN (oncoming nurse) by Hannah, RN (offgoing nurse). Report included the following information SBAR, Kardex, Intake/Output, MAR, Recent Results, Cardiac Rhythm NSR and Quality Measures.

## 2019-04-09 NOTE — Progress Notes (Signed)
Progress Note    Patient: Derek PummelRobert Mclaughlin MRN: [id number]  SSN: [id number]    Date of Birth: [birth year]  Age: 68 y.o.  Sex: male      Admit Date: 04/07/2019    LOS: 1 day     Subjective:     Derek Mclaughlin is a 68 yo WM with bipolar disorder/schizophrenia, PAF, ETOH/Tob abuse admitted off medications with chest pain and shortness of breath. Cardiac catheterization performed with reported history of multiple stents per patient and prior myocardial infarction. Cardiac cath with moderate LAD lesion for medical therapy.     He is tearful this morning. Complains of pain and "nerves shot" all over. Fell in room returning from bathroom. States morphine and ativan in ER helped a lot, and why can he not get these now.     Objective:     Vitals:    04/09/19 0903 04/09/19 0931 04/09/19 1110 04/09/19 1222   BP: (!) 174/94 155/84 147/88 153/84   Pulse: 98 85 81 96   Resp: 16 18 18 18    Temp: 97.2 ??F (36.2 ??C) 97.8 ??F (36.6 ??C) 98.2 ??F (36.8 ??C) 98.1 ??F (36.7 ??C)   SpO2: 95% 94% 96% 96%   Weight:       Height:          Temp (24hrs), Avg:98 ??F (36.7 ??C), Min:97.2 ??F (36.2 ??C), Max:98.8 ??F (37.1 ??C)      Intake and Output:  Current Shift: 06/10 0701 - 06/10 1900  In: -   Out: 1175 [Urine:1175]  Last three shifts: 06/08 1901 - 06/10 0700  In: 120 [P.O.:120]  Out: 1000 [Urine:1000]    Physical Exam:  General:  Alert, cooperative, well nourished, well developed, appears stated age, overweight   Eyes:  Conjunctivae/corneas clear    Nose: Nares normal. Septum midline. Mucosa normal. No drainage or sinus tenderness.   Neck: Supple, symmetrical, trachea midline, no JVD   Lungs:   Clear to auscultation bilaterally, good effort   Heart:  Regular rate and rhythm, no murmur, click, rub, or gallop   Abdomen:   Soft, non-tender, bowel sounds normal, non-distended   Extremities: Normal, atraumatic, no cyanosis or edema   Skin: Skin color, texture, turgor normal. No rash or lesions.   Neurologic: Non focal, anxious        Current Facility-Administered Medications:   ???  thiamine HCL (B-1) tablet 100 mg, 100 mg, Oral, DAILY, Madala, Sushma, MD, 100 mg at 04/09/19 1131  ???  folic acid (FOLVITE) tablet 1 mg, 1 mg, Oral, DAILY, Madala, Sushma, MD, 1 mg at 04/09/19 1131  ???  therapeutic multivitamin (THERAGRAN) tablet 1 Tab, 1 Tab, Oral, DAILY, Madala, Sushma, MD, 1 Tab at 04/09/19 1131  ???  LORazepam (ATIVAN) injection 1 mg, 1 mg, IntraVENous, Q4H PRN, Ronnie DerbyMadala, Sushma, MD  ???  insulin NPH (NOVOLIN N, HUMULIN N) injection 5 Units, 5 Units, SubCUTAneous, ACB/HS, Madala, Sushma, MD  ???  lisinopriL (PRINIVIL, ZESTRIL) tablet 20 mg, 20 mg, Oral, DAILY, Madala, Sushma, MD, 20 mg at 04/09/19 1225  ???  sodium chloride (NS) flush 5-40 mL, 5-40 mL, IntraVENous, Q8H, Kaushik, Manu, MD, 10 mL at 04/09/19 0718  ???  sodium chloride (NS) flush 5-40 mL, 5-40 mL, IntraVENous, PRN, Ricki RodriguezKaushik, Manu, MD  ???  famotidine (PEPCID) tablet 20 mg, 20 mg, Oral, BID, Madala, Sushma, MD, 20 mg at 04/09/19 1131  ???  morphine injection 0.5 mg, 0.5 mg, IntraVENous, Q6H PRN, Madala, Sushma, MD, 0.5 mg at 04/09/19 1253  ???  hydrOXYzine HCL (ATARAX) tablet 25 mg, 25 mg, Oral, TID PRN, Cheryll Cockayne, MD, 25 mg at 04/09/19 1226  ???  atorvastatin (LIPITOR) tablet 40 mg, 40 mg, Oral, QHS, Madala, Sushma, MD  ???  sodium chloride (NS) flush 5-40 mL, 5-40 mL, IntraVENous, Q8H, Royal, Rosla, NP, 10 mL at 04/09/19 0718  ???  sodium chloride (NS) flush 5-40 mL, 5-40 mL, IntraVENous, PRN, Royal, Rosla, NP  ???  heparin (porcine) injection 5,000 Units, 5,000 Units, SubCUTAneous, Q8H, Royal, Rosla, NP, 5,000 Units at 04/09/19 1132  ???  prochlorperazine (COMPAZINE) tablet 10 mg, 10 mg, Oral, Q6H PRN, Royal, Rosla, NP  ???  glucose chewable tablet 16 g, 4 Tab, Oral, PRN, Royal, Rosla, NP  ???  glucagon (GLUCAGEN) injection 1 mg, 1 mg, IntraMUSCular, PRN, Royal, Rosla, NP  ???  insulin lispro (HUMALOG) injection, , SubCUTAneous, AC&HS, Royal, Rosla, NP, 2 Units at 04/09/19 1211   ???  acetaminophen (TYLENOL) tablet 650 mg, 650 mg, Oral, Q4H PRN, Royal, Rosla, NP  ???  hydrALAZINE (APRESOLINE) 20 mg/mL injection 20 mg, 20 mg, IntraVENous, Q6H PRN, Royal, Rosla, NP, 20 mg at 04/08/19 2001  ???  guaiFENesin (ROBITUSSIN) 100 mg/5 mL oral liquid 100 mg, 100 mg, Oral, Q4H PRN, Royal, Rosla, NP  ???  ondansetron (ZOFRAN) injection 4 mg, 4 mg, IntraVENous, Q4H PRN, Royal, Rosla, NP  ???  nicotine (NICODERM CQ) 14 mg/24 hr patch 1 Patch, 1 Patch, TransDERmal, DAILY, Royal, Rosla, NP, 1 Patch at 04/09/19 1130  ???  albuterol (PROVENTIL HFA, VENTOLIN HFA, PROAIR HFA) inhaler 2 Puff, 2 Puff, Inhalation, Q4H PRN, Royal, Rosla, NP  ???  aspirin chewable tablet 81 mg, 81 mg, Oral, DAILY, Royal, Rosla, NP, 81 mg at 04/09/19 1131    Lab/Data Review:  Labs Latest Ref Rng & Units 04/09/2019 04/08/2019 04/07/2019   WBC 4.1 - 11.1 K/uL 10.8 11.0 11.5(H)   RBC 4.10 - 5.70 M/uL 4.91 4.77 5.03   Hemoglobin 12.1 - 17.0 g/dL 14.1 13.8 14.6   Hematocrit 36.6 - 50.3 % 43.2 42.5 44.6   MCV 80.0 - 99.0 FL 88.0 89.1 88.7   Platelets 150 - 400 K/uL 244 277 228   Lymphocytes 12 - 49 % 26 26 14    Monocytes 5 - 13 % 7 7 6    Eosinophils 0 - 7 % 2 2 2    Basophils 0 - 1 % 1 1 0   Albumin 3.5 - 5.0 g/dL - 2.8(L) 2.9(L)   Calcium 8.5 - 10.1 MG/DL 8.2(L) 8.5 8.6   Glucose 65 - 100 mg/dL 140(H) 193(H) 225(H)   BUN 6 - 20 MG/DL 16 19 15    Creatinine 0.70 - 1.30 MG/DL 0.98 1.23 1.09   Sodium 136 - 145 mmol/L 138 137 137   Potassium 3.5 - 5.1 mmol/L 3.4(L) 4.4 3.8   LDH 85 - 241 U/L - - 176       04/07/19   ECHO ADULT COMPLETE 04/08/2019 04/08/2019    Narrative ?? Image quality for this study was suboptimal.  ?? Contrast used: DEFINITY.  ?? Normal cavity size, wall thickness and systolic function (ejection   fraction normal). Estimated left ventricular ejection fraction is 55 -   60%. No regional wall motion abnormality noted. Mild (grade 1) left   ventricular diastolic dysfunction.  ?? Mildly dilated right atrium.   ?? Trace mitral valve regurgitation is present.        Signed by: Christy Sartorius, MD       04/07/19   CARDIAC  PROCEDURE 04/08/2019 04/08/2019    Narrative Findings:  1)Normal LVEDP  2)Angiographically moderate to severe disease in mid LAD(70%) not   functionally significant based on iFR(0.93).  3)Mild disease elsewhere    Recommendations  1)Consider alternative etiology for chest pain  2)GDT for primary prevention of [name] events    Access: Right radial--no issues    Contrast 50 cc       Signed by: Ricki RodriguezKaushik, Manu, MD             Assessment:     Active Problems:    Chest pain (04/07/2019)      Schizophrenia (HCC) (04/09/2019)      Bipolar 1 disorder, depressed, severe (HCC) (04/09/2019)      Tobacco abuse (04/09/2019)      Alcohol abuse (04/09/2019)        Plan:     Derek Mclaughlin is a 68 yo WM with bipolar disorder, schizophrenia and chest pain. He has moderate [name] with preserved LV systolic function. Medical therapy is warranted, particularly in light of social issues and medical non-compliance.  Agree with aspirin and statin. Antihypertensives as indicated.    Needs Psychiatric and medical attention. Pain control per medical team.    Thank you for asking us to see Derek Mclaughlin. We will be available if needed further.       Signed By: Veneda MelterNavin Humphrey Guerreiro, MD     April 09, 2019      Interventional Cardiology  [facility]  [address], [address]  [place], [place] [id number]  P: [phone]  F: [phone]

## 2019-04-09 NOTE — Other (Signed)
CLINICAL NURSE SPECIALIST CONSULT  [facility]    FOLLOW UP  NOTE    Presentation   Derek Mclaughlin is a 68 y.o. male admitted with chest pain for 1 week accompanied by N/V and SOB with cough . Current clinical course has been complicated by recent bout of increased depression without SI/ or homicidal along  with hallucinations. He is visiting here from [place] with the intention of moving in with 'friends' but when he arrived the 'home' was vacant.  He lived in a hotel until money ran out, and now is homeless. He also has ran out of his medications.      PMH: DM2/ [name]/ HTN/ [name]/ COPD/ ETOH abuse (12 beers daily)/ + cigarette smoker/bipoloar and schizophrenia.    Recent Events: Patient had a fall this morning and stated "he hit his head" RRT called.  Will go to CT for head scan. On CIWA protocol.    Diabetes: Patient has known uncontrolled  Type 2 diabetes- A1C 9.1% , treated with Metformin and Lispro insulin  PTA. Family history unknown for diabetes.     Consulted by Provider for advanced diabetes nursing assessment and care, specifically related to     '[]'$  Transitioning off Glucostabilizer   '[x]'$  Inpatient management strategy  '[x]'$  Home management assessment  '[]'$  Survival skill education    Diabetes-related medical history  Acute complications  hyperglycemia  Neurological complications  TBD  Microvascular disease  TBD  Macrovascular disease  [name]  Other associated conditions     Depression / bipolor/ schizophrenia/HTN/ COPD/ ETOH    Diabetes medication history  Drug class Currently in use Discontinued Never used   Biguanide Metformin '1000mg'$  daily     DDP-4 inhibitor       Sulfonylurea      Thiazolidinedione      GLP-1 RA      SGLT-2 inhibitors      Basal insulin None listed on PTA?     Fixed Dose  Combinations      Bolus insulin Lispro TID with meals;  40units with breakfast and lunch  45units with dinner       Subjective    Derek Mclaughlin fell this morning and is headed down to [place].         Patient reports the following home diabetes self-care practices:  Eating pattern  Physical activity pattern  '[]'$   Monitoring pattern-checked it off and on- 140-390s.  Lost his glucometer, will need a new one.    Taking medications pattern  '[x]'$  In-Consistent administration??  '[x]'$  Affordable    Social determinants of health impacting diabetes self-management practices   Worried that housing situation is unstable, Worried that your food supply will run out before you have money to buy more and Struggling with anxiety and/or depression    Objective   Physical exam  General   Vital Signs   Visit Vitals  BP 155/84   Pulse 85   Temp 97.8 ??F (36.6 ??C)   Resp 18   Ht '5\' 10"'$  (1.778 m)   Wt 116.9 kg (257 lb 11.2 oz)   SpO2 94%   BMI 36.98 kg/m??     Skin  Warm and dry. Acanthosis noted along neckline.   Heart   Regular rate and rhythm. No murmurs, rubs or gallops  Lungs  Clear to auscultation without rales or rhonchi  Extremities No foot wounds    Diabetic foot exam: Vibratory and Filament test: deferred due to clinical condition-patient off  floor    Left Foot       Right Foot       Laboratory  Lab Results   Component Value Date/Time    Hemoglobin A1c 9.1 (H) 04/07/2019 04:49 PM     Lab Results   Component Value Date/Time    LDL, calculated 123.2 (H) 04/08/2019 12:29 AM     Lab Results   Component Value Date/Time    Creatinine 0.98 04/09/2019 01:11 AM     Lab Results   Component Value Date/Time    Sodium 138 04/09/2019 01:11 AM    Potassium 3.4 (L) 04/09/2019 01:11 AM    Chloride 105 04/09/2019 01:11 AM    CO2 23 04/09/2019 01:11 AM    Anion gap 10 04/09/2019 01:11 AM    Glucose 140 (H) 04/09/2019 01:11 AM    BUN 16 04/09/2019 01:11 AM    Creatinine 0.98 04/09/2019 01:11 AM    BUN/Creatinine ratio 16 04/09/2019 01:11 AM    GFR est AA >60 04/09/2019 01:11 AM    GFR est non-AA >60 04/09/2019 01:11 AM    Calcium 8.2 (L) 04/09/2019 01:11 AM    Bilirubin, total 0.3 04/08/2019 12:29 AM    Alk. phosphatase 76 04/08/2019 12:29 AM     Protein, total 6.7 04/08/2019 12:29 AM    Albumin 2.8 (L) 04/08/2019 12:29 AM    Globulin 3.9 04/08/2019 12:29 AM    A-G Ratio 0.7 (L) 04/08/2019 12:29 AM    ALT (SGPT) 15 04/08/2019 12:29 AM     Lab Results   Component Value Date/Time    ALT (SGPT) 15 04/08/2019 12:29 AM       Factors affecting BG pattern  Factor Dose Comments   Nutrition:  Carb-controlled meals     60 grams/meal      Drugs:    Other: ETOH   12 beers daily      Blood glucose pattern      Assessment and Plan   Nursing Diagnosis Risk for unstable blood glucose pattern   Nursing Intervention Domain 5250 Decision-making Support   Nursing Interventions Examined current inpatient diabetes control   Explored factors facilitating and impeding inpatient management  Identified self-management practices impeding diabetes control  Explored corrective strategies with patient and responsible inpatient provider   Informed patient of rational for insulin strategy while hospitalized     Evaluation   Derek Mclaughlin, with uncontrolledType 2 diabetes- A1C 9.1% , has  achieved inpatient blood glucose target of 100-'180mg'$ /dl.  AM BG '150mg'$ /dl today.  BG trended down nicely overnight with sliding scale.   It will be appropriate to INITIATE insulin subcutaneous protocol  When BG trends>'200mg'$ /dl.   Inpatient blood glucose management has been impacted by  '[]'$  Kidney dysfunction  '[x]'$  Erratic meal consumption /NPO  '[]'$  Glucocorticoid use  '[x]'$  chest pain      Recommendations   1.  Once BG trends >'200mg'$ /dl; consider INITIATING insulin subcutaneous order set [facility]):     Basal insulin   '[x]'$  0.2 units/kg/D=  Start 20units daily;     Daily evaluation fasting blood glucose will be required to determine the appropriate amount of Lantus. If fasting blood glucose is over 200, please add the total amount of correctional Humalog received the day before to total Lantus dose.  Ok to split Lantus BID for larger doses.    2.  ADD mealtime Bolus insulin (if BG remains >'200mg'$ /dl despite basal  insulin)  '[x]'$  Normal sensitivity=6 units TID with meals  3.  CONTINUE Corrective insulin  '[x]'$  Normal sensitivity    4. Modify diet to diabetic carb consistent.       Referral   '[x]'$  Behavioral health services (consulted)  '[]'$  Inpatient nutrition services  '[]'$  Pharmacy services for medication management  '[]'$  Diabetes Self-Management Training through [facility] (Phone [phone] to schedule appointment)    Billing Code(s)   Thank you for including [place] in their care.  I spent 20 minutes in direct patient care today for this patient.  Time includes chart review, face to face with patient and collaboration with interdisciplinary care team.      Lily Lovings, CNS  [facility]  Access via [place] (C) [phone]

## 2019-04-09 NOTE — Progress Notes (Signed)
Spiritual Care Assessment/Progress Note  [name]      NAME: Derek Mclaughlin      MRN: [id number]  AGE: 68 y.o. SEX: male  Religious Affiliation: [employer]   Language: English     04/09/2019     Total Time (in minutes): 6     Spiritual Assessment begun in [facility] IMCU through conversation with:         [] Patient        []  Family    []  Friend(s)        Reason for Consult: Rapid response team     Spiritual beliefs: (Please include comment if needed)     []  Identifies with a faith tradition:         []  Supported by a faith community:            []  Claims no spiritual orientation:           []  Seeking spiritual identity:                []  Adheres to an individual form of spirituality:           [x]  Not able to assess:                           Identified resources for coping:      []  Prayer                               []  Music                  []  Guided Imagery     []  Family/friends                 []  Pet visits     []  Devotional reading                         []  Unknown     []  Other:                                           Interventions offered during this visit: (See comments for more details)    Patient Interventions: Crisis           Plan of Care:     []  Support spiritual and/or cultural needs    []  Support AMD and/or advance care planning process      []  Support grieving process   []  Coordinate Rites and/or Rituals    []  Coordination with community clergy   []  No spiritual needs identified at this time   []  Detailed Plan of Care below (See Comments)  []  Make referral to Music Therapy  []  Make referral to Pet Therapy     []  Make referral to Addiction services  []  Make referral to [place]  []  Make referral to Spiritual Care Partner  []  No future visits requested        []  Follow up visits as needed     Comments: Responded to RRT call for patient.  Patient had fallen and staff was assessing and caring for patient.  Spiritual care is available to  provide support to patient as needed/desired.  [phone].     Visit by: Rev. Damarrion Mimbs L.  Sissy Hoff, D.Min, MA, [facility]    [employer] banker, operational

## 2019-04-09 NOTE — Other (Deleted)
Patient admitted with atypical chest pain noted to have COPD and  presents with cough and shortness of breath.  If possible, please document in progress notes and d/c summary if you are evaluating and /or treating any of the following:    ? COPD exacerbation  ? Chronic obstructive asthma with or without status asthmaticus (asthma with COPD)  ? Chronic obstructive bronchitis  ? Emphysema  ? Other (please specify)  ? Unable to determine    The medical record reflects the following:    Risk Factors: 62 M hx/o COPD without meds for several days    Clinical Indicators: patient reports SOB, cough, RR 17-24 and O2 saturations 92-98% on RA, 6/8 CXR result "lungs are clear bilaterally", Royal NP H&P "Clear to auscultation bilaterally. Positive for??cough??and shortness of breath." Zimmer NP cardiology consultant 6/9 "Lungs: +SOB with exertion, + cough", Madala 6/9 Progress Note "COPD: current cough and SOB"  -    Treatment: albuterol 2 puff q 4hr prn, robitussin 100 mg oral q 4 hr prn, smoking cessation counseling, Nicoderm patch, monitor O2 sats for goal >92%, consider maintence meds on discharge

## 2019-04-09 NOTE — Progress Notes (Signed)
Post Fall Documentation      Derek Mclaughlin witnessed/unwitnessed fall occurred on 04/09/19 (Date) at 0900 (Time).     The answers to the following questions summarize the fall:     In the patient's own words,:  ?? What were you attempting to do when you fell?  Going to the bathrrom   ?? Do you know why you fell? No   ?? Do you have any pain/discomfort or any other complaints?  Back pain  ?? Which part of your body made contact with the floor or other object?  Arms and head     Nurse:  ? Was this an assisted fall? no  ? Was fall witnessed? No  ? If witnessed, what part of the body made contact with the floor or other object?  n/a  ? Patient???s mental status after the fall/when found: Confused  ? Any apparent injury:  No apparent injury  ? Immediate interventions for injury/suspected injury? No interventions needed  ? Patient assisted back to bed? Mechanical lift and Mobility team  ? Name of provider notified and time, any comments? Madala 04/09/19 0900       ? Name of family member notified and time: n/a      Immediate VS and physical assessment documented in flow sheets.    Neuro assessment every hour x 4 (for potential head injury or unwitnessed fall) documented in flow sheets.      Elberta Leatherwood, RN

## 2019-04-09 NOTE — Consults (Signed)
[place]    Name:  ROSENDO, COUSER  MR#:  [id number]  DOB:  Jan 04, 1951  ACCOUNT #:  [id number]  DATE OF SERVICE:  04/09/2019    BEHAVIORAL HEALTH CONSULTATION    REASON FOR CONSULTATION:  History of bipolar disorder, schizophrenia, hallucination.    HISTORY OF PRESENT ILLNESS:  The patient is a 68 year old male who is currently being seen on the medical unit for psychiatric consultation for complaints mentioned above.  His admission to the medical unit is well documented in his chart.  His past medical history is significant for DM, [name], hypertension, [name], COPD.  He presented to the emergency room with chest pain.  He had cardiac cath done.  He also has a history of alcohol dependence, and lately, he was drinking 12 beers per day for about two weeks.  He is visiting from [place], and he was supposed to be living with a friend, but when he got there, the house was vacant and he then started living in the motel until he ran out of money, and now he is homeless.  He tells me that he has history of bipolar disorder and schizophrenia and that the only medication that has worked for both of these diagnoses is Xanax.  He stated he was previously on Xanax 2 mg three times a day for two years, and he would like that to be restarted.  He states prior to Xanax, he was on Klonopin and then Valium.  He also fell this morning and states that he is in so much pain.  He is labile during the interview.  He denies withdrawal symptoms at this juncture, CIWA is 0.  According to the note, he was having auditory and visual hallucinations for two weeks.  He hears and sees his dead wife.  When I asked him about this, he said that he was not hallucinating.  He only saw his wife and heard her only one time.  He states that he is in so much pain and needs an IV Ativan and IV morphine, and he is frustrated why he cannot take any of these medications.  I informed him that it is not something that I will  be able to prescribe, but I will be willing to start him on medications to help with his mood.  He was saying on admission that he does not care if he lives, but he denies that with me.  He denies suicidal ideation, homicidal ideation, and auditory or visual hallucination.  Pretty much, the interview was all about him asking for benzodiazepine and morphine.  When I explained to him many times about the risks of being on benzodiazepine, he got angry.  I tried to talk to him about other medications that do not cause the dependency, but he was not fully cooperative with me.  Med seeking behavior is note-worthy.    PAST MEDICAL HISTORY:  See H and P.    Labs: (reviewed/updated 04/10/2019)  Patient Vitals for the past 8 hrs:   BP Temp Pulse Resp SpO2   04/10/19 0805 158/69 98.2 ??F (36.8 ??C) 76 17 94 %   04/10/19 0354 113/72 98.6 ??F (37 ??C) 80 16 98 %     Labs Reviewed   CBC WITH AUTOMATED DIFF - Abnormal; Notable for the following components:       Result Value    WBC 11.5 (*)     NEUTROPHILS 77 (*)     IMMATURE GRANULOCYTES 1 (*)  ABS. NEUTROPHILS 8.8 (*)     ABS. IMM. GRANS. 0.1 (*)     All other components within normal limits   METABOLIC PANEL, COMPREHENSIVE - Abnormal; Notable for the following components:    Glucose 225 (*)     AST (SGOT) 6 (*)     Albumin 2.9 (*)     A-G Ratio 0.8 (*)     All other components within normal limits   ACETAMINOPHEN - Abnormal; Notable for the following components:    Acetaminophen level <2 (*)     All other components within normal limits   FIBRINOGEN - Abnormal; Notable for the following components:    Fibrinogen 553 (*)     All other components within normal limits   D DIMER - Abnormal; Notable for the following components:    D-dimer 1.54 (*)     All other components within normal limits   HEMOGLOBIN A1C WITH EAG - Abnormal; Notable for the following components:    Hemoglobin A1c 9.1 (*)     All other components within normal limits    METABOLIC PANEL, COMPREHENSIVE - Abnormal; Notable for the following components:    Glucose 193 (*)     GFR est non-AA 59 (*)     Albumin 2.8 (*)     A-G Ratio 0.7 (*)     All other components within normal limits   CBC WITH AUTOMATED DIFF - Abnormal; Notable for the following components:    IMMATURE GRANULOCYTES 1 (*)     ABS. IMM. GRANS. 0.1 (*)     All other components within normal limits   FIBRINOGEN - Abnormal; Notable for the following components:    Fibrinogen 508 (*)     All other components within normal limits   D DIMER - Abnormal; Notable for the following components:    D-dimer 1.47 (*)     All other components within normal limits   LIPID PANEL - Abnormal; Notable for the following components:    Cholesterol, total 201 (*)     Triglyceride 209 (*)     LDL, calculated 123.2 (*)     CHOL/HDL Ratio 5.6 (*)     All other components within normal limits   URINALYSIS W/ RFLX MICROSCOPIC - Abnormal; Notable for the following components:    Protein 100 (*)     Blood LARGE (*)     Mucus TRACE (*)     All other components within normal limits   FIBRINOGEN - Abnormal; Notable for the following components:    Fibrinogen 596 (*)     All other components within normal limits   CBC WITH AUTOMATED DIFF - Abnormal; Notable for the following components:    IMMATURE GRANULOCYTES 1 (*)     ABS. IMM. GRANS. 0.1 (*)     All other components within normal limits   METABOLIC PANEL, BASIC - Abnormal; Notable for the following components:    Potassium 3.4 (*)     Glucose 140 (*)     Calcium 8.2 (*)     All other components within normal limits   FIBRINOGEN - Abnormal; Notable for the following components:    Fibrinogen 574 (*)     All other components within normal limits   METABOLIC PANEL, BASIC - Abnormal; Notable for the following components:    Sodium 135 (*)     Glucose 195 (*)     BUN 21 (*)     GFR est non-AA 56 (*)  All other components within normal limits    GLUCOSE, POC - Abnormal; Notable for the following components:    Glucose (POC) 156 (*)     All other components within normal limits   GLUCOSE, POC - Abnormal; Notable for the following components:    Glucose (POC) 218 (*)     All other components within normal limits   GLUCOSE, POC - Abnormal; Notable for the following components:    Glucose (POC) 168 (*)     All other components within normal limits   GLUCOSE, POC - Abnormal; Notable for the following components:    Glucose (POC) 320 (*)     All other components within normal limits   GLUCOSE, POC - Abnormal; Notable for the following components:    Glucose (POC) 212 (*)     All other components within normal limits   GLUCOSE, POC - Abnormal; Notable for the following components:    Glucose (POC) 143 (*)     All other components within normal limits   GLUCOSE, POC - Abnormal; Notable for the following components:    Glucose (POC) 150 (*)     All other components within normal limits   GLUCOSE, POC - Abnormal; Notable for the following components:    Glucose (POC) 155 (*)     All other components within normal limits   GLUCOSE, POC - Abnormal; Notable for the following components:    Glucose (POC) 147 (*)     All other components within normal limits   GLUCOSE, POC - Abnormal; Notable for the following components:    Glucose (POC) 142 (*)     All other components within normal limits   GLUCOSE, POC - Abnormal; Notable for the following components:    Glucose (POC) 168 (*)     All other components within normal limits   GLUCOSE, POC - Abnormal; Notable for the following components:    Glucose (POC) 198 (*)     All other components within normal limits   CULTURE, RESPIRATORY/SPUTUM/BRONCH W GRAM STAIN   ENTERIC BACTERIA PANEL, DNA   SAMPLES BEING HELD   TROPONIN I   D DIMER   ETHYL ALCOHOL   LIPASE   MAGNESIUM   NT-PRO BNP   PROTHROMBIN TIME + INR   SALICYLATE   SARS-COV-2   PROTHROMBIN TIME + INR   PTT   LD   FERRITIN   TROPONIN I   PROTHROMBIN TIME + INR   PTT    TROPONIN I   *UA&MICRO CHARGE BAT   PROTHROMBIN TIME + INR   PTT   D DIMER   WBC, STOOL   PROTHROMBIN TIME + INR   PTT   D DIMER     Lab Results   Component Value Date/Time    Sodium 135 (L) 04/10/2019 03:14 AM    Potassium 4.0 04/10/2019 03:14 AM    Chloride 104 04/10/2019 03:14 AM    CO2 22 04/10/2019 03:14 AM    Anion gap 9 04/10/2019 03:14 AM    Glucose 195 (H) 04/10/2019 03:14 AM    BUN 21 (H) 04/10/2019 03:14 AM    Creatinine 1.28 04/10/2019 03:14 AM    BUN/Creatinine ratio 16 04/10/2019 03:14 AM    GFR est AA >60 04/10/2019 03:14 AM    GFR est non-AA 56 (L) 04/10/2019 03:14 AM    Calcium 8.7 04/10/2019 03:14 AM    Bilirubin, total 0.3 04/08/2019 12:29 AM    Alk. phosphatase 76 04/08/2019 12:29 AM  Protein, total 6.7 04/08/2019 12:29 AM    Albumin 2.8 (L) 04/08/2019 12:29 AM    Globulin 3.9 04/08/2019 12:29 AM    A-G Ratio 0.7 (L) 04/08/2019 12:29 AM    ALT (SGPT) 15 04/08/2019 12:29 AM     No results displayed because visit has over 200 results.        Vitals:    04/09/19 2014 04/10/19 0016 04/10/19 0354 04/10/19 0805   BP: 137/75 132/84 113/72 158/69   Pulse: 85 86 80 76   Resp: '16 16 16 17   '$ Temp: 98.2 ??F (36.8 ??C) 98.6 ??F (37 ??C) 98.6 ??F (37 ??C) 98.2 ??F (36.8 ??C)   SpO2: 98% 98% 98% 94%   Weight:       Height:         Recent Results (from the past 24 hour(s))   GLUCOSE, POC    Collection Time: 04/09/19 11:10 AM   Result Value Ref Range    Glucose (POC) 147 (H) 65 - 100 mg/dL    Performed by Rhae Lerner    GLUCOSE, POC    Collection Time: 04/09/19  4:25 PM   Result Value Ref Range    Glucose (POC) 142 (H) 65 - 100 mg/dL    Performed by Delman Cheadle    GLUCOSE, POC    Collection Time: 04/09/19  9:17 PM   Result Value Ref Range    Glucose (POC) 168 (H) 65 - 100 mg/dL    Performed by [facility]  PCT    PROTHROMBIN TIME + INR    Collection Time: 04/10/19  3:14 AM   Result Value Ref Range    INR 1.0 0.9 - 1.1      Prothrombin time 9.9 9.0 - 11.1 sec   PTT    Collection Time: 04/10/19  3:14 AM    Result Value Ref Range    aPTT 27.1 22.1 - 32.0 sec    aPTT, therapeutic range     58.0 - 77.0 SECS   FIBRINOGEN    Collection Time: 04/10/19  3:14 AM   Result Value Ref Range    Fibrinogen 574 (H) 200 - 475 mg/dL   D DIMER    Collection Time: 04/10/19  3:14 AM   Result Value Ref Range    D-dimer 0.49 0.00 - 0.65 mg/L FEU   METABOLIC PANEL, BASIC    Collection Time: 04/10/19  3:14 AM   Result Value Ref Range    Sodium 135 (L) 136 - 145 mmol/L    Potassium 4.0 3.5 - 5.1 mmol/L    Chloride 104 97 - 108 mmol/L    CO2 22 21 - 32 mmol/L    Anion gap 9 5 - 15 mmol/L    Glucose 195 (H) 65 - 100 mg/dL    BUN 21 (H) 6 - 20 MG/DL    Creatinine 1.28 0.70 - 1.30 MG/DL    BUN/Creatinine ratio 16 12 - 20      GFR est AA >60 >60 ml/min/1.6m    GFR est non-AA 56 (L) >60 ml/min/1.745m   Calcium 8.7 8.5 - 10.1 MG/DL   GLUCOSE, POC    Collection Time: 04/10/19  6:35 AM   Result Value Ref Range    Glucose (POC) 198 (H) 65 - 100 mg/dL    Performed by WaMadilyn FiremanPCT        RADIOLOGY REPORTS:  Results from Hospital Encounter encounter on 04/07/19   XR SHOULDER RT AP/LAT MIN 2  V    Narrative EXAM: XR SHOULDER RT AP/LAT MIN 2 V    INDICATION: fall.    COMPARISON: None.    FINDINGS: Three views of the right shoulder demonstrate no fracture, dislocation  or other acute abnormality. There is degenerative disease.      Impression IMPRESSION: No acute abnormality.   Xr Chest Pa Lat    Result Date: 04/07/2019  Indication: Chest pain. Exam: AP upright and lateral views of the chest. There is no prior study for direct comparison. Findings: Cardiomediastinal silhouette is within normal limits. Lungs are clear bilaterally. Pleural spaces are normal. Osseous structures are intact.     IMPRESSION: No acute cardiopulmonary disease.      Xr Shoulder Rt Ap/lat Min 2 V    Result Date: 04/09/2019  EXAM: XR SHOULDER RT AP/LAT MIN 2 V INDICATION: fall. COMPARISON: None. FINDINGS: Three views of the right shoulder demonstrate no fracture,  dislocation or other acute abnormality. There is degenerative disease.     IMPRESSION: No acute abnormality.    Ct Head Wo Cont    Result Date: 04/09/2019  EXAMINATION:  CT HEAD WO CONT CLINICAL INFORMATION:  Fall COMPARISON:  None. TECHNIQUE: Routine axial head CT was performed. IV contrast was not administered.   Sagittal and coronal reconstructions were generated.  CT dose reduction was achieved through use of a standardized protocol tailored for this examination and automatic exposure control for dose modulation. Adaptive statistical iterative reconstruction (ASIR) was utilized. FINDINGS: No acute infarct, hemorrhage or mass. VENTRICULAR SYSTEM:  Normal for age. BASAL CISTERNS:  Patent. BRAIN PARENCHYMA:  No significant abnormalities. MIDLINE SHIFT:  None. CALVARIUM/ SKULL BASE: Intact. PARANASAL SINUSES AND MASTOID AIR CELLS: Probable retention cyst in the posterior left sphenoid sinus. Partial opacification of the left anterior ethmoid air cells. Asymmetric density in the left nasal cavity which could represent a polypoid lesion. Postsurgical changes with apparent prior resection of the left lateral nasal wall and superior aspect of the left maxillary sinus, with formation of any ostium. VISUALIZED ORBITS: No significant abnormalities. SELLA: No enlargement.     IMPRESSION:  1. No significant intracranial abnormalities. No evidence of acute intracranial injury. 2. Postsurgical changes in the left maxillary sinus. Asymmetric density in the left nasal cavity. A polypoid lesion may be present. Clinical correlation is recommended.     Ct Spine Cerv Wo Cont    Result Date: 04/09/2019  EXAM:  CT CERVICAL SPINE WITHOUT CONTRAST INDICATION: fall. COMPARISON: None. CONTRAST:  None. TECHNIQUE: Multislice helical CT of the cervical spine was performed without intravenous contrast administration.  Sagittal and coronal reformats were generated.  CT dose reduction was achieved  through use of a standardized protocol tailored for this examination and automatic exposure control for dose modulation. FINDINGS: The alignment is within normal limits. The craniocervical junction is within normal limits. The odontoid process and C1-2 articulation are intact. There is a displaced avulsion fracture of the tip of the T1 spinous process. At the base of the fracture, there appears to be bone sclerosis and there is density traversing the fracture site. There is also a lack of overlying soft tissue swelling. Imaging features are most suggestive of a chronic fracture. There are no definite acute fractures. There is prominent anterior paravertebral ossification at C5-6 and C6-7, and to a lesser extent at C3-4 and C4-5. There is a probable retention cyst in the posterior aspect of the left sphenoid air cell. C2-C3: There is no spinal canal or neural foraminal stenosis. C3-C4: There is  no spinal canal or significant foraminal stenosis. There is moderate right and mild left facet hypertrophy.. C4-C5: There is no spinal canal or neural foraminal stenosis. There is moderate right and mild left facet hypertrophy. C5-C6: There is no spinal canal or neural foraminal stenosis. There is mild to moderate right facet hypertrophy. C6-C7: There is no spinal canal or neural foraminal stenosis. C7-T1: There is no spinal canal or neural foraminal stenosis.     IMPRESSION: 1. Avulsion fracture of the tip of the T1 spinous process, probably chronic. Clinical correlation with mechanism of injury and location of tenderness is recommended. 2. Degenerative changes with facet joint hypertrophy predominantly on the right and with anterior paravertebral ossification. No significant central or foraminal stenosis.    [place] Abd Comp    Result Date: 04/09/2019  EXAM: Ultrasound of the abdomen. INDICATION:  abd pain TECHNIQUE: High-resolution grayscale and selected color flow/Doppler evaluation of  the abdomen performed. COMPARISON:  None. FINDINGS: Liver:  Normal size with diffuse increase in echotexture and no focal lesion. Normal hepatopedal flow in the portal vein. Gallbladder:  Normal.  There is no gallbladder stone, distention, pericholecystic fluid, sonographic Murphy's sign, or gallbladder wall thickening. Bile ducts:  Normal.  CBD 5.4 mm. No intrahepatic biliary dilation. Spleen:  Normal. Kidneys:  Bilaterally echogenic with no stone, mass, or hydronephrosis.  Kidneys measure 11.6 cm on the right and 11.2 cm on the left. Pancreas:  Obscured by bowel gas and not well evaluated. Abdominal aorta:  Obscured by body habitus/bowel gas and not well evaluated. IVC:  Visualized portions normal. Misc:  No pleural effusion or ascites identified.     IMPRESSION:  1. Increased hepatic echogenicity compatible with diffuse hepatocellular process, most commonly seen in steatosis. 2. Echogenic appearance of the kidneys may reflect medical renal disease. 3. Pancreas and aorta not well evaluated due to obscuration by overlying bowel gas.       PAST PSYCHIATRIC HISTORY:  He stated he was seeing a psychiatrist in [place] and was prescribed Xanax 2 mg three times a day and prior to that was also prescribed Klonopin and Valium.    PSYCHOSOCIAL HISTORY:  He is a widow.  He has a daughter.  He is homeless.    MENTAL STATUS EXAMINATION:  He is currently lying in bed, dressed in street clothes.  He is labile during the interview.  He reports his mood is not good.  Affect is constricted.  Speech normal rate and rhythm.  Thought process:  Logical and goal-directed.  He denies suicidal ideation, homicidal ideation, auditory or visual hallucination.  No paranoia or delusions.  Memory is intact.  Intelligence is average.  Insight is poor.  Judgment is poor.    ASSESSMENT AND PLAN:  The patient meets the criteria for unspecified mood disorder.  I informed him that benzodiazepine is not something that we  will be able to prescribe.  I tried to talk to him about putting him on other classic antidepressants and/or mood stabilizers, but he was not forthcoming.  He said that he has tried all those medications, it did not work, and Xanax was the only medication that worked for him.  Unfortunately, I was not able to come up with a definite plan with him because he kept asking for benzodiazepine.  If he later changes his mind, please consider starting him on Cymbalta 20 mg daily to help with the depression, anxiety and pain.  Please also consider Seroquel 25 mg every six hours as needed for hallucinations.  Please continue CIWA protocol.  He can followup with the local [facility], either [place] or [place] ([place] for outpatient services.  There is no indication for inpatient psychiatric admission.  Please discharge to home once medically stable.    Thank you for this consult.  Please call with questions.      Quintasha Gren A Gracielynn Birkel, NP      [id number]  D:  04/09/2019 17:18  T:  04/09/2019 20:26  JOB #:  [id number]

## 2019-04-09 NOTE — Progress Notes (Signed)
Problem: Diabetes Self-Management  Goal: *Using medications safely  Description: State effect of diabetes medications on diabetes; name diabetes medication taking, action and side effects.  Outcome: Progressing Towards Goal  Goal: *Prevention, detection, treatment of acute complications  Description: List symptoms of hyper- and hypoglycemia; describe how to treat low blood sugar and actions for lowering  high blood glucose level.  Outcome: Progressing Towards Goal     Problem: Unstable angina/NSTEMI: Day of Admission/Day 1  Goal: Activity/Safety  Outcome: Progressing Towards Goal  Goal: Diagnostic Test/Procedures  Outcome: Progressing Towards Goal  Goal: Nutrition/Diet  Outcome: Progressing Towards Goal  Goal: Medications  Outcome: Progressing Towards Goal  Goal: Respiratory  Outcome: Progressing Towards Goal     Problem: Cath Lab Procedures: Post-Cath Day of Procedure (Initiate SCIP Measures for Post-Op Care)  Goal: Medications  Outcome: Progressing Towards Goal

## 2019-04-09 NOTE — Other (Deleted)
Patient admitted with atypical chest pain/anxiety.  Pt noted to have diabetes documented in attending's progress notes. Please document in progress notes and discharge summary the type of DM and if it is with or without hyperglycemia:    ? DM type I with/without hyperglycemia  ? DM type II with/without hyperglycemia  ? DM due to other, please specify  ? Clinically unable to determine    The medical record reflects the following:      Risk Factors: 72 M with hx/o DM documented, patient reports he takes insulin    Clinical Indicators: Diabetes Management Clinical Nurse Specialist note 6/9 states DM II, accucheck readings 150-320 during admission,  HgA1C 9    Treatment: accuchecks, patient started on NPH 10 units BID, consult to Diabetes Management

## 2019-04-09 NOTE — Progress Notes (Signed)
Patient settled into room, placed on bed alarm, and ambulated to bathroom with 1x assist and walker.    Bedside shift change report given to Kaycee RN (oncoming nurse) by Sabrina RN (offgoing nurse). Report included the following information SBAR, Kardex, MAR and Accordion.

## 2019-04-09 NOTE — Consults (Signed)
68yo with recent fall. CT of cervical spine shows chronic fracture at T1.  No evidence of acute fracture or malalignment.  No treatment indicated.  Please feel free to reconsult if additional neurosurgical concerns arise.

## 2019-04-09 NOTE — Progress Notes (Addendum)
TOC:  1. CT RESULTED.  2. Psych eval. Pending.  3. Placement pending.  3. PT/OT    CM noted patient had RR this morning, and is going to have a CT for his head due to a fall per RN note. Psych to see patient today, and cm will follow to determine placement for patient.    Kristen Walker, BSHCM

## 2019-04-09 NOTE — Progress Notes (Signed)
 [id number] - OUT REPORT:    Verbal report given to Sabrina RN  E(name) on Derek Mclaughlin  being transferred to 5E(unit) for routine progression of care       Report consisted of patient's Situation, Background, Assessment and   Recommendations(SBAR).     Information from the following report(s) SBAR, Intake/Output, MAR and Recent Results was reviewed with the receiving nurse.    Lines:   Peripheral IV 04/08/19 Right Antecubital (Active)   Site Assessment Clean, dry, & intact 04/09/2019  3:44 PM   Phlebitis Assessment 0 04/09/2019  3:44 PM   Infiltration Assessment 0 04/09/2019  3:44 PM   Dressing Status Clean, dry, & intact 04/09/2019  3:44 PM   Dressing Type Tape;Transparent 04/09/2019  3:44 PM   Hub Color/Line Status Blue;Capped 04/09/2019  3:44 PM   Action Taken Open ports on tubing capped 04/09/2019  3:44 PM   Alcohol Cap Used Yes 04/09/2019  3:44 PM        Opportunity for questions and clarification was provided.      Patient transported with:  Transport

## 2019-04-09 NOTE — Progress Notes (Signed)
Problem: Diabetes Self-Management  Goal: *Using medications safely  Description: State effect of diabetes medications on diabetes; name diabetes medication taking, action and side effects.  Outcome: Progressing Towards Goal  Goal: *Prevention, detection, treatment of acute complications  Description: List symptoms of hyper- and hypoglycemia; describe how to treat low blood sugar and actions for lowering  high blood glucose level.  Outcome: Progressing Towards Goal     Problem: Unstable angina/NSTEMI: Day of Admission/Day 1  Goal: Activity/Safety  Outcome: Progressing Towards Goal  Goal: Diagnostic Test/Procedures  Outcome: Progressing Towards Goal  Goal: Nutrition/Diet  Outcome: Progressing Towards Goal  Goal: Medications  Outcome: Progressing Towards Goal  Goal: Respiratory  Outcome: Progressing Towards Goal     Problem: Cath Lab Procedures: Post-Cath Day of Procedure (Initiate SCIP Measures for Post-Op Care)  Goal: Medications  Outcome: Progressing Towards Goal

## 2019-04-09 NOTE — Progress Notes (Signed)
Progress  Notes by Ronnie DerbyMadala, Mikah Rottinghaus, MD at 04/09/19 1142                Author: Ronnie DerbyMadala, Milos Milligan, MD  Service: Hospitalist  Author Type: Physician       Filed: 04/09/19 1224  Date of Service: 04/09/19 1142  Status: Signed          Editor: Ronnie DerbyMadala, Lucinda Spells, MD (Physician)                                                Hospitalist Progress Note         Hospital summary: Derek Mclaughlin is a 68 y.o. male who presents with PMH of DM, [name], HTN, [name], COPD here with c/o  chest pain x1 week and worsening since last night with associated N/V and also having shortness of breath and cough.??He describes pain as tight heaviness to left side of chest and radiates up to neck and jaw. ??[place] from [place] and states  he was supposed to living with friends when he got here but the house was vacant when he arrived. Has lived in motel until he ran out of money now homeless. He has been out of medications for about 1 week prior to coming to ED. He states he has a  history of alcohol dependence and restarted drinking alcohol about 2 weeks prior to coming to ED and drinks 12 beers/day. He states he has a hx of bipolar and schizophrenia and has had an increase in symptoms of depression over last few months, he  stated he doesn't care if he lives but denies suicidal and homicidal ideations.He states he is having auditory and visual hallucinations x2 weeks where he hears his deceased wifes voice and thinks he has seen her in a room with him.    Work up in ED: negative CXR, EKG, cardiology consult, ECHO ordered, labs reviewed, psych consult, r/o COVID.  04/07/2019         Assessment/Plan:   Atypical Chest Pain:    - likely due to anxiety   -ZOX:WRUEAVEKG:Normal sinus rhythm, Inferior infarct, age undetermined, RBBB   -troponin negative x 3 negative   -ECHO: EF 55 - 60%. No regional wall motion abnormality noted. Mild (grade 1) left ventricular diastolic dysfunction.   -cardiology on board   - cardiac cath 6/9: Normal LVEDP.       Suspect BZD  withdrawals   - pt c/w N/V, diarrhea. Takes Xanax 2mg  tid last filled in May 20th 90 pills but patientstates he lost his medication while travelling here. Last dose was on Thursday 04/03/2019   - IV ativan prn       Severe anxiety/ Panic attacks    C/w Hallucinations: hx of Bipolar and schizophrenia.    - Auditory and visual hallucinations of deceased wife.    -denies HI and SI, not on any mood medications except for Xanax   -will wait for psych recommendations     - hydroxyzine prn       DM type II with hyperglycemia   - pt states he takes insulin Humalog 50 U with breakfast, 30U with lunch and 50U with dinener   - HGA1c 9   - started on NPH 5U bid. C/w ISS      [name]:    -lipid panel LDL 123   - started on statin   ????  HTN: BP elevated   - not on any meds per pharmacy   - started on lisinopril  daily     -prn hydralazine      COPD not in exacerbation   -CXR negative   -guaifenesin as needed for cough, albuterol inhaler as needed   - COVID 19 negative ??   - recommend pulmonology and PFT eval as outpt        ETOH Dependence:    - drinks 12 beers/day.   -CIWA protocol   ??   Tobacco Dependence: current smoker 2ppd-1/2ppd.   -not interested in quitting, counseled for cessation   -nicotine patch      Fall 6/10   - CT head: No significant intracranial abnormalities   - CT neck: Avulsion fracture of the tip of the T1 spinous process, probably chronic   - NSGY consult placed    ??   Homelessness   -case management consult for resources   ??   Code status: Full   DVT prophylaxis: heparin    Disposition: TBD   ----------------------------------------------      CC: Chest pain      S: Patient is seen and examined at bedside. He c/w nausea, vomiting and diarrhea this AM. He rushed to go to washroom. Then he was unable to  move after that and had a fall. RRT called. Stat CT head and neck were done. He also c/o pain all over  - joint pains, back pain.      Called his pharmacy [facility] at [facility] @ [phone] ( number  provided by patient) - patient was taking only 2 medications xanax and Humalog. He had percocet x 5 days in feb 2020 ( pt stated e atkes percocet 10-325mg  tid)      Review of Systems:   As above       O:   Visit Vitals      BP  147/88        Pulse  81     Temp  98.2 ??F (36.8 ??C)     Resp  18     Ht   (1.778 m)     Wt  116.9 kg (257 lb 11.2 oz)     SpO2  96%        BMI  36.98 kg/m??           PHYSICAL EXAM:   Gen: NAD, obese    HEENT: anicteric sclerae, normal conjunctiva, oropharynx clear, MM moist   Neck: supple, trachea midline, no adenopathy   Heart: RRR, no MRG, no JVD, no peripheral edema   Lungs: CTA b/l, non-labored respirations   Abd: soft, NT, ND, BS+, no organomegaly   Extr: warm   Skin: dry, no rash   Neuro: CN II-XII grossly intact, normal speech, moves all extremities   Psych:  Anxious          Intake/Output Summary (Last 24 hours) at 04/09/2019 1142   Last data filed at 04/09/2019 0724     Gross per 24 hour        Intake  120 ml        Output  1500 ml        Net  -1380 ml            Recent labs & imaging reviewed:     Recent Results (from the past 24 hour(s))     GLUCOSE, POC          Collection  Time: 04/08/19  5:30 PM         Result  Value  Ref Range            Glucose (POC)  212 (H)  65 - 100 mg/dL       Performed by  Donnamae JudePierce Amanda K         GLUCOSE, POC          Collection Time: 04/08/19  9:51 PM         Result  Value  Ref Range            Glucose (POC)  143 (H)  65 - 100 mg/dL       Performed by  Ralph LeydenPierce Amanda K         PROTHROMBIN TIME + INR          Collection Time: 04/09/19  1:11 AM         Result  Value  Ref Range            INR  1.0  0.9 - 1.1         Prothrombin time  10.3  9.0 - 11.1 sec       PTT          Collection Time: 04/09/19  1:11 AM         Result  Value  Ref Range            aPTT  27.1  22.1 - 32.0 sec            aPTT, therapeutic range       58.0 - 77.0 SECS       FIBRINOGEN          Collection Time: 04/09/19  1:11 AM         Result  Value  Ref Range            Fibrinogen  596  (H)  200 - 475 mg/dL       D DIMER          Collection Time: 04/09/19  1:11 AM         Result  Value  Ref Range            D-dimer  0.55  0.00 - 0.65 mg/L FEU       CBC WITH AUTOMATED DIFF          Collection Time: 04/09/19  1:11 AM         Result  Value  Ref Range            WBC  10.8  4.1 - 11.1 K/uL       RBC  4.91  4.10 - 5.70 M/uL       HGB  14.1  12.1 - 17.0 g/dL       HCT  96.243.2  95.236.6 - 50.3 %       MCV  88.0  80.0 - 99.0 FL       MCH  28.7  26.0 - 34.0 PG       MCHC  32.6  30.0 - 36.5 g/dL       RDW  84.113.6  32.411.5 - 14.5 %       PLATELET  244  150 - 400 K/uL       MPV  11.6  8.9 - 12.9 FL       NRBC  0.0  0 PER 100 WBC       ABSOLUTE NRBC  0.00  0.00 - 0.01 K/uL       NEUTROPHILS  63  32 - 75 %       LYMPHOCYTES  26  12 - 49 %       MONOCYTES  7  5 - 13 %       EOSINOPHILS  2  0 - 7 %       BASOPHILS  1  0 - 1 %       IMMATURE GRANULOCYTES  1 (H)  0.0 - 0.5 %       ABS. NEUTROPHILS  6.9  1.8 - 8.0 K/UL       ABS. LYMPHOCYTES  2.8  0.8 - 3.5 K/UL       ABS. MONOCYTES  0.8  0.0 - 1.0 K/UL       ABS. EOSINOPHILS  0.2  0.0 - 0.4 K/UL       ABS. BASOPHILS  0.1  0.0 - 0.1 K/UL       ABS. IMM. GRANS.  0.1 (H)  0.00 - 0.04 K/UL       DF  AUTOMATED          METABOLIC PANEL, BASIC          Collection Time: 04/09/19  1:11 AM         Result  Value  Ref Range            Sodium  138  136 - 145 mmol/L       Potassium  3.4 (L)  3.5 - 5.1 mmol/L       Chloride  105  97 - 108 mmol/L       CO2  23  21 - 32 mmol/L       Anion gap  10  5 - 15 mmol/L       Glucose  140 (H)  65 - 100 mg/dL       BUN  16  6 - 20 MG/DL       Creatinine  7.84  0.70 - 1.30 MG/DL       BUN/Creatinine ratio  16  12 - 20         GFR est AA  >60  >60 ml/min/1.21m2       GFR est non-AA  >60  >60 ml/min/1.42m2       Calcium  8.2 (L)  8.5 - 10.1 MG/DL       GLUCOSE, POC          Collection Time: 04/09/19  7:50 AM         Result  Value  Ref Range            Glucose (POC)  150 (H)  65 - 100 mg/dL       Performed by  Davis Gourd         GLUCOSE, POC           Collection Time: 04/09/19  9:07 AM         Result  Value  Ref Range            Glucose (POC)  155 (H)  65 - 100 mg/dL       Performed by  Clide Cliff         GLUCOSE, POC          Collection Time: 04/09/19 11:10 AM         Result  Value  Ref Range  Glucose (POC)  147 (H)  65 - 100 mg/dL            Performed by  Austin Miles            Recent Labs            04/09/19   0111  04/08/19   0029     WBC  10.8  11.0     HGB  14.1  13.8     HCT  43.2  42.5         PLT  244  277          Recent Labs             04/09/19   0111  04/08/19   0029  04/07/19   1112     NA  138  137  137     K  3.4*  4.4  3.8     CL  105  103  105     CO2  BUN  CREA  0.98  1.23  1.09     GLU  140*  193*  225*     CA  8.2*  8.5  8.6          MG   --    --   1.7          Recent Labs            04/08/19   0029  04/07/19   1112     ALT  15  14     AP  76  74     TBILI  0.3  0.5     TP  6.7  6.7     ALB  2.8*  2.9*     GLOB  3.9  3.8         LPSE   --   124          Recent Labs             04/09/19   0111  04/08/19   0029  04/07/19   1649     INR  1.0  1.0  1.0     PTP  10.3  10.4  10.3          APTT  27.1  26.8  26.8           Recent Labs           04/07/19   1649        FERR  131         No results found for: FOL, RBCF    No results for input(s): PH, PCO2, PO2 in the last 72 hours.     Recent Labs             04/08/19   0202  04/07/19   1649  04/07/19   1112          TROIQ  <0.05  <0.05  <0.05          Lab Results         Component  Value  Date/Time            Cholesterol, total  201 (H)  04/08/2019 12:29 AM       HDL Cholesterol  36  04/08/2019 12:29 AM       LDL, calculated  123.2 (  H)  04/08/2019 12:29 AM       Triglyceride  209 (H)  04/08/2019 12:29 AM            CHOL/HDL Ratio  5.6 (H)  04/08/2019 12:29 AM          Lab Results         Component  Value  Date/Time            Glucose (POC)  147 (H)  04/09/2019 11:10 AM       Glucose (POC)  155 (H)  04/09/2019 09:07 AM       Glucose (POC)  150 (H)  04/09/2019  07:50 AM       Glucose (POC)  143 (H)  04/08/2019 09:51 PM            Glucose (POC)  212 (H)  04/08/2019 05:30 PM          Lab Results         Component  Value  Date/Time            Color  YELLOW/STRAW  04/08/2019 02:07 AM       Appearance  CLEAR  04/08/2019 02:07 AM       Specific gravity  1.019  04/08/2019 02:07 AM       pH (UA)  5.0  04/08/2019 02:07 AM       Protein  100 (A)  04/08/2019 02:07 AM       Glucose  Negative  04/08/2019 02:07 AM       Ketone  Negative  04/08/2019 02:07 AM       Bilirubin  Negative  04/08/2019 02:07 AM       Urobilinogen  0.2  04/08/2019 02:07 AM       Nitrites  Negative  04/08/2019 02:07 AM       Leukocyte Esterase  Negative  04/08/2019 02:07 AM       Epithelial cells  FEW  04/08/2019 02:07 AM       Bacteria  Negative  04/08/2019 02:07 AM       WBC  0-4  04/08/2019 02:07 AM            RBC  50-100  04/08/2019 02:07 AM           Med list reviewed     Current Facility-Administered Medications          Medication  Dose  Route  Frequency           ?  thiamine HCL (B-1) tablet 100 mg   100 mg  Oral  DAILY     ?  folic acid (FOLVITE) tablet 1 mg   1 mg  Oral  DAILY     ?  therapeutic multivitamin (THERAGRAN) tablet 1 Tab   1 Tab  Oral  DAILY     ?  LORazepam (ATIVAN) injection 1 mg   1 mg  IntraVENous  Q4H PRN     ?  cloNIDine HCL (CATAPRES) tablet 0.2 mg   0.2 mg  Oral  Q4H PRN     ?  insulin NPH (NOVOLIN N, HUMULIN N) injection 5 Units   5 Units  SubCUTAneous  ACB/HS     ?  sodium chloride (NS) flush 5-40 mL   5-40 mL  IntraVENous  Q8H     ?  sodium chloride (NS) flush 5-40 mL   5-40 mL  IntraVENous  PRN     ?  famotidine (PEPCID) tablet 20 mg  20 mg  Oral  BID     ?  morphine injection 0.5 mg   0.5 mg  IntraVENous  Q6H PRN     ?  hydrOXYzine HCL (ATARAX) tablet 25 mg   25 mg  Oral  TID PRN           ?  atorvastatin (LIPITOR) tablet 40 mg   40 mg  Oral  QHS           ?  sodium chloride (NS) flush 5-40 mL   5-40 mL  IntraVENous  Q8H     ?  sodium chloride (NS) flush 5-40 mL   5-40 mL   IntraVENous  PRN     ?  heparin (porcine) injection 5,000 Units   5,000 Units  SubCUTAneous  Q8H     ?  prochlorperazine (COMPAZINE) tablet 10 mg   10 mg  Oral  Q6H PRN     ?  glucose chewable tablet 16 g   4 Tab  Oral  PRN     ?  glucagon (GLUCAGEN) injection 1 mg   1 mg  IntraMUSCular  PRN     ?  insulin lispro (HUMALOG) injection     SubCUTAneous  AC&HS     ?  acetaminophen (TYLENOL) tablet 650 mg   650 mg  Oral  Q4H PRN     ?  hydrALAZINE (APRESOLINE) 20 mg/mL injection 20 mg   20 mg  IntraVENous  Q6H PRN     ?  guaiFENesin (ROBITUSSIN) 100 mg/5 mL oral liquid 100 mg   100 mg  Oral  Q4H PRN     ?  ondansetron (ZOFRAN) injection 4 mg   4 mg  IntraVENous  Q4H PRN     ?  nicotine (NICODERM CQ) 14 mg/24 hr patch 1 Patch   1 Patch  TransDERmal  DAILY     ?  albuterol (PROVENTIL HFA, VENTOLIN HFA, PROAIR HFA) inhaler 2 Puff   2 Puff  Inhalation  Q4H PRN           ?  aspirin chewable tablet 81 mg   81 mg  Oral  DAILY           Care Plan discussed with:   Patient/Family and Nurse      Cheryll Cockayne, MD   Internal Medicine   Date of Service: 04/09/2019

## 2019-04-09 NOTE — Progress Notes (Signed)
TOC:  1. CT RESULTED.  2. Psych eval. Pending.  3. Placement pending.  3. PT/OT    CM noted patient had RR this morning, and is going to have a CT for his head due to a fall per RN note. Psych to see patient today, and cm will follow to determine placement for patient.    Delma Officer, [facility]

## 2019-04-09 NOTE — Progress Notes (Signed)
 Post Fall Documentation      Derek Mclaughlin witnessed/unwitnessed fall occurred on 04/09/19 (Date) at 0900 (Time).     The answers to the following questions summarize the fall:     In the patient's own words,:   What were you attempting to do when you fell?  Going to the bathrrom    Do you know why you fell? No    Do you have any pain/discomfort or any other complaints?  Back pain   Which part of your body made contact with the floor or other object?  Arms and head     Nurse:  . Was this an assisted fall? no  . Was fall witnessed? No  . If witnessed, what part of the body made contact with the floor or other object?  n/a  . Patient's mental status after the fall/when found: Confused  . Any apparent injury:  No apparent injury  . Immediate interventions for injury/suspected injury? No interventions needed  . Patient assisted back to bed? Mechanical lift and Mobility team  . Name of provider notified and time, any comments? Madala 04/09/19 0900       . Name of family member notified and time: n/a      Immediate VS and physical assessment documented in flow sheets.    Neuro assessment every hour x 4 (for potential head injury or unwitnessed fall) documented in flow sheets.      Lauraine FORBES Pouch, RN

## 2019-04-09 NOTE — Progress Notes (Signed)
Physical Therapy 04/09/2019    Orders received and chart reviewed up to date. Pt received in bed, RN bedside. Pt declined mobility at this time, agreed to re-attempt later/tomorrow as schedule permits. Will f/u as appropriate.     Recommend with nursing patient to complete as able in order to maintain strength, endurance and independence: OOB 3x/day.    Thank you.  Alfonse Alpers, PT, DPT

## 2019-04-09 NOTE — Progress Notes (Signed)
Patient settled into room, placed on bed alarm, and ambulated to bathroom with 1x assist and walker.    Bedside shift change report given to Diona Browner RN (oncoming nurse) by Martie Lee RN (offgoing nurse). Report included the following information SBAR, Kardex, MAR and Accordion.

## 2019-04-09 NOTE — Progress Notes (Signed)
Progress  Notes by Veneda MelterGupta, Amyr Sluder, MD at 04/09/19 1306                Author: Veneda MelterGupta, Sesilia Poucher, MD  Service: Cardiology  Author Type: Physician       Filed: 04/09/19 1315  Date of Service: 04/09/19 1306  Status: Signed          Editor: Veneda MelterGupta, Kurk Corniel, MD (Physician)                               Progress Note          Patient: Derek Mclaughlin  MRN: [id number]   SSN: [id number]          Date of Birth: 09/01/1951   Age: 68 y.o.   Sex: male         Admit Date: 04/07/2019     LOS: 1 day         Subjective:        Derek Mclaughlin is a 68 yo WM with bipolar disorder/schizophrenia, PAF, ETOH/Tob abuse admitted off medications with chest pain and shortness of breath. Cardiac catheterization performed with reported history of multiple stents per patient and prior myocardial  infarction. Cardiac cath with moderate LAD lesion for medical therapy.       He is tearful this morning. Complains of pain and "nerves shot" all over. Fell in room returning from bathroom. States morphine and ativan in ER helped a lot, and why can he not get these now.         Objective:          Vitals:             04/09/19 0903  04/09/19 0931  04/09/19 1110  04/09/19 1222           BP:  (!) 174/94  155/84  147/88  153/84     Pulse:  98  85  81  96     Resp:  16  18  18  18      Temp:  97.2 ??F (36.2 ??C)  97.8 ??F (36.6 ??C)  98.2 ??F (36.8 ??C)  98.1 ??F (36.7 ??C)     SpO2:  95%  94%  96%  96%     Weight:                   Height:                 Temp (24hrs), Avg:98 ??F (36.7 ??C), Min:97.2 ??F (36.2 ??C), Max:98.8 ??F (37.1 ??C)         Intake and Output:   Current Shift: 06/10 0701 - 06/10 1900   In: -    Out: 1175 [Urine:1175]   Last three shifts: 06/08 1901 - 06/10 0700   In: 120 [P.O.:120]   Out: 1000 [Urine:1000]      Physical Exam:      General:   Alert, cooperative, well nourished, well developed, appears stated age, overweight        Eyes:   Conjunctivae/corneas clear         Nose:  Nares normal. Septum midline. Mucosa normal. No drainage or sinus tenderness.         Neck:  Supple, symmetrical, trachea midline, no JVD     Lungs:    Clear to auscultation bilaterally, good effort     Heart:   Regular rate and rhythm, no murmur, click, rub, or gallop  Abdomen:    Soft, non-tender, bowel sounds normal, non-distended     Extremities:  Normal, atraumatic, no cyanosis or edema     Skin:  Skin color, texture, turgor normal. No rash or lesions.        Neurologic:  Non focal, anxious           Current Facility-Administered Medications:    ?  thiamine HCL (B-1) tablet 100 mg, 100 mg, Oral, DAILY, Madala, Sushma, MD, 100 mg at 04/09/19 1131   ?  folic acid (FOLVITE) tablet 1 mg, 1 mg, Oral, DAILY, Madala, Sushma, MD, 1 mg at 04/09/19 1131   ?  therapeutic multivitamin (THERAGRAN) tablet 1 Tab, 1 Tab, Oral, DAILY, Madala, Sushma, MD, 1 Tab at 04/09/19 1131   ?  LORazepam (ATIVAN) injection 1 mg, 1 mg, IntraVENous, Q4H PRN, Madala, Sushma, MD   ?  insulin NPH (NOVOLIN N, HUMULIN N) injection 5 Units, 5 Units, SubCUTAneous, ACB/HS, Madala, Sushma, MD   ?  lisinopriL (PRINIVIL, ZESTRIL) tablet 20 mg, 20 mg, Oral, DAILY, Madala, Sushma, MD, 20 mg at 04/09/19 1225   ?  sodium chloride (NS) flush 5-40 mL, 5-40 mL, IntraVENous, Q8H, Kaushik, Manu, MD, 10 mL at 04/09/19 0718   ?  sodium chloride (NS) flush 5-40 mL, 5-40 mL, IntraVENous, PRN, Berniece Salines, MD   ?  famotidine (PEPCID) tablet 20 mg, 20 mg, Oral, BID, Madala, Sushma, MD, 20 mg at 04/09/19 1131   ?  morphine injection 0.5 mg, 0.5 mg, IntraVENous, Q6H PRN, Madala, Sushma, MD, 0.5 mg at 04/09/19 1253   ?  hydrOXYzine HCL (ATARAX) tablet 25 mg, 25 mg, Oral, TID PRN, Madala, Sushma, MD, 25 mg at 04/09/19 1226   ?  atorvastatin (LIPITOR) tablet 40 mg, 40 mg, Oral, QHS, Madala, Sushma, MD   ?  sodium chloride (NS) flush 5-40 mL, 5-40 mL, IntraVENous, Q8H, Royal, Rosla, NP, 10 mL at 04/09/19 0718   ?  sodium chloride (NS) flush 5-40 mL, 5-40 mL, IntraVENous, PRN, Royal, Rosla, NP   ?  heparin (porcine) injection 5,000 Units, 5,000  Units, SubCUTAneous, Q8H, Royal, Rosla, NP, 5,000 Units at 04/09/19 1132   ?  prochlorperazine (COMPAZINE) tablet 10 mg, 10 mg, Oral, Q6H PRN, Royal, Rosla, NP   ?  glucose chewable tablet 16 g, 4 Tab, Oral, PRN, Royal, Rosla, NP   ?  glucagon (GLUCAGEN) injection 1 mg, 1 mg, IntraMUSCular, PRN, Royal, Rosla, NP   ?  insulin lispro (HUMALOG) injection, , SubCUTAneous, AC&HS, Royal, Rosla, NP, 2 Units at 04/09/19 1211   ?  acetaminophen (TYLENOL) tablet 650 mg, 650 mg, Oral, Q4H PRN, Royal, Rosla, NP   ?  hydrALAZINE (APRESOLINE) 20 mg/mL injection 20 mg, 20 mg, IntraVENous, Q6H PRN, Royal, Rosla, NP, 20 mg at 04/08/19 2001   ?  guaiFENesin (ROBITUSSIN) 100 mg/5 mL oral liquid 100 mg, 100 mg, Oral, Q4H PRN, Royal, Rosla, NP   ?  ondansetron (ZOFRAN) injection 4 mg, 4 mg, IntraVENous, Q4H PRN, Royal, Rosla, NP   ?  nicotine (NICODERM CQ) 14 mg/24 hr patch 1 Patch, 1 Patch, TransDERmal, DAILY, Royal, Rosla, NP, 1 Patch at 04/09/19 1130   ?  albuterol (PROVENTIL HFA, VENTOLIN HFA, PROAIR HFA) inhaler 2 Puff, 2 Puff, Inhalation, Q4H PRN, Royal, Rosla, NP   ?  aspirin chewable tablet 81 mg, 81 mg, Oral, DAILY, Royal, Rosla, NP, 81 mg at 04/09/19 1131      Lab/Data Review:         Labs  Latest Ref Rng & Units  04/09/2019  04/08/2019  04/07/2019           WBC  4.1 - 11.1 K/uL  10.8  11.0  11.5(H)           RBC  4.10 - 5.70 M/uL  4.91  4.77  5.03     Hemoglobin  12.1 - 17.0 g/dL  16.114.1  09.613.8  04.514.6     Hematocrit  36.6 - 50.3 %  43.2  42.5  44.6     MCV  80.0 - 99.0 FL  88.0  89.1  88.7     Platelets  150 - 400 K/uL  244  277  228     Lymphocytes  12 - 49 %  26  26  14      Monocytes  5 - 13 %  7  7  6      Eosinophils  0 - 7 %  2  2  2      Basophils  0 - 1 %  1  1  0           Albumin  3.5 - 5.0 g/dL  -  2.8(L)  2.9(L)           Calcium  8.5 - 10.1 MG/DL  8.2(L)  8.5  8.6           Glucose  65 - 100 mg/dL  409(W140(H)  119(J193(H)  478(G225(H)           BUN  6 - 20 MG/DL  16  19  15      Creatinine  0.70 - 1.30 MG/DL  9.560.98  2.131.23  0.861.09     Sodium   136 - 145 mmol/L  138  137  137           Potassium  3.5 - 5.1 mmol/L  3.4(L)  4.4  3.8           LDH  85 - 241 U/L  -  -  176             04/07/19     ECHO ADULT COMPLETE 04/08/2019 04/08/2019           Narrative  ?? Image quality for this study was suboptimal.   ?? Contrast used: DEFINITY.   ?? Normal cavity size, wall thickness and systolic function (ejection    fraction normal). Estimated left ventricular ejection fraction is 55 -    60%. No regional wall motion abnormality noted. Mild (grade 1) left    ventricular diastolic dysfunction.   ?? Mildly dilated right atrium.   ?? Trace mitral valve regurgitation is present.                 Signed by: Veneda MelterGupta, Kyrsten Deleeuw, MD             04/07/19     CARDIAC PROCEDURE 04/08/2019 04/08/2019           Narrative  Findings:   1)Normal LVEDP   2)Angiographically moderate to severe disease in mid LAD(70%) not    functionally significant based on iFR(0.93).   3)Mild disease elsewhere      Recommendations   1)Consider alternative etiology for chest pain   2)GDT for primary prevention of [name] events      Access: Right radial--no issues      Contrast 50 cc                Signed by: Ricki RodriguezKaushik, Manu, MD  Assessment:        Active Problems:     Chest pain (04/07/2019)        Schizophrenia (HCC) (04/09/2019)        Bipolar 1 disorder, depressed, severe (HCC) (04/09/2019)        Tobacco abuse (04/09/2019)        Alcohol abuse (04/09/2019)              Plan:        Derek Mclaughlin is a 68 yo WM with bipolar disorder, schizophrenia and chest pain. He has moderate [name] with preserved LV systolic function. Medical therapy is warranted, particularly in light of social issues and medical non-compliance.  Agree with aspirin  and statin. Antihypertensives as indicated.      Needs Psychiatric and medical attention. Pain control per medical team.      Thank you for asking us to see Derek Mclaughlin. We will be available if needed further.             Signed By:  Veneda MelterNavin Vinetta Brach, MD           April 09, 2019          Interventional Cardiology   [facility]   [address], [address]   [place], [place] [id number]   P: [phone]   F: [phone]

## 2019-04-09 NOTE — Progress Notes (Signed)
Problem: Diabetes Self-Management  Goal: *Using medications safely  Description: State effect of diabetes medications on diabetes; name diabetes medication taking, action and side effects.  Outcome: Progressing Towards Goal  Note: Patient on ACHS blood sugar checks and has insulin coverage available if needed     Problem: Falls - Risk of  Goal: *Absence of Falls  Description: Document Bridgette Habermann Fall Risk and appropriate interventions in the flowsheet.  Outcome: Progressing Towards Goal  Note: Fall Risk Interventions:            Medication Interventions: Evaluate medications/consider consulting pharmacy, Patient to call before getting OOB, Teach patient to arise slowly                   Problem: Pain  Goal: *Control of Pain  Outcome: Progressing Towards Goal  Note: Patients pain being controlled with prn morphine and rest     Bedside and Verbal shift change report given to Vito Berger, Charity fundraiser (Cabin crew) by Dahlia Client, RN (offgoing nurse). Report included the following information SBAR, Kardex, Intake/Output, MAR, Recent Results, Cardiac Rhythm NSR and Quality Measures.

## 2019-04-09 NOTE — Consults (Signed)
68yo with recent fall. CT of cervical spine shows chronic fracture at T1.  No evidence of acute fracture or malalignment.  No treatment indicated.  Please feel free to reconsult if additional neurosurgical concerns arise.

## 2019-04-09 NOTE — Progress Notes (Signed)
Spiritual Care Assessment/Progress Note  [name]      NAME: Derek Mclaughlin      MRN: [id number]  AGE: 68 y.o. SEX: male  Religious Affiliation: [employer]   Language: English     04/09/2019     Total Time (in minutes): 6     Spiritual Assessment begun in [facility] 4 IMCU through conversation with:         [] Patient        []  Family    []  Friend(s)        Reason for Consult: Rapid response team     Spiritual beliefs: (Please include comment if needed)     []  Identifies with a faith tradition:         []  Supported by a faith community:            []  Claims no spiritual orientation:           []  Seeking spiritual identity:                []  Adheres to an individual form of spirituality:           [x]  Not able to assess:                           Identified resources for coping:      []  Prayer                               []  Music                  []  Guided Imagery     []  Family/friends                 []  Pet visits     []  Devotional reading                         []  Unknown     []  Other:                                           Interventions offered during this visit: (See comments for more details)    Patient Interventions: Crisis           Plan of Care:     []  Support spiritual and/or cultural needs    []  Support AMD and/or advance care planning process      []  Support grieving process   []  Coordinate Rites and/or Rituals    []  Coordination with community clergy   []  No spiritual needs identified at this time   []  Detailed Plan of Care below (See Comments)  []  Make referral to Music Therapy  []  Make referral to Pet Therapy     []  Make referral to Addiction services  []  Make referral to [facility]  []  Make referral to Spiritual Care Partner  []  No future visits requested        []  Follow up visits as needed     Comments: Responded to RRT call for patient.  Patient had fallen and staff was assessing and caring for patient.  Spiritual care is available to provide support to patient as needed/desired.   [phone].     Visit by: Rev. Jennifer L.  Anthonette Legato, D.Min, MA, [facility]    Lead Scientist, clinical (histocompatibility and immunogenetics)

## 2019-04-09 NOTE — Group Note (Signed)
 Diabetes Mgmt by Moises Rosaline LABOR, CNS at 04/09/19 0940                Author: Moises Rosaline LABOR, CNS  Service: Certified Clinical Nurse Specialist  Author Type: Clinical Nurse Specialist       Filed: 04/09/19 0954  Date of Service: 04/09/19 0940  Status: Signed          Editor: Moises Rosaline LABOR, CNS (Clinical Nurse Specialist)               [facility]   CLINICAL NURSE SPECIALIST CONSULT   [facility]      FOLLOW UP  NOTE        Presentation     Derek Mclaughlin is a 68 y.o.  male admitted with chest pain for 1 week accompanied by N/V and SOB with cough . Current clinical course has been complicated by recent bout  of increased depression without SI/ or homicidal along  with hallucinations. He is visiting here from [place]  with the intention of moving in with 'friends' but when he arrived the 'home' was vacant.  He lived in a hotel until money ran out, and now  is homeless. He also has ran out of his medications.        PMH: DM2/ [name]/ HTN/ [name]/ COPD/ ETOH abuse (12 beers daily)/ + cigarette smoker/bipoloar and schizophrenia.      Recent Events: Patient had a fall this morning and stated he hit his head RRT called.  Will go to [place] for head scan. On CIWA protocol.      Diabetes: Patient has known uncontrolled  Type 2 diabetes- A1C 9.1% , treated with Metformin and Lispro insulin  PTA. Family history unknown for diabetes.       Consulted by Provider for advanced diabetes nursing assessment and care, specifically related to       []  Transitioning off Glucostabilizer    [x]  Inpatient management strategy   [x]  Home management assessment   []  Survival skill education      Diabetes-related medical history   Acute complications   hyperglycemia   Neurological complications   TBD   Microvascular disease   TBD   Macrovascular disease   [name]   Other associated conditions      Depression / bipolor/ schizophrenia/HTN/ COPD/ ETOH      Diabetes medication history        Drug class  Currently in use   Discontinued  Never used          Biguanide  Metformin 1000mg  daily              DDP-4 inhibitor                 Sulfonylurea                Thiazolidinedione                GLP-1 RA                SGLT-2 inhibitors                Basal insulin  None listed on PTA?              Fixed Dose  Combinations                Bolus insulin  Lispro TID with meals;   40units with breakfast and lunch   45units with dinner  Subjective      [name] fell this morning and is headed down to [place].           Patient reports the following home diabetes self-care practices:  Eating pattern   Physical activity pattern   []    Monitoring pattern-checked it off and on- 140-390s.  Lost his glucometer, will need a new one.      Taking medications pattern   [x]  In-Consistent administration??   [x]  Affordable      Social determinants of health impacting diabetes self-management practices    Worried that housing situation is unstable, Worried that your food supply will run out before you have money to buy more and Struggling with anxiety and/or depression        Objective     Physical exam   General    Vital Signs    Visit Vitals      BP  155/84     Pulse  85     Temp  97.8 F (36.6 C)     Resp  18     Ht  5' 10 (1.778 m)     Wt  116.9 kg (257 lb 11.2 oz)     SpO2  94%        BMI  36.98 kg/m        Skin  Warm and dry. Acanthosis noted along neckline.    Heart   Regular rate and rhythm. No murmurs, rubs or gallops   Lungs  Clear to auscultation without rales or rhonchi   Extremities No foot wounds     Diabetic foot exam: Vibratory and Filament test: deferred due to clinical condition-patient off floor     Left Foot         Right Foot          Laboratory     Lab Results         Component  Value  Date/Time            Hemoglobin A1c  9.1 (H)  04/07/2019 04:49 PM          Lab Results         Component  Value  Date/Time            LDL, calculated  123.2 (H)  04/08/2019 12:29 AM          Lab Results         Component  Value  Date/Time             Creatinine  0.98  04/09/2019 01:11 AM          Lab Results         Component  Value  Date/Time            Sodium  138  04/09/2019 01:11 AM       Potassium  3.4 (L)  04/09/2019 01:11 AM       Chloride  105  04/09/2019 01:11 AM       CO2  23  04/09/2019 01:11 AM       Anion gap  10  04/09/2019 01:11 AM       Glucose  140 (H)  04/09/2019 01:11 AM       BUN  16  04/09/2019 01:11 AM       Creatinine  0.98  04/09/2019 01:11 AM       BUN/Creatinine ratio  16  04/09/2019 01:11 AM       GFR est AA  >  60  04/09/2019 01:11 AM       GFR est non-AA  >60  04/09/2019 01:11 AM       Calcium  8.2 (L)  04/09/2019 01:11 AM       Bilirubin, total  0.3  04/08/2019 12:29 AM       Alk. phosphatase  76  04/08/2019 12:29 AM       Protein, total  6.7  04/08/2019 12:29 AM       Albumin  2.8 (L)  04/08/2019 12:29 AM       Globulin  3.9  04/08/2019 12:29 AM       A-G Ratio  0.7 (L)  04/08/2019 12:29 AM            ALT (SGPT)  15  04/08/2019 12:29 AM          Lab Results         Component  Value  Date/Time            ALT (SGPT)  15  04/08/2019 12:29 AM           Factors affecting BG pattern       Factor  Dose  Comments         Nutrition:   Carb-controlled meals        60 grams/meal              Drugs:      Other: ETOH     12 beers daily          Blood glucose pattern             Assessment and Plan        Nursing Diagnosis  Risk for unstable blood glucose pattern     Nursing Intervention Domain  5250 Decision-making Support        Nursing Interventions  Examined current inpatient diabetes control    Explored factors facilitating and impeding inpatient management   Identified self-management practices impeding diabetes control   Explored corrective strategies with patient and responsible inpatient provider    Informed patient of rational for insulin strategy while hospitalized          Evaluation     Mr. Aamodt, with uncontrolledType 2 diabetes- A1C 9.1% , has  achieved inpatient blood glucose target of 100-180mg /dl.  AM BG 150mg /dl  today.  BG trended down nicely overnight with  sliding scale.   It will be appropriate to INITIATE insulin subcutaneous protocol  When BG trends>200mg /dl.    Inpatient blood glucose management has been impacted by   []  Kidney dysfunction   [x]  Erratic meal consumption /NPO   []  Glucocorticoid use   [x]  chest pain           Recommendations     1.  Once BG trends >200mg /dl; consider INITIATING insulin subcutaneous order set [facility]):       Basal insulin    [x]  0.2 units/kg/D=  Start 20units daily;       Daily evaluation fasting blood glucose will be required to determine the appropriate amount of Lantus. If fasting blood glucose is over 200, please add the total amount of correctional Humalog  received the day before to total Lantus dose.  Ok to split Lantus BID for larger doses.      2.  ADD mealtime Bolus insulin (if BG remains >200mg /dl despite basal insulin)   [x]  Normal sensitivity=6 units TID with meals         3.  CONTINUE Corrective  insulin   [x]  Normal sensitivity      4. Modify diet to diabetic carb consistent.          Referral    [x]  Behavioral health services  (consulted)   []  Inpatient nutrition services   []  Pharmacy services for medication management   []  Diabetes Self-Management Training through  [facility] (Phone [phone] to schedule appointment)        Billing Code(s)     Thank you for including us  in their care.  I spent 20 minutes in direct patient care today for this patient.  Time includes chart review, face to face with patient and collaboration  with interdisciplinary care team.        Rosaline DELENA Bangs, CNS   [facility]   Access via [employer] (C) [phone]

## 2019-04-10 LAB — BASIC METABOLIC PANEL
Anion Gap: 9 mmol/L (ref 5–15)
BUN: 21 MG/DL — ABNORMAL HIGH (ref 6–20)
Bun/Cre Ratio: 16 (ref 12–20)
CO2: 22 mmol/L (ref 21–32)
Calcium: 8.7 MG/DL (ref 8.5–10.1)
Chloride: 104 mmol/L (ref 97–108)
Creatinine: 1.28 MG/DL (ref 0.70–1.30)
EGFR IF NonAfrican American: 56 mL/min/{1.73_m2} — ABNORMAL LOW (ref >60)
GFR African American: >60 mL/min/{1.73_m2} (ref >60)
Glucose: 195 mg/dL — ABNORMAL HIGH (ref 65–100)
Potassium: 4 mmol/L (ref 3.5–5.1)
Sodium: 135 mmol/L — ABNORMAL LOW (ref 136–145)

## 2019-04-10 LAB — ENTERIC BACTERIA PANEL, DNA
CAMPYLOBACTER SPECIES, DNA: NEGATIVE
Campylobacter species, DNA: NEGATIVE
ENTEROTOXIGEN E COLI, DNA: NEGATIVE
Enterotoxigen E Coli, DNA: NEGATIVE
P. SHIGELLOIDES, DNA: NEGATIVE
P. shigelloides, DNA: NEGATIVE
SALMONELLA SPECIES, DNA: NEGATIVE
SHIGA TOXIN PRODUCING, DNA: NEGATIVE
SHIGELLA SPECIES, DNA: NEGATIVE
Salmonella species, DNA: NEGATIVE
Shiga toxin producing, DNA: NEGATIVE
Shigella species, DNA: NEGATIVE
VIBRIO SPECIES, DNA: NEGATIVE
Vibrio species, DNA: NEGATIVE
Y. ENTEROCOLITICA, DNA: NEGATIVE
Y. enterocolitica, DNA: NEGATIVE

## 2019-04-10 LAB — POCT GLUCOSE
POC Glucose: 168 mg/dL — ABNORMAL HIGH (ref 65–100)
POC Glucose: 198 mg/dL — ABNORMAL HIGH (ref 65–100)
POC Glucose: 162 mg/dL — ABNORMAL HIGH (ref 65–100)
POC Glucose: 195 mg/dL — ABNORMAL HIGH (ref 65–100)

## 2019-04-10 LAB — CULTURE, RESPIRATORY/SPUTUM/BRONCH W GRAM STAIN
Culture result:: NORMAL
Culture: NORMAL

## 2019-04-10 LAB — PROTIME-INR
INR: 1 (ref 0.9–1.1)
Protime: 9.9 s (ref 9.0–11.1)

## 2019-04-10 LAB — FIBRINOGEN
Fibrinogen: 574 mg/dL — ABNORMAL HIGH (ref 200–475)
Fibrinogen: 574 mg/dL — ABNORMAL HIGH (ref 200–475)

## 2019-04-10 LAB — APTT: aPTT: 27.1 s (ref 22.1–32.0)

## 2019-04-10 LAB — D-DIMER, QUANTITATIVE: D-Dimer, Quant: 0.49 mg/L FEU (ref 0.00–0.65)

## 2019-04-10 LAB — WBC, STOOL
White Blood Cells, Stool: 0 /HPF (ref 0–4)
White blood cells, stool: 0 /HPF (ref 0–4)

## 2019-04-10 LAB — GLUCOSE, POC
Glucose (POC): 168 mg/dL — ABNORMAL HIGH (ref 65–100)
Glucose (POC): 198 mg/dL — ABNORMAL HIGH (ref 65–100)
Glucose (POC): 162 mg/dL — ABNORMAL HIGH (ref 65–100)
Glucose (POC): 195 mg/dL — ABNORMAL HIGH (ref 65–100)

## 2019-04-10 LAB — METABOLIC PANEL, BASIC
Anion gap: 9 mmol/L (ref 5–15)
BUN/Creatinine ratio: 16 (ref 12–20)
BUN: 21 MG/DL — ABNORMAL HIGH (ref 6–20)
CO2: 22 mmol/L (ref 21–32)
Calcium: 8.7 MG/DL (ref 8.5–10.1)
Chloride: 104 mmol/L (ref 97–108)
Creatinine: 1.28 MG/DL (ref 0.70–1.30)
GFR est AA: >60 mL/min/{1.73_m2} (ref >60)
GFR est non-AA: 56 mL/min/{1.73_m2} — ABNORMAL LOW (ref >60)
Glucose: 195 mg/dL — ABNORMAL HIGH (ref 65–100)
Potassium: 4 mmol/L (ref 3.5–5.1)
Sodium: 135 mmol/L — ABNORMAL LOW (ref 136–145)

## 2019-04-10 LAB — D DIMER: D-dimer: 0.49 mg/L FEU (ref 0.00–0.65)

## 2019-04-10 LAB — PTT: aPTT: 27.1 s (ref 22.1–32.0)

## 2019-04-10 LAB — PROTHROMBIN TIME + INR
INR: 1 (ref 0.9–1.1)
Prothrombin time: 9.9 s (ref 9.0–11.1)

## 2019-04-10 MED ORDER — OXYCODONE-ACETAMINOPHEN 5 MG-325 MG TAB
5-325 mg | Freq: Three times a day (TID) | ORAL | Status: DC | PRN
Start: 2019-04-10 — End: 2019-04-18
  Administered 2019-04-10 – 2019-04-18 (×23): via ORAL

## 2019-04-10 MED FILL — LORAZEPAM 2 MG/ML IJ SOLN: 2 mg/mL | INTRAMUSCULAR | Qty: 1

## 2019-04-10 MED FILL — CHILDREN'S ASPIRIN 81 MG CHEWABLE TABLET: 81 mg | ORAL | Qty: 1

## 2019-04-10 MED FILL — MORPHINE 2 MG/ML INJECTION: 2 mg/mL | INTRAMUSCULAR | Qty: 1

## 2019-04-10 MED FILL — NORMAL SALINE FLUSH 0.9 % INJECTION SYRINGE: INTRAMUSCULAR | Qty: 10

## 2019-04-10 MED FILL — OXYCODONE-ACETAMINOPHEN 5 MG-325 MG TAB: 5-325 mg | ORAL | Qty: 1

## 2019-04-10 MED FILL — FAMOTIDINE 20 MG TAB: 20 mg | ORAL | Qty: 1

## 2019-04-10 MED FILL — NICOTINE 14 MG/24 HR DAILY PATCH: 14 mg/24 hr | TRANSDERMAL | Qty: 1

## 2019-04-10 MED FILL — INSULIN LISPRO 100 UNIT/ML INJECTION: 100 unit/mL | SUBCUTANEOUS | Qty: 2

## 2019-04-10 MED FILL — INSULIN LISPRO 100 UNIT/ML INJECTION: 100 unit/mL | SUBCUTANEOUS | Qty: 1

## 2019-04-10 MED FILL — INSULIN NPH HUMAN RECOMB 100 UNIT/ML INJECTION: 100 unit/mL | SUBCUTANEOUS | Qty: 1

## 2019-04-10 MED FILL — FOLIC ACID 1 MG TAB: 1 mg | ORAL | Qty: 1

## 2019-04-10 MED FILL — HEPARIN (PORCINE) 5,000 UNIT/ML IJ SOLN: 5000 unit/mL | INTRAMUSCULAR | Qty: 1

## 2019-04-10 MED FILL — LISINOPRIL 20 MG TAB: 20 mg | ORAL | Qty: 1

## 2019-04-10 MED FILL — ATORVASTATIN 40 MG TAB: 40 mg | ORAL | Qty: 1

## 2019-04-10 MED FILL — THERAPEUTIC MULTIVITAMIN TAB: ORAL | Qty: 1

## 2019-04-10 MED FILL — THIAMINE HCL 100 MG TAB: 100 mg | ORAL | Qty: 1

## 2019-04-10 NOTE — Progress Notes (Signed)
Pt asking for Ativan at this time before the scheduled PRN dose is due. This nurse explained to pt the duration of the medications and why it could not be given at this time.

## 2019-04-10 NOTE — Progress Notes (Signed)
Bedside and Verbal shift change report given to Julia (oncoming nurse) by Katie (offgoing nurse). Report included the following information SBAR, Kardex, Intake/Output, MAR and Recent Results.

## 2019-04-10 NOTE — Other (Signed)
Bedside shift change report given to Katie, RN (oncoming nurse) by Kaycee, RN (offgoing nurse). Report included the following information SBAR, Kardex, Intake/Output, MAR and Recent Results.

## 2019-04-10 NOTE — Other (Signed)
[place]  CLINICAL NURSE SPECIALIST CONSULT  [facility]    FOLLOW-UP NOTE    Presentation   Derek Mclaughlin is a 68 y.o. male with a PMH of DM, [name], HTN, [name] s/p stent placement, COPD, Afib, amxiety, MI, schizophrenia and alcohol abuse who presented to the ED with a c/o chest pain for a week with N/V/SOB and cough.  He is visiting from??[place]??and??states he was supposed to living with friends when he got here but the house was vacant when he arrived.??Has lived in [place] until he ran out of money now homeless.??He has been out of medications??for about 1 week prior to coming to ED. Also verbalized he was experiencing auditory and visual hallucinations the past two weeks.  In the ED he was admitted for a cardiac work-up.    Subjective   ???I have no place to go.  I don't have any medications or nothing.???    Diarrhea  Cardiac cath revealed normal LVEDP  Concern for BZD withdrawal   Fall yesterday  Glucose 142-198 in the past 24 hours on 10 units basal with minimal SSI  Objective   Physical exam  General Alert, oriented and in no acute distress/ill-appearing. Conversant and cooperative.  Vital Signs   Visit Vitals  BP 127/85 (BP 1 Location: Left arm, BP Patient Position: Standing)   Pulse 98   Temp 98.3 ??F (36.8 ??C)   Resp 16   Ht 5' 10" (1.778 m)   Wt 116.9 kg (257 lb 11.2 oz)   SpO2 97%   BMI 36.98 kg/m??     Skin  Warm and dry  Heart   Regular rate and rhythm. No murmurs, rubs or gallops  Lungs  Clear to auscultation without rales or rhonchi  Extremities No foot wounds    Laboratory  Lab Results   Component Value Date/Time    Hemoglobin A1c 9.1 (H) 04/07/2019 04:49 PM     Lab Results   Component Value Date/Time    LDL, calculated 123.2 (H) 04/08/2019 12:29 AM     Lab Results   Component Value Date/Time    Creatinine 1.28 04/10/2019 03:14 AM     Lab Results   Component Value Date/Time    Sodium 135 (L) 04/10/2019 03:14 AM    Potassium 4.0 04/10/2019 03:14 AM    Chloride 104 04/10/2019 03:14 AM     CO2 22 04/10/2019 03:14 AM    Anion gap 9 04/10/2019 03:14 AM    Glucose 195 (H) 04/10/2019 03:14 AM    BUN 21 (H) 04/10/2019 03:14 AM    Creatinine 1.28 04/10/2019 03:14 AM    BUN/Creatinine ratio 16 04/10/2019 03:14 AM    GFR est AA >60 04/10/2019 03:14 AM    GFR est non-AA 56 (L) 04/10/2019 03:14 AM    Calcium 8.7 04/10/2019 03:14 AM    Bilirubin, total 0.3 04/08/2019 12:29 AM    Alk. phosphatase 76 04/08/2019 12:29 AM    Protein, total 6.7 04/08/2019 12:29 AM    Albumin 2.8 (L) 04/08/2019 12:29 AM    Globulin 3.9 04/08/2019 12:29 AM    A-G Ratio 0.7 (L) 04/08/2019 12:29 AM    ALT (SGPT) 15 04/08/2019 12:29 AM     Lab Results   Component Value Date/Time    ALT (SGPT) 15 04/08/2019 12:29 AM       Blood glucose pattern      Assessment and Plan   Nursing Diagnosis Risk for unstable blood glucose pattern   Nursing Intervention  Domain 5250 Decision-making Support   Nursing Interventions Examined current inpatient diabetes control   Explored factors facilitating and impeding inpatient management     Evaluation   Derek Mclaughlin is a 68 y.o. male with a PMH of DM, [name], HTN, [name] s/p stent placement, COPD, Afib, amxiety, MI, schizophrenia and alcohol abuse who presented to the ED with a c/o chest pain for a week with N/V/SOB and cough.  He is visiting from??[place]??and??states he was supposed to living with friends when he got here but the house was vacant when he arrived.??Has lived in [place] until he ran out of money now homeless.??His medications and glucometer were lost during his transit to [place].  Unfortunately, due to financial limitations, his diet and food options will be limited on discharge.  At this time, his blood glucose is in goal of 100-180.     Recommendations   Recommend:  1. Continue very low dose basal insulin: 5 units BID and correctional insulin at normal sensitivity    On Discharge:  Given his A1C was 9.1% and cost and resources are a supply concern.     -It would be recommended to resume his PTA metformin 1000 mg daily.    -Would add NPH 5 units BID  -Discontinue Lispro with meals  -Will need to establish a PCP- per case management recommendations   -Will need a new glucometer kit:  Check glucose twice daily (at breakfast and dinner)      Billing Code(s)     Thank you for including [place] in their care.  I spent 40 minutes in direct patient care today for this patient.  Time includes chart review, face to face with patient and collaboration with interdisciplinary care team.      Mittie Bodo, CNS  Access via [place] (C) [phone]

## 2019-04-10 NOTE — Progress Notes (Signed)
TOC:    RUR: 14%    CM met with patient at length to discuss options for discharge. Patient was very tearful, discussed how he had obtained 9 years of sobriety and after a series of unfortunate events, his friend dying, losing his living situation, he relapsed.     Patient is very willing to get sober again and would like assistance with this. Patient stated he has already called his insurance and was informed they can cover this. CM informed him that sometimes the insurance provider will tell the patient one thing, but when rehab facilities communicate with insurance companies, the discussion is different.     Patient would appreciate any help that is provided.     Cm contacted Kyle Donnally with [facility] ([phone]) to obtain assistance. He will call me back with any rehabs that help with this patients' particular insurance.     S. Caitlin Helton BSW, ACM

## 2019-04-10 NOTE — Progress Notes (Addendum)
Problem: Mobility Impaired (Adult and Pediatric)  Goal: *Acute Goals and Plan of Care (Insert Text)  Description: FUNCTIONAL STATUS PRIOR TO ADMISSION: Patient reports that he was modified independent using a rollator for functional mobility. He reports that his rollator was on the bus that he was on as he traveled here to [place] from [place] and the rollator did not make it off the bus. He report that he has called the bus company in an effort to retrieve his rollator. He reports that he bought that rollator outright, did not go through his insurance. When asked, he reports 6 falls in the last 12 months. [facility] from the bathroom.    HOME SUPPORT PRIOR TO ADMISSION: The patient reports that he has a home in [place] and has family living in that home and that he does not know where he is going to live but that he will not be living there, please refer to care management notes.    Physical Therapy Goals  Initiated 04/10/2019  1.  Patient will move from supine to sit and sit to supine , scoot up and down and roll side to side in bed with independence within 7 day(s).    2.  Patient will transfer from bed to chair and chair to bed with modified independence using the least restrictive device within 7 day(s).  3.  Patient will perform sit to stand with modified independence within 7 day(s).  4.  Patient will ambulate with modified independence for 50 feet with the least restrictive device within 7 day(s).   Stairs goal as needed   Outcome: Progressing Towards Goal   PHYSICAL THERAPY EVALUATION  Patient: Derek Mclaughlin ([age] y.o. male)  Date: 04/10/2019  Primary Diagnosis: Chest pain [R07.9]  Chest pain [R07.9]  Procedure(s) (LRB):  LEFT HEART CATH / CORONARY ANGIOGRAPHY (N/A)  Fractional Flow Reserve (N/A) 2 Days Post-Op   Precautions:   Fall, Bed Alarm(enteric)      ASSESSMENT  Based on the objective data described below, the patient presents with  anxious state, some impulsive behavior, distractibility, and need for verbal cues to just slow down and focus on the task at hand. Utilized a walker for the assessment. Pt reports that as he traveled here to [place] from [place] his rollator did not make it off the bus. He reports that he has called the bus company in an effort to retrieve his rollator. Pt was admitted with chest pain and is s/p a cardiac cath and is s/p a fall here. When asked about the fall he reported that he had "passed out". He was received in a seated position and did sit to stand orthostatics and BP stable. He was able to amb 30 feet and remain up to the chair post session.  He reported pain at 8-9 level in his back and chest reporting that this pain is like at his baseline and that his pain has been like this for 6-7 months..He is near his baseline for mobility. Disposition will likely be a challenge, pt reportedly is homeless.    Current Level of Function Impacting Discharge (mobility/balance): provided contact guard    Functional Outcome Measure:  The patient scored 20/28 on the Tinetti outcome measure which is indicative of moderate fall risk but he did loose points for use of his UEs for sit <> stand and for use of an assistive device and he uses one at baseline..Marland Kitchen  Other factors to consider for discharge: ETOH, psych history, homeless, reported that his rollator was left on the bus, might need a new one (as a replacement).        Vitals:      04/10/19 1319 04/10/19 1322   BP:   138/87 127/85   BP 1 Location:   Left arm Left arm   BP Patient Position:   Sitting Standing   Pulse:   (!) 101 98      Patient will benefit from skilled therapy intervention to address the above noted impairments.       PLAN :  Recommendations and Planned Interventions: bed mobility training, transfer training, gait training, therapeutic exercises, patient and family training/education, and therapeutic activities       Frequency/Duration: Patient will be followed by physical therapy:  3 times a week to address goals.    Recommendation for discharge: (in order for the patient to meet his/her long term goals)  No skilled physical therapy/ follow up rehabilitation needs identified at this time.    This discharge recommendation:  A follow-up discussion with the attending provider and/or case management is planned    IF patient discharges home will need the following DME: to be determined (TBD), depending on whether his rollator is found or not.         SUBJECTIVE:   Patient stated ???I can only walk about 30 or 40 feet usually because I have a bad heart and a bad back.Marland Kitchen???    OBJECTIVE DATA SUMMARY:   Consult received, chart reviewed, pt cleared by nursing  HISTORY:    Past Medical History:   Diagnosis Date   ??? A-fib ([place])    ??? Anxiety    ??? Arthritis    ??? [name] (coronary artery disease)    ??? Chronic obstructive pulmonary disease ([place])    ??? Depression    ??? Diabetes ([place])    ??? High cholesterol    ??? Hypertension    ??? MI (myocardial infarction) ([place])    ??? Schizophrenia ([place])      Past Surgical History:   Procedure Laterality Date   ??? HX CORONARY STENT PLACEMENT         Personal factors and/or comorbidities impacting plan of care: homeless    Home Situation  Home Environment: Private residence  One/Two Story Residence: Two story  Living Alone: (reports being homeless)  Support Systems: None  Patient Expects to be Discharged to:: Unknown  Current DME Used/Available at Home: (rollator was on the bus and cane at home)    EXAMINATION/PRESENTATION/DECISION MAKING:   Critical Behavior:  Neurologic State: Alert, Appropriate for age  Orientation Level: Oriented X4     Safety/Judgement: Fall prevention, Decreased awareness of need for safety  Hearing:  Auditory  Auditory Impairment: Hard of hearing, left side  Skin:  refer to MD and nursing notes  Edema: none noted  Range Of Motion:  AROM: Generally decreased, functional                        Strength:    Strength: Generally decreased, functional                    Tone & Sensation:                                  Coordination:     Vision:  Functional Mobility:  Bed Mobility:              Transfers:  Sit to Stand: Contact guard assistance  Stand to Sit: Contact guard assistance                       Balance:   Sitting: Intact;Without support  Standing: Impaired  Standing - Static: Constant support;Good  Ambulation/Gait Training:  Distance (ft): 30 Feet (ft)  Assistive Device: Gait belt;Walker, rolling  Ambulation - Level of Assistance: Contact guard assistance        Gait Abnormalities: Decreased step clearance;Shuffling gait        Base of Support: Widened     Speed/Cadence: Slow  Step Length: Left shortened;Right shortened                     Stairs:              Therapeutic Exercises:       Functional Measure:  Tinetti test:    Sitting Balance: 1  Arises: 1  Attempts to Rise: 2  Immediate Standing Balance: 1  Standing Balance: 1  Nudged: 2  Eyes Closed: 1  Turn 360 Degrees - Continuous/Discontinuous: 1  Turn 360 Degrees - Steady/Unsteady: 1  Sitting Down: 1  Balance Score: 12 Balance total score  Indication of Gait: 1  R Step Length/Height: 1  L Step Length/Height: 1  R Foot Clearance: 1  L Foot Clearance: 1  Step Symmetry: 1  Step Continuity: 1  Path: 1  Trunk: 0  Walking Time: 0  Gait Score: 8 Gait total score  Total Score: 20/28 Overall total score         Tinetti Tool Score Risk of Falls  <19 = High Fall Risk  19-24 = Moderate Fall Risk  25-28 = Low Fall Risk  Tinetti ME. Performance-Oriented Assessment of Mobility Problems in Elderly Patients. JAGS 1986; [id number]. (Scoring Description: PT Bulletin Feb. 10, 1993)    Older adults: Lonn [place](Ko et al, 2009; n = 1000 [place] elderly evaluated with ABC, POMA, ADL, and IADL)  ?? Mean POMA score for males aged 65-79 years = 26.21(3.40)  ?? Mean POMA score for females age 67-79 years = 25.16(4.30)  ?? Mean POMA score for males over 80 years = 23.29(6.02)   ?? Mean POMA score for females over 80 years = 17.20(8.32)           Physical Therapy Evaluation Charge Determination   History Examination Presentation Decision-Making   HIGH Complexity :3+ comorbidities / personal factors will impact the outcome/ POC  LOW Complexity : 1-2 Standardized tests and measures addressing body structure, function, activity limitation and / or participation in recreation  LOW Complexity : Stable, uncomplicated  LOW Complexity : FOTO score of 75-100      Based on the above components, the patient evaluation is determined to be of the following complexity level: LOW     Pain Rating:  8-9, back and chest reporting that he is in pain like at his baseline, has not had pain less than this in 6-7 months.    Activity Tolerance:   Good  Please refer to the flowsheet for vital signs taken during this treatment.    After treatment patient left in no apparent distress:   Sitting in chair, Call bell within reach, and Bed / chair alarm activated    COMMUNICATION/EDUCATION:   The patient???s plan of care was discussed with: Occupational therapist and  Registered nurse.     Fall prevention education was provided and the patient/caregiver indicated understanding., Patient/family have participated as able in goal setting and plan of care., and Patient/family agree to work toward stated goals and plan of care.    Thank you for this referral.  Duwaine Maxinarla C Viray   Time Calculation: 26 mins

## 2019-04-10 NOTE — Progress Notes (Signed)
Problem: Self Care Deficits Care Plan (Adult)  Goal: *Acute Goals and Plan of Care (Insert Text)  Description: FUNCTIONAL STATUS PRIOR TO ADMISSION: Patient was modified independent using a rollator for functional mobility.     HOME SUPPORT: The patient lived with friends, however, reports he is now homeless.    Occupational Therapy Goals  Initiated 04/10/2019  1.  Patient will perform ADLs standing 5 mins without fatigue or LOB independently within 7 day(s).  2.  Patient will perform lower body ADLs with independently within 7 day(s).  3.  Patient will perform gathering ADL items high and low 2/2 with independently within 7 day(s).  4.  Patient will perform toilet transfers with independently within 7 day(s).  5.  Patient will perform all aspects of toileting with independently within 7 day(s).  6.  Patient will participate in upper extremity therapeutic exercise/activities to increase independence with ADLs with supervision/set-up for 5 minutes within 7 day(s).        Outcome: Not Met    OCCUPATIONAL THERAPY EVALUATION  Patient: Derek Mclaughlin (68 y.o. male)  Date: 04/10/2019  Primary Diagnosis: Chest pain [R07.9]  Chest pain [R07.9]  Procedure(s) (LRB):  LEFT HEART CATH / CORONARY ANGIOGRAPHY (N/A)  Fractional Flow Reserve (N/A) 2 Days Post-Op   Precautions:  Contact, Bed Alarm, Fall(enteric)    ASSESSMENT  Based on the objective data described below, the patient presents with near baseline ADLs s/p admission for chest pain. Patient ADLs mildly limited by impaired balance, generalized weakness and decreased functional activity tolerance. Patient with significant psych hx and ETOH use - patient noted to be labile during sessions 2* significant stress about living situation (reporting he is homeless). At baseline, patient reports he is IND with ADLs and IADLs, mod I with mobility using a rollator.     Today, patient received in chair and agreeable to treatment. Patient able  to self feed IND, including opening packages/containers. Patient able to wash face IND. Patient noted to be able to sit EOB and prop LE on edge to donn/doff socks. Per PT report, patient CGA for mobility with RW. Patient left sitting in chair at end of session with call bell in reach, RN aware and all needs met. Will continue to follow, do not anticipate patient will have any further OT needs once medically cleared for D/C.     Current Level of Function Impacting Discharge (ADLs/self-care): up to CGA for mobility, supervision for ADLs    Functional Outcome Measure:  The patient scored 55/100 on the Barthel Index outcome measure which is indicative of moderate deficits in ADLs (~45%).      Other factors to consider for discharge: homeless, limited social support     Patient will benefit from skilled therapy intervention to address the above noted impairments.       PLAN :  Recommendations and Planned Interventions: self care training, functional mobility training, therapeutic exercise, balance training, therapeutic activities, endurance activities, patient education, home safety training, and family training/education    Frequency/Duration: Patient will be followed by occupational therapy 3 times a week to address goals.    Recommendation for discharge: (in order for the patient to meet his/her long term goals)  No skilled occupational therapy/ follow up rehabilitation needs identified at this time.    This discharge recommendation:  Has not yet been discussed the attending provider and/or case management    IF patient discharges home will need the following DME: none       SUBJECTIVE:  Patient stated ???I am a bachelor, I can do all that myself.???    OBJECTIVE DATA SUMMARY:   HISTORY:   Past Medical History:   Diagnosis Date    A-fib ([facility])     Anxiety     Arthritis     [name] (coronary artery disease)     Chronic obstructive pulmonary disease ([facility])     Depression     Diabetes ([facility])     High cholesterol      Hypertension     MI (myocardial infarction) ([place])     Schizophrenia ([place])      Past Surgical History:   Procedure Laterality Date    HX CORONARY STENT PLACEMENT         Expanded or extensive additional review of patient history:     Home Situation  Home Environment: Private residence  One/Two Story Residence: Two story  Living Alone: (reports being homeless)  Support Systems: None  Patient Expects to be Discharged to:: Unknown  Current DME Used/Available at Home: Environmental consultant, Radiation protection practitioner, Radio producer, straight(reports rollator left on bus)  Tub or Shower Type: Tub/Shower combination    Hand dominance: Right    EXAMINATION OF PERFORMANCE DEFICITS:  Cognitive/Behavioral Status:  Neurologic State: Alert  Orientation Level: Oriented X4  Cognition: Appropriate for age attention/concentration;Follows commands  Perception: Appears intact  Perseveration: No perseveration noted  Safety/Judgement: Decreased awareness of need for assistance;Decreased awareness of need for safety;Fall prevention    Skin: appears grossly intact    Edema: none noted in BUEs    Hearing:  Auditory  Auditory Impairment: Hard of hearing, left side    Vision/Perceptual:    Tracking: Able to track stimulus in all quadrants w/o difficulty    Diplopia: No    Acuity: Impaired near vision;Impaired far vision(reports blurriness 2* diabetes)    Corrective Lenses: Reading glasses    Range of Motion:  In BUEs  AROM: Generally decreased, functional    Strength:  In BUEs  Strength: Generally decreased, functional    Coordination:  Coordination: Generally decreased, functional  Fine Motor Skills-Upper: Left Intact;Right Intact    Gross Motor Skills-Upper: Left Intact;Right Intact    Tone & Sensation:  In BUEs  Tone: Normal  Sensation: Impaired(reporting neuropath in B hands and B feet)    Balance:  Sitting: Intact  Standing: Impaired  Standing - Static: Constant support;Good    Functional Mobility and Transfers for ADLs:  Bed Mobility:   NT this session    Transfers:   Sit to Stand: Contact guard assistance  Stand to Sit: Contact guard assistance    ADL Assessment:  Feeding: Independent    Oral Facial Hygiene/Grooming: Supervision;Independent(Infer IND sitting, supervision standing )    Bathing: Supervision(Infer 2* balance )    Upper Body Dressing: Independent    Lower Body Dressing: Supervision(Infer for standing portion of dressing)    Toileting: Supervision(Infer for clothing management)     ADL Intervention and task modifications:  Feeding  Feeding Assistance: Independent  Container Management: Independent  Cutting Food: Independent  Utensil Management: Independent  Food to Mouth: Independent  Drink to Mouth: Independent    Grooming  Position Performed: Seated in chair  Washing Face: Independent    Lower Body Dressing Assistance  Socks: Supervision(props leg on EOB)  Leg Crossed Method Used: No  Position Performed: Seated edge of bed  Cues: Don    Cognitive Retraining  Safety/Judgement: Decreased awareness of need for assistance;Decreased awareness of need for safety;Fall prevention    Functional Measure:  Barthel Index:    Bathing: 0  Bladder: 10  Bowels: 10  Grooming: 5  Dressing: 5  Feeding: 10  Mobility: 0  Stairs: 0  Toilet Use: 5  Transfer (Bed to Chair and Back): 10  Total: 55/100        The Barthel ADL Index: Guidelines  1. The index should be used as a record of what a patient does, not as a record of what a patient could do.  2. The main aim is to establish degree of independence from any help, physical or verbal, however minor and for whatever reason.  3. The need for supervision renders the patient not independent.  4. A patient's performance should be established using the best available evidence. Asking the patient, friends/relatives and nurses are the usual sources, but direct observation and common sense are also important. However direct testing is not needed.  5. Usually the patient's performance over the preceding 24-48 hours is  important, but occasionally longer periods will be relevant.  6. Middle categories imply that the patient supplies over 50 per cent of the effort.  7. Use of aids to be independent is allowed.    Daneen Schick., Barthel, D.W. [phone]). Functional evaluation: the Barthel Index. [place] (14)2.  Lucianne Lei der [place], J.J.M.F, [place], Diona Browner., Oris Drone., [place], [place] (1999). Measuring the change indisability after inpatient rehabilitation; comparison of the responsiveness of the Barthel Index and Functional Independence Measure. Journal of Neurology, Neurosurgery, and Psychiatry, 66(4), [phone].  Wilford Sports, N.J.A, Scholte op [place],  W.J.M, & [place]op[place]chap, M.A. (2004.) Assessment of post-stroke quality of life in cost-effectiveness studies: The usefulness of the Barthel Index and the EuroQoL-5D. Quality of Life Research, 32, [phone]       Occupational Therapy Evaluation Charge Determination   History Examination Decision-Making   LOW Complexity : Brief history review  LOW Complexity : 1-3 performance deficits relating to physical, cognitive , or psychosocial skils that result in activity limitations and / or participation restrictions  LOW Complexity : No comorbidities that affect functional and no verbal or physical assistance needed to complete eval tasks       Based on the above components, the patient evaluation is determined to be of the following complexity level: LOW   Pain Rating:  Reporting no pain during session    Activity Tolerance:   Good  Please refer to the flowsheet for vital signs taken during this treatment.    After treatment patient left in no apparent distress:    Sitting in chair, Call bell within reach, and RN aware     COMMUNICATION/EDUCATION:   The patient???s plan of care was discussed with: Physical therapist and Registered nurse.     Home safety education was provided and the patient/caregiver indicated understanding. and Patient/family have participated as able in goal  setting and plan of care.    This patient???s plan of care is appropriate for delegation to OTA.    Thank you for this referral.  Fidela Salisbury, OT  Time Calculation: 8 mins

## 2019-04-10 NOTE — Progress Notes (Signed)
Pt stating that he feels like the pain medication we are giving him is not helping at all. Pt asking if we can get him a different kind of pain medication in the morning.

## 2019-04-10 NOTE — Progress Notes (Signed)
Pt asking if he can speak with case management today. Pt stating that he has been researching places that he might be able to stay at after discharge and feels like he has found a good fit.

## 2019-04-10 NOTE — Progress Notes (Signed)
[name]                                                                                          Hospitalist Progress Note  Derek RathkeMahmud Jonel Sick, MD  Answering service: [phone] OR [phone] from in house phone        Date of Service:  04/10/2019  NAME:  Derek PummelRobert Mclaughlin  DOB:  [birth year]  MRN:  [id number]      Admission Summary:   Derek Mclaughlinis a 68 y.o.??male??who presents with PMH of DM, [name], HTN, [name], COPD here with c/o chest pain??x1 week and worsening??since last night??with associated??N/V and also having shortness of breath and cough.??He describes pain as??tight??heaviness to left side of chest??and radiates up to neck??and jaw. ??He is visiting from??[place]??and??states he was supposed to living with friends when he got here but the house was vacant when he arrived.??Has lived in motel until he ran out of money now homeless.??He has been out of medications??for about 1 week prior to coming to ED. He??states he has a history of alcohol dependence and??restarted drinking alcohol about 2 weeks??prior to coming to ED??and drinks 12 beers/day. He??states he has a hx of bipolar and schizophrenia and has had an increase in symptoms of depression over last few months, he stated??he doesn't care if he lives but denies suicidal and homicidal ideations.He??states he is??having??auditory and visual??hallucinations??x2 weeks where he hears his Derek Mclaughlin and thinks he has seen her in a room with him.??  Work up in ED: negative CXR, EKG, cardiology consult, ECHO ordered, labs reviewed, psych consult, r/o COVID. 04/07/2019  ??    Interval history / Subjective:   Pt is emotionally labile, he is having lots of diarrhea , he has some hand pain from cath site      Assessment & Plan:     Atypical Chest Pain:??  - likely due to anxiety  -WGN:FAOZHYEKG:Normal sinus rhythm,??Inferior infarct, age undetermined,??RBBB  -troponin negative x 3 negative  -ECHO: EF 55 - 60%. No regional wall motion abnormality noted. Mild (grade  1) left ventricular diastolic dysfunction.  -cardiology on board  - cardiac cath 6/9: Normal LVEDP.   ??  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 Takes Xanax 2mg  tid last filled in May 20th 90 pills but patientstates he lost his medication while travelling here. Last dose was on Thursday 04/03/2019  - IV ativan prn   ??  Severe anxiety/ Panic attacks   C/w Hallucinations: hx of Bipolar and schizophrenia.??  - Auditory and visual hallucinations of Derek wife.??  -denies HI and SI, not on any mood medications except for Xanax  -seen by psyche , recommendation noted but pt does not want those medications as he thinks that makes him weak   - hydroxyzine prn   ??  DM type II with hyperglycemia  - pt states he takes insulin Humalog 50 U with breakfast, 30U with lunch and 50U with dinener  - HGA1c 9  - started on NPH 5U bid. C/w ISS  ??  [name]:   -lipid panel LDL 123  - started on statin  ????  HTN: BP elevated  - not on any meds per pharmacy  - started on lisinopril 20mg  daily    -prn hydralazine  ??  COPD not in exacerbation  -CXR negative  -guaifenesin as needed for cough, albuterol inhaler as needed  - COVID 19 negative ??  - recommend pulmonology and PFT eval as outpt   ??  ETOH Dependence:   - drinks 12 beers/day.  -CIWA protocol  ??  Tobacco Dependence: current smoker 2ppd-1/2ppd.  -not interested in quitting, counseled for cessation  -nicotine patch  ??  Fall 6/10  - CT head: No significant intracranial abnormalities  - CT neck: Avulsion fracture of the tip of the T1 spinous process, probably chronic  - NSGY consult placed   ??  Homelessness  -case management consult for resources    Code status: Full  DVT prophylaxis: SCD    Care Plan discussed with: Patient/Family and Nurse  Anticipated Disposition: Home w/Family  Anticipated Discharge: 24 hours to 48 hours     Hospital Problems  Date Reviewed: 04/09/2019          Codes Class Noted POA    Schizophrenia ([employer]) ICD-10-CM: F20.9  ICD-9-CM: 295.90  04/09/2019 Yes         Bipolar 1 disorder, depressed, severe ([place]) ICD-10-CM: F31.4  ICD-9-CM: 296.53  04/09/2019 Yes        Tobacco abuse ICD-10-CM: Z72.0  ICD-9-CM: 305.1  04/09/2019 Yes        Alcohol abuse ICD-10-CM: F10.10  ICD-9-CM: 305.00  04/09/2019 Yes        Chest pain ICD-10-CM: R07.9  ICD-9-CM: 786.50  04-09-19 Yes                Review of Systems:   A comprehensive review of systems was negative.       Vital Signs:    Last 24hrs VS reviewed since prior progress note. Most recent are:  Visit Vitals  BP 149/70 (BP 1 Location: Left arm, BP Patient Position: At rest)   Pulse 84   Temp 98.3 ??F (36.8 ??C)   Resp 16   Ht 5\' 10"  (1.778 m)   Wt 116.9 kg (257 lb 11.2 oz)   SpO2 97%   BMI 36.98 kg/m??         Intake/Output Summary (Last 24 hours) at 04/10/2019 1143  Last data filed at 04/10/2019 1123  Gross per 24 hour   Intake 240 ml   Output 1205 ml   Net -965 ml        Physical Examination:             Constitutional:  No acute distress, cooperative, pleasant    ENT:  Oral mucosa moist, oropharynx benign.    Resp:  CTA bilaterally. No wheezing/rhonchi/rales. No accessory muscle use   CV:  Regular rhythm, normal rate, no murmurs, gallops, rubs    GI:  Soft, non distended, non tender. normoactive bowel sounds, no hepatosplenomegaly     Musculoskeletal:  No edema, warm, 2+ pulses throughout    Neurologic:  Moves all extremities.  AAOx3, CN II-XII reviewed     Psych:  Good insight, positive anxious nor agitated.       Data Review:    I personally reviewed  Image and Lbs      Labs:     Recent Labs     04/09/19  0111 04/08/19  0029   WBC 10.8 11.0   HGB 14.1 13.8   HCT 43.2 42.5   PLT  244 277     Recent Labs     04/10/19  0314 04/09/19  0111 04/08/19  0029   NA 135* 138 137   K 4.0 3.4* 4.4   CL 104 105 103   CO2 22 23 25    BUN 21* 16 19   CREA 1.28 0.98 1.23   GLU 195* 140* 193*   CA 8.7 8.2* 8.5     Recent Labs     04/08/19  0029   ALT 15   AP 76   TBILI 0.3   TP 6.7   ALB 2.8*   GLOB 3.9     Recent Labs     04/10/19  0314 04/09/19   0111 04/08/19  0029   INR 1.0 1.0 1.0   PTP 9.9 10.3 10.4   APTT 27.1 27.1 26.8      Recent Labs     04/07/19  1649   FERR 131      No results found for: FOL, RBCF   No results for input(s): PH, PCO2, PO2 in the last 72 hours.  Recent Labs     04/08/19  0202 04/07/19  1649   TROIQ <0.05 <0.05     Lab Results   Component Value Date/Time    Cholesterol, total 201 (H) 04/08/2019 12:29 AM    HDL Cholesterol 36 04/08/2019 12:29 AM    LDL, calculated 123.2 (H) 56/21/308606/07/2019 12:29 AM    Triglyceride 209 (H) 04/08/2019 12:29 AM    CHOL/HDL Ratio 5.6 (H) 04/08/2019 12:29 AM     Lab Results   Component Value Date/Time    Glucose (POC) 162 (H) 04/10/2019 11:34 AM    Glucose (POC) 198 (H) 04/10/2019 06:35 AM    Glucose (POC) 168 (H) 04/09/2019 09:17 PM    Glucose (POC) 142 (H) 04/09/2019 04:25 PM    Glucose (POC) 147 (H) 04/09/2019 11:10 AM     Lab Results   Component Value Date/Time    Color YELLOW/STRAW 04/08/2019 02:07 AM    Appearance CLEAR 04/08/2019 02:07 AM    Specific gravity 1.019 04/08/2019 02:07 AM    pH (UA) 5.0 04/08/2019 02:07 AM    Protein 100 (A) 04/08/2019 02:07 AM    Glucose Negative 04/08/2019 02:07 AM    Ketone Negative 04/08/2019 02:07 AM    Bilirubin Negative 04/08/2019 02:07 AM    Urobilinogen 0.2 04/08/2019 02:07 AM    Nitrites Negative 04/08/2019 02:07 AM    Leukocyte Esterase Negative 04/08/2019 02:07 AM    Epithelial cells FEW 04/08/2019 02:07 AM    Bacteria Negative 04/08/2019 02:07 AM    WBC 0-4 04/08/2019 02:07 AM    RBC 50-100 04/08/2019 02:07 AM         Medications Reviewed:     Current Facility-Administered Medications   Medication Dose Route Frequency   ??? oxyCODONE-acetaminophen (PERCOCET) 5-325 mg per tablet 1 Tab  1 Tab Oral Q8H PRN   ??? thiamine HCL (B-1) tablet 100 mg  100 mg Oral DAILY   ??? folic acid (FOLVITE) tablet 1 mg  1 mg Oral DAILY   ??? therapeutic multivitamin (THERAGRAN) tablet 1 Tab  1 Tab Oral DAILY   ??? LORazepam (ATIVAN) injection 1 mg  1 mg IntraVENous Q4H PRN    ??? insulin NPH (NOVOLIN N, HUMULIN N) injection 5 Units  5 Units SubCUTAneous ACB/HS   ??? lisinopriL (PRINIVIL, ZESTRIL) tablet 20 mg  20 mg Oral DAILY   ??? sodium chloride (NS) flush 5-40 mL  5-40  mL IntraVENous Q8H   ??? sodium chloride (NS) flush 5-40 mL  5-40 mL IntraVENous PRN   ??? famotidine (PEPCID) tablet 20 mg  20 mg Oral BID   ??? morphine injection 0.5 mg  0.5 mg IntraVENous Q6H PRN   ??? hydrOXYzine HCL (ATARAX) tablet 25 mg  25 mg Oral TID PRN   ??? atorvastatin (LIPITOR) tablet 40 mg  40 mg Oral QHS   ??? sodium chloride (NS) flush 5-40 mL  5-40 mL IntraVENous Q8H   ??? sodium chloride (NS) flush 5-40 mL  5-40 mL IntraVENous PRN   ??? heparin (porcine) injection 5,000 Units  5,000 Units SubCUTAneous Q8H   ??? prochlorperazine (COMPAZINE) tablet 10 mg  10 mg Oral Q6H PRN   ??? glucose chewable tablet 16 g  4 Tab Oral PRN   ??? glucagon (GLUCAGEN) injection 1 mg  1 mg IntraMUSCular PRN   ??? insulin lispro (HUMALOG) injection   SubCUTAneous AC&HS   ??? acetaminophen (TYLENOL) tablet 650 mg  650 mg Oral Q4H PRN   ??? hydrALAZINE (APRESOLINE) 20 mg/mL injection 20 mg  20 mg IntraVENous Q6H PRN   ??? guaiFENesin (ROBITUSSIN) 100 mg/5 mL oral liquid 100 mg  100 mg Oral Q4H PRN   ??? ondansetron (ZOFRAN) injection 4 mg  4 mg IntraVENous Q4H PRN   ??? nicotine (NICODERM CQ) 14 mg/24 hr patch 1 Patch  1 Patch TransDERmal DAILY   ??? albuterol (PROVENTIL HFA, VENTOLIN HFA, PROAIR HFA) inhaler 2 Puff  2 Puff Inhalation Q4H PRN   ??? aspirin chewable tablet 81 mg  81 mg Oral DAILY     ______________________________________________________________________  EXPECTED LENGTH OF STAY: 2d 14h  ACTUAL LENGTH OF STAY:          2                 Derek RathkeMahmud Yazmyne Sara, MD

## 2019-04-10 NOTE — Progress Notes (Signed)
TOC:    RUR: 14%    CM met with patient at length to discuss options for discharge. Patient was very tearful, discussed how he had obtained 9 years of sobriety and after a series of unfortunate events, his friend dying, losing his living situation, he relapsed.     Patient is very willing to get sober again and would like assistance with this. Patient stated he has already called his insurance and was informed they can cover this. CM informed him that sometimes the insurance provider will tell the patient one thing, but when rehab facilities communicate with insurance companies, the discussion is different.     Patient would appreciate any help that is provided.     Cm contacted Tedra Coupe with [facility] [phone]) to obtain assistance. He will call me back with any rehabs that help with this patients' particular insurance.     S. Caitlin Helton BSW, ACM

## 2019-04-10 NOTE — Progress Notes (Signed)
 Problem: Self Care Deficits Care Plan (Adult)  Goal: *Acute Goals and Plan of Care (Insert Text)  Description: FUNCTIONAL STATUS PRIOR TO ADMISSION: Patient was modified independent using a rollator for functional mobility.     HOME SUPPORT: The patient lived with friends, however, reports he is now homeless.    Occupational Therapy Goals  Initiated 04/10/2019  1.  Patient will perform ADLs standing 5 mins without fatigue or LOB independently within 7 day(s).  2.  Patient will perform lower body ADLs with independently within 7 day(s).  3.  Patient will perform gathering ADL items high and low 2/2 with independently within 7 day(s).  4.  Patient will perform toilet transfers with independently within 7 day(s).  5.  Patient will perform all aspects of toileting with independently within 7 day(s).  6.  Patient will participate in upper extremity therapeutic exercise/activities to increase independence with ADLs with supervision/set-up for 5 minutes within 7 day(s).        Outcome: Not Met    OCCUPATIONAL THERAPY EVALUATION  Patient: Derek Mclaughlin (68 y.o. male)  Date: 04/10/2019  Primary Diagnosis: Chest pain [R07.9]  Chest pain [R07.9]  Procedure(s) (LRB):  LEFT HEART CATH / CORONARY ANGIOGRAPHY (N/A)  Fractional Flow Reserve (N/A) 2 Days Post-Op   Precautions:  Contact, Bed Alarm, Fall(enteric)    ASSESSMENT  Based on the objective data described below, the patient presents with near baseline ADLs s/p admission for chest pain. Patient ADLs mildly limited by impaired balance, generalized weakness and decreased functional activity tolerance. Patient with significant psych hx and ETOH use - patient noted to be labile during sessions 2* significant stress about living situation (reporting he is homeless). At baseline, patient reports he is IND with ADLs and IADLs, mod I with mobility using a rollator.     Today, patient received in chair and agreeable to treatment. Patient able to self feed IND, including opening  packages/containers. Patient able to wash face IND. Patient noted to be able to sit EOB and prop LE on edge to donn/doff socks. Per PT report, patient CGA for mobility with RW. Patient left sitting in chair at end of session with call bell in reach, RN aware and all needs met. Will continue to follow, do not anticipate patient will have any further OT needs once medically cleared for D/C.     Current Level of Function Impacting Discharge (ADLs/self-care): up to CGA for mobility, supervision for ADLs    Functional Outcome Measure:  The patient scored 55/100 on the Barthel Index outcome measure which is indicative of moderate deficits in ADLs (~45%).      Other factors to consider for discharge: homeless, limited social support     Patient will benefit from skilled therapy intervention to address the above noted impairments.       PLAN :  Recommendations and Planned Interventions: self care training, functional mobility training, therapeutic exercise, balance training, therapeutic activities, endurance activities, patient education, home safety training, and family training/education    Frequency/Duration: Patient will be followed by occupational therapy 3 times a week to address goals.    Recommendation for discharge: (in order for the patient to meet his/her long term goals)  No skilled occupational therapy/ follow up rehabilitation needs identified at this time.    This discharge recommendation:  Has not yet been discussed the attending provider and/or case management    IF patient discharges home will need the following DME: none       SUBJECTIVE:  Patient stated "I am a bachelor, I can do all that myself."    OBJECTIVE DATA SUMMARY:   HISTORY:   Past Medical History:   Diagnosis Date    A-fib (HCC)     Anxiety     Arthritis     [name] (coronary artery disease)     Chronic obstructive pulmonary disease (HCC)     Depression     Diabetes (HCC)     High cholesterol     Hypertension     MI (myocardial infarction) (HCC)      Schizophrenia (HCC)      Past Surgical History:   Procedure Laterality Date    HX CORONARY STENT PLACEMENT         Expanded or extensive additional review of patient history:     Home Situation  Home Environment: Private residence  One/Two Story Residence: Two story  Living Alone: (reports being homeless)  Support Systems: None  Patient Expects to be Discharged to:: Unknown  Current DME Used/Available at Home: Environmental consultant, Occupational hygienist, Medical laboratory scientific officer, straight(reports rollator left on bus)  Tub or Shower Type: Tub/Shower combination    Hand dominance: Right    EXAMINATION OF PERFORMANCE DEFICITS:  Cognitive/Behavioral Status:  Neurologic State: Alert  Orientation Level: Oriented X4  Cognition: Appropriate for age attention/concentration;Follows commands  Perception: Appears intact  Perseveration: No perseveration noted  Safety/Judgement: Decreased awareness of need for assistance;Decreased awareness of need for safety;Fall prevention    Skin: appears grossly intact    Edema: none noted in BUEs    Hearing:  Auditory  Auditory Impairment: Hard of hearing, left side    Vision/Perceptual:    Tracking: Able to track stimulus in all quadrants w/o difficulty    Diplopia: No    Acuity: Impaired near vision;Impaired far vision(reports blurriness 2* diabetes)    Corrective Lenses: Reading glasses    Range of Motion:  In BUEs  AROM: Generally decreased, functional    Strength:  In BUEs  Strength: Generally decreased, functional    Coordination:  Coordination: Generally decreased, functional  Fine Motor Skills-Upper: Left Intact;Right Intact    Gross Motor Skills-Upper: Left Intact;Right Intact    Tone & Sensation:  In BUEs  Tone: Normal  Sensation: Impaired(reporting neuropath in B hands and B feet)    Balance:  Sitting: Intact  Standing: Impaired  Standing - Static: Constant support;Good    Functional Mobility and Transfers for ADLs:  Bed Mobility:   NT this session    Transfers:  Sit to Stand: Contact guard assistance  Stand to Sit:  Contact guard assistance    ADL Assessment:  Feeding: Independent    Oral Facial Hygiene/Grooming: Supervision;Independent(Infer IND sitting, supervision standing )    Bathing: Supervision(Infer 2* balance )    Upper Body Dressing: Independent    Lower Body Dressing: Supervision(Infer for standing portion of dressing)    Toileting: Supervision(Infer for clothing management)     ADL Intervention and task modifications:  Feeding  Feeding Assistance: Independent  Container Management: Independent  Cutting Food: Independent  Utensil Management: Independent  Food to Mouth: Independent  Drink to Mouth: Independent    Grooming  Position Performed: Seated in chair  Washing Face: Independent    Lower Body Dressing Assistance  Socks: Supervision(props leg on EOB)  Leg Crossed Method Used: No  Position Performed: Seated edge of bed  Cues: Don    Cognitive Retraining  Safety/Judgement: Decreased awareness of need for assistance;Decreased awareness of need for safety;Fall prevention    Functional Measure:  Barthel Index:    Bathing: 0  Bladder: 10  Bowels: 10  Grooming: 5  Dressing: 5  Feeding: 10  Mobility: 0  Stairs: 0  Toilet Use: 5  Transfer (Bed to Chair and Back): 10  Total: 55/100        The Barthel ADL Index: Guidelines  1. The index should be used as a record of what a patient does, not as a record of what a patient could do.  2. The main aim is to establish degree of independence from any help, physical or verbal, however minor and for whatever reason.  3. The need for supervision renders the patient not independent.  4. A patient's performance should be established using the best available evidence. Asking the patient, friends/relatives and nurses are the usual sources, but direct observation and common sense are also important. However direct testing is not needed.  5. Usually the patient's performance over the preceding 24-48 hours is important, but occasionally longer periods will be relevant.  6. Middle categories  imply that the patient supplies over 50 per cent of the effort.  7. Use of aids to be independent is allowed.    Henriette Desanctis., Barthel, D.W. [phone]). Functional evaluation: the Barthel Index. Md State Med J (14)2.  Fleeta der [place], J.J.M.F, [place], DAIVD., [place], [place]. (1999). Measuring the change indisability after inpatient rehabilitation; comparison of the responsiveness of the Barthel Index and Functional Independence Measure. Journal of Neurology, Neurosurgery, and Psychiatry, 66(4), [phone].  Fleeta Chillington, N.J.A, [place],  W.J.M, & Koopmanschap, M.A. (2004.) Assessment of post-stroke quality of life in cost-effectiveness studies: The usefulness of the Barthel Index and the EuroQoL-5D. Quality of Life Research, 73, 572-56       Occupational Therapy Evaluation Charge Determination   History Examination Decision-Making   LOW Complexity : Brief history review  LOW Complexity : 1-3 performance deficits relating to physical, cognitive , or psychosocial skils that result in activity limitations and / or participation restrictions  LOW Complexity : No comorbidities that affect functional and no verbal or physical assistance needed to complete eval tasks       Based on the above components, the patient evaluation is determined to be of the following complexity level: LOW   Pain Rating:  Reporting no pain during session    Activity Tolerance:   Good  Please refer to the flowsheet for vital signs taken during this treatment.    After treatment patient left in no apparent distress:    Sitting in chair, Call bell within reach, and RN aware     COMMUNICATION/EDUCATION:   The patient's plan of care was discussed with: Physical therapist and Registered nurse.     Home safety education was provided and the patient/caregiver indicated understanding. and Patient/family have participated as able in goal setting and plan of care.    This patient's plan of care is appropriate for delegation to OTA.    Thank you  for this referral.  Laymon LITTIE Ards, OT  Time Calculation: 8 mins

## 2019-04-10 NOTE — Progress Notes (Signed)
Pt asking for Ativan at this time before the scheduled PRN dose is due. This nurse explained to pt the duration of the medications and why it could not be given at this time.

## 2019-04-10 NOTE — Progress Notes (Signed)
Pt stating that he feels like the pain medication we are giving him is not helping at all. Pt asking if we can get him a different kind of pain medication in the morning.

## 2019-04-10 NOTE — Progress Notes (Signed)
Bedside and Verbal shift change report given to Amil Amen (Cabin crew) by Florentina Addison (offgoing nurse). Report included the following information SBAR, Kardex, Intake/Output, MAR and Recent Results.

## 2019-04-10 NOTE — Progress Notes (Signed)
Progress Notes by Lucky RathkeUzzaman, Takelia Urieta, MD at 04/10/19 1143                Author: Lucky RathkeUzzaman, Aster Screws, MD  Service: Internal Medicine  Author Type: Physician       Filed: 04/10/19 1147  Date of Service: 04/10/19 1143  Status: Signed          Editor: Lucky RathkeUzzaman, Kitara Hebb, MD (Physician)                    [name]                                                                                              Hospitalist Progress Note   Lucky RathkeMahmud Asani Mcburney, MD   Answering service: [phone] OR [phone] from in house phone            Date of Service:  04/10/2019   NAME:  Derek PummelRobert Mclaughlin   DOB:  07/10/1951   MRN:  [id number]           Admission Summary:        Derek MaduroRobert Rohleder??is a 68 y.o.??male??who presents with PMH of DM, [name], HTN, [name], [name] ??He is visiting from??[place]??and??states he was supposed to living with friends when  he got here but the house was vacant when he arrived.??Has lived in motel until he ran out of money now homeless.??He has been out of medications??for about 1 week prior to coming to ED. He??states he has a history of alcohol dependence and??restarted  drinking alcohol about 2 weeks??prior to coming to ED??and drinks 12 beers/day. He??states he has a hx of bipolar and schizophrenia and has had an increase in symptoms of depression over last few months, he stated??he doesn't care if he lives  but denies suicidal and homicidal ideations.He??states he is??having??auditory and visual??hallucinations??x2 weeks where he hears his deceased wifes voice and thinks he has seen her in a room with him.??   Work up in ED: negative CXR, EKG, cardiology consult, ECHO ordered, labs reviewed, psych consult, r/o COVID. 04/07/2019   ??      Interval history / Subjective:        Pt is  emotionally labile, he is having lots of diarrhea , he has some hand pain from cath site           Assessment & Plan:          Atypical Chest Pain:??   - likely due to anxiety   -LKG:MWNUUVEKG:Normal sinus rhythm,??Inferior infarct, age undetermined,??RBBB   -troponin negative x 3 negative   -ECHO: EF 55 - 60%. No regional wall motion abnormality noted. Mild (grade 1) left ventricular diastolic dysfunction.   -cardiology on board   - cardiac cath 6/9: Normal LVEDP.    ??   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 Takes Xanax 2mg  tid last filled in May 20th 90 pills  but patientstates he lost his medication while travelling here. Last dose was on Thursday 04/03/2019   - IV ativan prn    ??   Severe anxiety/ Panic attacks    C/w Hallucinations: hx of Bipolar and schizophrenia.??   - Auditory and visual hallucinations of deceased wife.??   -denies HI and SI, not on any mood medications except for Xanax   -seen by psyche , recommendation noted but pt does not want those medications as he thinks that makes him weak    - hydroxyzine prn    ??   DM type II with hyperglycemia   - pt states he takes insulin Humalog 50 U with breakfast, 30U with lunch and 50U with dinener   - HGA1c 9   - started on NPH 5U bid. C/w ISS   ??   [name]:    -lipid panel LDL 123   - started on statin   ????   HTN: BP elevated   - not on any meds per pharmacy   - started on lisinopril 20mg  daily     -prn hydralazine   ??   COPD not in exacerbation   -CXR negative   -guaifenesin as needed for cough, albuterol inhaler as needed   - COVID 19 negative ??   - recommend pulmonology and PFT eval as outpt    ??   ETOH Dependence:    - drinks 12 beers/day.   -CIWA protocol   ??   Tobacco Dependence: current smoker 2ppd-1/2ppd.   -not interested in quitting, counseled for cessation   -nicotine patch   ??   Fall 6/10   - CT head: No significant intracranial abnormalities   - CT neck: Avulsion fracture of the tip of the T1 spinous process, probably chronic   - NSGY consult placed     ??   Homelessness   -case management consult for resources      Code status: Full   DVT prophylaxis: SCD      Care Plan discussed with: Patient/Family and Nurse   Anticipated Disposition: Home w/Family   Anticipated Discharge: 24 hours to 48 hours           Hospital Problems   Date Reviewed:  04/07/2019                         Codes  Class  Noted  POA              Schizophrenia ([place])  ICD-10-CM: F20.9   ICD-9-CM: 295.90    04/09/2019  Yes                        Bipolar 1 disorder, depressed, severe ([place])  ICD-10-CM: F31.4   ICD-9-CM: 296.53    04/09/2019  Yes                        Tobacco abuse  ICD-10-CM: Z72.0   ICD-9-CM: 305.1    04/09/2019  Yes                        Alcohol abuse  ICD-10-CM: F10.10   ICD-9-CM: 305.00    04/09/2019  Yes                        Chest pain  ICD-10-CM: R07.9   ICD-9-CM: 786.50    04/07/2019  Yes  Review of Systems:     A comprehensive review of systems was negative.            Vital Signs:      Last 24hrs VS reviewed since prior progress note. Most recent are:   Visit Vitals      BP  149/70 (BP 1 Location: Left arm, BP Patient Position: At rest)     Pulse  84     Temp  98.3 ??F (36.8 ??C)     Resp  16     Ht  5\' 10"  (1.778 m)     Wt  116.9 kg (257 lb 11.2 oz)     SpO2  97%        BMI  36.98 kg/m??              Intake/Output Summary (Last 24 hours) at 04/10/2019 1143   Last data filed at 04/10/2019 1123     Gross per 24 hour        Intake  240 ml        Output  1205 ml        Net  -965 ml              Physical Examination:                     Constitutional:   No acute distress, cooperative, pleasant      ENT:   Oral mucosa moist, oropharynx benign.      Resp:   CTA bilaterally. No wheezing/rhonchi/rales. No accessory muscle use     CV:   Regular rhythm, normal rate, no murmurs, gallops, rubs         GI:   Soft, non distended, non tender. normoactive bowel sounds, no hepatosplenomegaly          Musculoskeletal:   No edema, warm, 2+ pulses throughout          Neurologic:   Moves all extremities.  AAOx3, CN II-XII reviewed       Psych:  Good insight, positive anxious nor agitated.               Data Review:      I personally reviewed  Image and Lbs           Labs:          Recent Labs            04/09/19   0111  04/08/19   0029     WBC  10.8  11.0     HGB  14.1  13.8     HCT  43.2  42.5         PLT  244  277          Recent Labs             04/10/19   0314  04/09/19   0111  04/08/19   0029     NA  135*  138  137     K  4.0  3.4*  4.4     CL  104  105  103     CO2  22  23  25      BUN  21*  16  19     CREA  1.28  0.98  1.23     GLU  195*  140*  193*          CA  8.7  8.2*  8.5  Recent Labs           04/08/19   0029     ALT  15     AP  76     TBILI  0.3     TP  6.7     ALB  2.8*        GLOB  3.9          Recent Labs             04/10/19   0314  04/09/19   0111  04/08/19   0029     INR  1.0  1.0  1.0     PTP  9.9  10.3  10.4          APTT  27.1  27.1  26.8           Recent Labs           04/07/19   1649        FERR  131         No results found for: FOL, RBCF    No results for input(s): PH, PCO2, PO2 in the last 72 hours.     Recent Labs            04/08/19   0202  04/07/19   1649         TROIQ  <0.05  <0.05          Lab Results         Component  Value  Date/Time            Cholesterol, total  201 (H)  04/08/2019 12:29 AM       HDL Cholesterol  36  04/08/2019 12:29 AM       LDL, calculated  123.2 (H)  04/08/2019 12:29 AM       Triglyceride  209 (H)  04/08/2019 12:29 AM            CHOL/HDL Ratio  5.6 (H)  04/08/2019 12:29 AM          Lab Results         Component  Value  Date/Time            Glucose (POC)  162 (H)  04/10/2019 11:34 AM       Glucose (POC)  198 (H)  04/10/2019 06:35 AM       Glucose (POC)  168 (H)  04/09/2019 09:17 PM       Glucose (POC)  142 (H)  04/09/2019 04:25 PM            Glucose (POC)  147 (H)  04/09/2019 11:10 AM          Lab Results         Component  Value  Date/Time            Color  YELLOW/STRAW  04/08/2019 02:07 AM       Appearance  CLEAR   04/08/2019 02:07 AM       Specific gravity  1.019  04/08/2019 02:07 AM       pH (UA)  5.0  04/08/2019 02:07 AM       Protein  100 (A)  04/08/2019 02:07 AM       Glucose  Negative  04/08/2019 02:07 AM       Ketone  Negative  04/08/2019 02:07 AM       Bilirubin  Negative  04/08/2019 02:07 AM       Urobilinogen  0.2  04/08/2019 02:07 AM       Nitrites  Negative  04/08/2019 02:07 AM       Leukocyte Esterase  Negative  04/08/2019 02:07 AM       Epithelial cells  FEW  04/08/2019 02:07 AM       Bacteria  Negative  04/08/2019 02:07 AM       WBC  0-4  04/08/2019 02:07 AM            RBC  50-100  04/08/2019 02:07 AM                Medications Reviewed:          Current Facility-Administered Medications          Medication  Dose  Route  Frequency           ?  oxyCODONE-acetaminophen (PERCOCET) 5-325 mg per tablet 1 Tab   1 Tab  Oral  Q8H PRN     ?  thiamine HCL (B-1) tablet 100 mg   100 mg  Oral  DAILY     ?  folic acid (FOLVITE) tablet 1 mg   1 mg  Oral  DAILY     ?  therapeutic multivitamin (THERAGRAN) tablet 1 Tab   1 Tab  Oral  DAILY     ?  LORazepam (ATIVAN) injection 1 mg   1 mg  IntraVENous  Q4H PRN     ?  insulin NPH (NOVOLIN N, HUMULIN N) injection 5 Units   5 Units  SubCUTAneous  ACB/HS     ?  lisinopriL (PRINIVIL, ZESTRIL) tablet 20 mg   20 mg  Oral  DAILY     ?  sodium chloride (NS) flush 5-40 mL   5-40 mL  IntraVENous  Q8H     ?  sodium chloride (NS) flush 5-40 mL   5-40 mL  IntraVENous  PRN     ?  famotidine (PEPCID) tablet 20 mg   20 mg  Oral  BID     ?  morphine injection 0.5 mg   0.5 mg  IntraVENous  Q6H PRN     ?  hydrOXYzine HCL (ATARAX) tablet 25 mg   25 mg  Oral  TID PRN     ?  atorvastatin (LIPITOR) tablet 40 mg   40 mg  Oral  QHS     ?  sodium chloride (NS) flush 5-40 mL   5-40 mL  IntraVENous  Q8H     ?  sodium chloride (NS) flush 5-40 mL   5-40 mL  IntraVENous  PRN     ?  heparin (porcine) injection 5,000 Units   5,000 Units  SubCUTAneous  Q8H           ?  prochlorperazine (COMPAZINE) tablet 10 mg   10  mg  Oral  Q6H PRN           ?  glucose chewable tablet 16 g   4 Tab  Oral  PRN     ?  glucagon (GLUCAGEN) injection 1 mg   1 mg  IntraMUSCular  PRN     ?  insulin lispro (HUMALOG) injection     SubCUTAneous  AC&HS     ?  acetaminophen (TYLENOL) tablet 650 mg   650 mg  Oral  Q4H PRN     ?  hydrALAZINE (APRESOLINE) 20 mg/mL injection 20 mg   20 mg  IntraVENous  Q6H PRN     ?  guaiFENesin (ROBITUSSIN) 100 mg/5 mL oral liquid 100 mg   100 mg  Oral  Q4H PRN     ?  ondansetron (ZOFRAN) injection 4 mg   4 mg  IntraVENous  Q4H PRN     ?  nicotine (NICODERM CQ) 14 mg/24 hr patch 1 Patch   1 Patch  TransDERmal  DAILY     ?  albuterol (PROVENTIL HFA, VENTOLIN HFA, PROAIR HFA) inhaler 2 Puff   2 Puff  Inhalation  Q4H PRN           ?  aspirin chewable tablet 81 mg   81 mg  Oral  DAILY        ______________________________________________________________________   EXPECTED LENGTH OF STAY: 2d 14h   ACTUAL LENGTH OF STAY:          2                    Lucky RathkeMahmud Wardell Pokorski, MD

## 2019-04-10 NOTE — Progress Notes (Signed)
 Problem: Mobility Impaired (Adult and Pediatric)  Goal: *Acute Goals and Plan of Care (Insert Text)  Description: FUNCTIONAL STATUS PRIOR TO ADMISSION: Patient reports that he was modified independent using a rollator for functional mobility. He reports that his rollator was on the bus that he was on as he traveled here to [place] from [place]  and the rollator did not make it off the bus. He report that he has called the bus company in an effort to retrieve his rollator. He reports that he bought that rollator outright, did not go through his insurance. When asked, he reports 6 falls in the last 12 months. He had a fall here at [facility] this admission amb back from the bathroom.    HOME SUPPORT PRIOR TO ADMISSION: The patient reports that he has a home in [place] and has family living in that home and that he does not know where he is going to live but that he will not be living there, please refer to care management notes.    Physical Therapy Goals  Initiated 04/10/2019  1.  Patient will move from supine to sit and sit to supine , scoot up and down and roll side to side in bed with independence within 7 day(s).    2.  Patient will transfer from bed to chair and chair to bed with modified independence using the least restrictive device within 7 day(s).  3.  Patient will perform sit to stand with modified independence within 7 day(s).  4.  Patient will ambulate with modified independence for 50 feet with the least restrictive device within 7 day(s).   Stairs goal as needed   Outcome: Progressing Towards Goal   PHYSICAL THERAPY EVALUATION  Patient: Derek Mclaughlin (68 y.o. male)  Date: 04/10/2019  Primary Diagnosis: Chest pain [R07.9]  Chest pain [R07.9]  Procedure(s) (LRB):  LEFT HEART CATH / CORONARY ANGIOGRAPHY (N/A)  Fractional Flow Reserve (N/A) 2 Days Post-Op   Precautions:   Fall, Bed Alarm(enteric)      ASSESSMENT  Based on the objective data described below, the patient presents with anxious state, some  impulsive behavior, distractibility, and need for verbal cues to just slow down and focus on the task at hand. Utilized a walker for the assessment. Pt reports that as he traveled here to [facility] from [place]  his rollator did not make it off the bus. He reports that he has called the bus company in an effort to retrieve his rollator. Pt was admitted with chest pain and is s/p a cardiac cath and is s/p a fall here. When asked about the fall he reported that he had passed out. He was received in a seated position and did sit to stand orthostatics and BP stable. He was able to amb 30 feet and remain up to the chair post session.  He reported pain at 8-9 level in his back and chest reporting that this pain is like at his baseline and that his pain has been like this for 6-7 months..He is near his baseline for mobility. Disposition will likely be a challenge, pt reportedly is homeless.    Current Level of Function Impacting Discharge (mobility/balance): provided contact guard    Functional Outcome Measure:  The patient scored 20/28 on the Tinetti outcome measure which is indicative of moderate fall risk but he did loose points for use of his UEs for sit <> stand and for use of an assistive device and he uses one at baseline.SABRA  Other factors to consider for discharge: ETOH, psych history, homeless, reported that his rollator was left on the bus, might need a new one (as a replacement).        Vitals:      04/10/19 1319 04/10/19 1322   BP:   138/87 127/85   BP 1 Location:   Left arm Left arm   BP Patient Position:   Sitting Standing   Pulse:   (!) 101 98      Patient will benefit from skilled therapy intervention to address the above noted impairments.       PLAN :  Recommendations and Planned Interventions: bed mobility training, transfer training, gait training, therapeutic exercises, patient and family training/education, and therapeutic activities      Frequency/Duration: Patient will be followed by physical  therapy:  3 times a week to address goals.    Recommendation for discharge: (in order for the patient to meet his/her long term goals)  No skilled physical therapy/ follow up rehabilitation needs identified at this time.    This discharge recommendation:  A follow-up discussion with the attending provider and/or case management is planned    IF patient discharges home will need the following DME: to be determined (TBD), depending on whether his rollator is found or not.         SUBJECTIVE:   Patient stated "I can only walk about 30 or 40 feet usually because I have a bad heart and a bad back.."    OBJECTIVE DATA SUMMARY:   Consult received, chart reviewed, pt cleared by nursing  HISTORY:    Past Medical History:   Diagnosis Date   . A-fib (HCC)    . Anxiety    . Arthritis    . [name] (coronary artery disease)    . Chronic obstructive pulmonary disease (HCC)    . Depression    . Diabetes (HCC)    . High cholesterol    . Hypertension    . MI (myocardial infarction) (HCC)    . Schizophrenia [facility])      Past Surgical History:   Procedure Laterality Date   . HX CORONARY STENT PLACEMENT         Personal factors and/or comorbidities impacting plan of care: homeless    Home Situation  Home Environment: Private residence  One/Two Story Residence: Two story  Living Alone: (reports being homeless)  Support Systems: None  Patient Expects to be Discharged to:: Unknown  Current DME Used/Available at Home: (rollator was on the bus and cane at home)    EXAMINATION/PRESENTATION/DECISION MAKING:   Critical Behavior:  Neurologic State: Alert, Appropriate for age  Orientation Level: Oriented X4     Safety/Judgement: Fall prevention, Decreased awareness of need for safety  Hearing:  Auditory  Auditory Impairment: Hard of hearing, left side  Skin:  refer to MD and nursing notes  Edema: none noted  Range Of Motion:  AROM: Generally decreased, functional                       Strength:    Strength: Generally decreased, functional                     Tone & Sensation:                                  Coordination:     Vision:  Functional Mobility:  Bed Mobility:              Transfers:  Sit to Stand: Contact guard assistance  Stand to Sit: Contact guard assistance                       Balance:   Sitting: Intact;Without support  Standing: Impaired  Standing - Static: Constant support;Good  Ambulation/Gait Training:  Distance (ft): 30 Feet (ft)  Assistive Device: Gait belt;Walker, rolling  Ambulation - Level of Assistance: Contact guard assistance        Gait Abnormalities: Decreased step clearance;Shuffling gait        Base of Support: Widened     Speed/Cadence: Slow  Step Length: Left shortened;Right shortened                     Stairs:              Therapeutic Exercises:       Functional Measure:  Tinetti test:    Sitting Balance: 1  Arises: 1  Attempts to Rise: 2  Immediate Standing Balance: 1  Standing Balance: 1  Nudged: 2  Eyes Closed: 1  Turn 360 Degrees - Continuous/Discontinuous: 1  Turn 360 Degrees - Steady/Unsteady: 1  Sitting Down: 1  Balance Score: 12 Balance total score  Indication of Gait: 1  R Step Length/Height: 1  L Step Length/Height: 1  R Foot Clearance: 1  L Foot Clearance: 1  Step Symmetry: 1  Step Continuity: 1  Path: 1  Trunk: 0  Walking Time: 0  Gait Score: 8 Gait total score  Total Score: 20/28 Overall total score         Tinetti Tool Score Risk of Falls  <19 = High Fall Risk  19-24 = Moderate Fall Risk  25-28 = Low Fall Risk  Tinetti ME. Performance-Oriented Assessment of Mobility Problems in Elderly Patients. JAGS 1986; [id number]. (Scoring Description: PT Bulletin Feb. 10, 1993)    Older adults: Gaines et al, 2009; n = 1000 [place] elderly evaluated with ABC, POMA, ADL, and IADL)   Mean POMA score for males aged 65-79 years = 26.21(3.40)   Mean POMA score for females age 90-79 years = 25.16(4.30)   Mean POMA score for males over 80 years = 23.29(6.02)   Mean POMA score for females over 80 years = 17.20(8.32)            Physical Therapy Evaluation Charge Determination   History Examination Presentation Decision-Making   HIGH Complexity :3+ comorbidities / personal factors will impact the outcome/ POC  LOW Complexity : 1-2 Standardized tests and measures addressing body structure, function, activity limitation and / or participation in recreation  LOW Complexity : Stable, uncomplicated  LOW Complexity : FOTO score of 75-100      Based on the above components, the patient evaluation is determined to be of the following complexity level: LOW     Pain Rating:  8-9, back and chest reporting that he is in pain like at his baseline, has not had pain less than this in 6-7 months.    Activity Tolerance:   Good  Please refer to the flowsheet for vital signs taken during this treatment.    After treatment patient left in no apparent distress:   Sitting in chair, Call bell within reach, and Bed / chair alarm activated    COMMUNICATION/EDUCATION:   The patient's plan of care was discussed with: Occupational therapist and  Registered nurse.     Fall prevention education was provided and the patient/caregiver indicated understanding., Patient/family have participated as able in goal setting and plan of care., and Patient/family agree to work toward stated goals and plan of care.    Thank you for this referral.  Tobias JAYSON [place]   Time Calculation: 26 mins
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
## 2019-04-10 NOTE — Progress Notes (Signed)
Pt asking if he can speak with case management today. Pt stating that he has been researching places that he might be able to stay at after discharge and feels like he has found a good fit.

## 2019-04-11 LAB — PROTIME-INR
INR: 1 (ref 0.9–1.1)
Protime: 10.4 s (ref 9.0–11.1)

## 2019-04-11 LAB — POCT GLUCOSE
POC Glucose: 178 mg/dL — ABNORMAL HIGH (ref 65–100)
POC Glucose: 241 mg/dL — ABNORMAL HIGH (ref 65–100)
POC Glucose: 233 mg/dL — ABNORMAL HIGH (ref 65–100)
POC Glucose: 145 mg/dL — ABNORMAL HIGH (ref 65–100)

## 2019-04-11 LAB — D-DIMER, QUANTITATIVE: D-Dimer, Quant: 0.44 mg/L FEU (ref 0.00–0.65)

## 2019-04-11 LAB — APTT: aPTT: 27.3 s (ref 22.1–32.0)

## 2019-04-11 LAB — FIBRINOGEN
Fibrinogen: 548 mg/dL — ABNORMAL HIGH (ref 200–475)
Fibrinogen: 548 mg/dL — ABNORMAL HIGH (ref 200–475)

## 2019-04-11 LAB — SAMPLES BEING HELD

## 2019-04-11 LAB — GLUCOSE, POC
Glucose (POC): 178 mg/dL — ABNORMAL HIGH (ref 65–100)
Glucose (POC): 241 mg/dL — ABNORMAL HIGH (ref 65–100)
Glucose (POC): 233 mg/dL — ABNORMAL HIGH (ref 65–100)
Glucose (POC): 145 mg/dL — ABNORMAL HIGH (ref 65–100)

## 2019-04-11 LAB — PTT: aPTT: 27.3 s (ref 22.1–32.0)

## 2019-04-11 LAB — PROTHROMBIN TIME + INR
INR: 1 (ref 0.9–1.1)
Prothrombin time: 10.4 s (ref 9.0–11.1)

## 2019-04-11 LAB — D DIMER: D-dimer: 0.44 mg/L FEU (ref 0.00–0.65)

## 2019-04-11 MED ORDER — INSULIN NPH HUMAN RECOMB 100 UNIT/ML INJECTION
100 unit/mL | Freq: Two times a day (BID) | SUBCUTANEOUS | Status: DC
Start: 2019-04-11 — End: 2019-04-14
  Administered 2019-04-12 – 2019-04-14 (×6): via SUBCUTANEOUS

## 2019-04-11 MED FILL — HEPARIN (PORCINE) 5,000 UNIT/ML IJ SOLN: 5000 unit/mL | INTRAMUSCULAR | Qty: 1

## 2019-04-11 MED FILL — ATORVASTATIN 40 MG TAB: 40 mg | ORAL | Qty: 1

## 2019-04-11 MED FILL — MORPHINE 2 MG/ML INJECTION: 2 mg/mL | INTRAMUSCULAR | Qty: 1

## 2019-04-11 MED FILL — LORAZEPAM 2 MG/ML IJ SOLN: 2 mg/mL | INTRAMUSCULAR | Qty: 1

## 2019-04-11 MED FILL — THIAMINE HCL 100 MG TAB: 100 mg | ORAL | Qty: 1

## 2019-04-11 MED FILL — FAMOTIDINE 20 MG TAB: 20 mg | ORAL | Qty: 1

## 2019-04-11 MED FILL — THERAPEUTIC MULTIVITAMIN TAB: ORAL | Qty: 1

## 2019-04-11 MED FILL — NORMAL SALINE FLUSH 0.9 % INJECTION SYRINGE: INTRAMUSCULAR | Qty: 10

## 2019-04-11 MED FILL — CHILDREN'S ASPIRIN 81 MG CHEWABLE TABLET: 81 mg | ORAL | Qty: 1

## 2019-04-11 MED FILL — INSULIN LISPRO 100 UNIT/ML INJECTION: 100 unit/mL | SUBCUTANEOUS | Qty: 1

## 2019-04-11 MED FILL — OXYCODONE-ACETAMINOPHEN 5 MG-325 MG TAB: 5-325 mg | ORAL | Qty: 1

## 2019-04-11 MED FILL — INSULIN NPH HUMAN RECOMB 100 UNIT/ML INJECTION: 100 unit/mL | SUBCUTANEOUS | Qty: 1

## 2019-04-11 MED FILL — LISINOPRIL 20 MG TAB: 20 mg | ORAL | Qty: 1

## 2019-04-11 MED FILL — NICOTINE 14 MG/24 HR DAILY PATCH: 14 mg/24 hr | TRANSDERMAL | Qty: 1

## 2019-04-11 MED FILL — FOLIC ACID 1 MG TAB: 1 mg | ORAL | Qty: 1

## 2019-04-11 NOTE — Progress Notes (Signed)
Patient got up unassisted to go to the bed alarm and turned off the alarm before it went off. RN educated patient to not do this for safety reasons.

## 2019-04-11 NOTE — Progress Notes (Signed)
Patient had a run of v-tach and RN notified MD of this and that his vital signs were stable. MD said to continue to monitor the patient.

## 2019-04-11 NOTE — Other (Signed)
Bedside and Verbal shift change report given to Morgan (oncoming nurse) by Courtney (offgoing nurse). Report included the following information SBAR, Kardex, Intake/Output, MAR and Recent Results.
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
## 2019-04-11 NOTE — Progress Notes (Signed)
The patient left the room at this time to go sit in the waiting room of [facility] to be by himself for a minute. The patient did not return to the room when the tech asked him to twice. When the RN went to go check on him he was very tearful and very upset about his situation and stated that, "he didn't want or deserve it". RN encourage him and helped him back to his room where he allowed us to place another IV.

## 2019-04-11 NOTE — Progress Notes (Signed)
Bedside shift change report given to Courtney, RN (oncoming nurse) by Julia, RN (offgoing nurse). Report included the following information SBAR, Kardex, Intake/Output, MAR and Recent Results.
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
## 2019-04-11 NOTE — Other (Signed)
[facility]  CLINICAL NURSE SPECIALIST CONSULT  PROGRAM FOR DIABETES HEALTH    FOLLOW-UP NOTE    Presentation   Derek Mclaughlin is a 68 y.o. male with a PMH of DM, [name], HTN, [name] s/p stent placement, COPD, Afib, amxiety, MI, schizophrenia and alcohol abuse who presented to the ED with a c/o chest pain for a week with N/V/SOB and cough.  He is visiting from??[place]??and??states he was supposed to living with friends when he got here but the house was vacant when he arrived.??Has lived in [place] until he ran out of money now homeless.??He has been out of medications??for about 1 week prior to coming to ED. Also verbalized he was experiencing auditory and visual hallucinations the past two weeks.  In the ED he was admitted for a cardiac work-up.    Subjective   ???I am in much better spirits today.???    Cardiac cath revealed normal LVEDP  Concern for BZD withdrawal   Glucose 178-241 in the past 24 hours on 10 units basal with minimal SSI  Case Management assisting with referrals to inpatient ETOH facilities after discharge    Objective   Physical exam  General Alert, oriented and in no acute distress/ill-appearing. Conversant and cooperative.  Vital Signs   Visit Vitals  BP 134/78 (BP 1 Location: Left arm, BP Patient Position: At rest)   Pulse 72   Temp 98.7 ??F (37.1 ??C)   Resp 18   Ht '5\' 10"'$  (1.778 m)   Wt 116.9 kg (257 lb 11.2 oz)   SpO2 95%   BMI 36.98 kg/m??     Skin  Warm and dry  Heart   Regular rate and rhythm. No murmurs, rubs or gallops  Lungs  Clear to auscultation without rales or rhonchi  Extremities No foot wounds    Laboratory  Lab Results   Component Value Date/Time    Hemoglobin A1c 9.1 (H) 04/07/2019 04:49 PM     Lab Results   Component Value Date/Time    LDL, calculated 123.2 (H) 04/08/2019 12:29 AM     Lab Results   Component Value Date/Time    Creatinine 1.28 04/10/2019 03:14 AM     Lab Results   Component Value Date/Time    Sodium 135 (L) 04/10/2019 03:14 AM    Potassium 4.0 04/10/2019 03:14 AM     Chloride 104 04/10/2019 03:14 AM    CO2 22 04/10/2019 03:14 AM    Anion gap 9 04/10/2019 03:14 AM    Glucose 195 (H) 04/10/2019 03:14 AM    BUN 21 (H) 04/10/2019 03:14 AM    Creatinine 1.28 04/10/2019 03:14 AM    BUN/Creatinine ratio 16 04/10/2019 03:14 AM    GFR est AA >60 04/10/2019 03:14 AM    GFR est non-AA 56 (L) 04/10/2019 03:14 AM    Calcium 8.7 04/10/2019 03:14 AM    Bilirubin, total 0.3 04/08/2019 12:29 AM    Alk. phosphatase 76 04/08/2019 12:29 AM    Protein, total 6.7 04/08/2019 12:29 AM    Albumin 2.8 (L) 04/08/2019 12:29 AM    Globulin 3.9 04/08/2019 12:29 AM    A-G Ratio 0.7 (L) 04/08/2019 12:29 AM    ALT (SGPT) 15 04/08/2019 12:29 AM     Lab Results   Component Value Date/Time    ALT (SGPT) 15 04/08/2019 12:29 AM       Blood glucose pattern      Assessment and Plan   Nursing Diagnosis Risk for unstable blood glucose pattern  Nursing Intervention Domain 5250 Decision-making Support   Nursing Interventions Examined current inpatient diabetes control   Explored factors facilitating and impeding inpatient management     Evaluation   [name] is a 68 y.o. male with a PMH of DM, [name], HTN, [name] s/p stent placement, COPD, Afib, amxiety, MI, schizophrenia and alcohol abuse who presented to the ED with a c/o chest pain for a week with N/V/SOB and cough.  He is visiting from??[place]??and??states he was supposed to living with friends when he got here but the house was vacant when he arrived.??Has lived in [place] until he ran out of money now homeless.??His medications and glucometer were lost during his transit to [place].  Unfortunately, due to financial limitations, his diet and food options will be limited on discharge.  At this time, his blood glucose is in goal of 100-180.     Recommendations   Recommend:  1. Adjust basal insulin to low dose weight based 0.2units/kg: 10 units BID    2. Continue correctional insulin at normal sensitivity     3.  If patient continues to have pre-prandial hyperglycemia (over 200), add low dose mealtime humalog 0.05 units/kg: 5 units Humalog with meals    On Discharge:  Given his A1C was 9.1% and cost and resources are a supply concern.    -It would be recommended to resume his PTA metformin 1000 mg daily.    -Would add NPH 5 units BID  -Discontinue Lispro with meals  -Will need to establish a PCP- per case management recommendations   -Will need a new glucometer kit:  Check glucose twice daily (at breakfast and dinner)      Billing Code(s)     Thank you for including [place] in their care.  I spent 15 minutes in direct patient care today for this patient.  Time includes chart review, face to face with patient and collaboration with interdisciplinary care team.      Mittie Bodo, CNS  Access via [place] (C) [phone]

## 2019-04-11 NOTE — Progress Notes (Signed)
Physical Therapy  Patient requesting PT return after lunch as he did not sleep well last night.  Will continue to follow.  Kidada Ging E Jann Ra, PT, DPT

## 2019-04-11 NOTE — Progress Notes (Signed)
Spiritual Care Assessment/Progress Note  [name]      NAME: Derek Mclaughlin      MRN: [id number]  AGE: 68 y.o. SEX: male  Religious Affiliation: [employer]   Language: English     04/11/2019     Total Time (in minutes): 5     Spiritual Assessment begun in [place] through conversation with:         [] Patient        []  Family    []  Friend(s)        Reason for Consult: Initial/Spiritual assessment, patient floor     Spiritual beliefs: (Please include comment if needed)     []  Identifies with a faith tradition:         []  Supported by a faith community:            []  Claims no spiritual orientation:           []  Seeking spiritual identity:                []  Adheres to an individual form of spirituality:           [x]  Not able to assess:                           Identified resources for coping:      []  Prayer                               []  Music                  []  Guided Imagery     []  Family/friends                 []  Pet visits     []  Devotional reading                         [x]  Unknown     []  Other:                                              Interventions offered during this visit: (See comments for more details)    Patient Interventions: Crisis           Plan of Care:     []  Support spiritual and/or cultural needs    []  Support AMD and/or advance care planning process      []  Support grieving process   []  Coordinate Rites and/or Rituals    []  Coordination with community clergy   []  No spiritual needs identified at this time   []  Detailed Plan of Care below (See Comments)  []  Make referral to Music Therapy  []  Make referral to Pet Therapy     []  Make referral to Addiction services  []  Make referral to [facility]  []  Make referral to Spiritual Care Partner  []  No future visits requested        [x]  Follow up visits as needed     Comments: Chaplain visit for initial spiritual assessment.  Patient appears to be resting.  Door closed for privacy, did not respond to knock  at door or verbal greeting.  Will continue to follow up as needed and  upon request as able.  ??  Visited by Rev. Deeann CreeJohn Ostoich, MDiv, ThM, [facility]  Chaplain paging service: [phone]  ??  ??

## 2019-04-11 NOTE — Progress Notes (Signed)
Problem: Mobility Impaired (Adult and Pediatric)  Goal: *Acute Goals and Plan of Care (Insert Text)  Description: FUNCTIONAL STATUS PRIOR TO ADMISSION: Patient reports that he was modified independent using a rollator for functional mobility. He reports that his rollator was on the bus that he was on as he traveled here to [place] from [place] and the rollator did not make it off the bus. He report that he has called the bus company in an effort to retrieve his rollator. He reports that he bought that rollator outright, did not go through his insurance. When asked, he reports 6 falls in the last 12 months. [facility] from [facility] bathroom.    HOME SUPPORT PRIOR TO ADMISSION: The patient reports that he has a home in [place] and has family living in that home and that he does not know where he is going to live but that he will not be living there, please refer to care management notes.    Physical Therapy Goals  Initiated 04/10/2019  1.  Patient will move from supine to sit and sit to supine , scoot up and down and roll side to side in bed with independence within 7 day(s).    2.  Patient will transfer from bed to chair and chair to bed with modified independence using the least restrictive device within 7 day(s).  3.  Patient will perform sit to stand with modified independence within 7 day(s).  4.  Patient will ambulate with modified independence for 50 feet with the least restrictive device within 7 day(s).   Stairs goal as needed   Outcome: Progressing Towards Goal   PHYSICAL THERAPY TREATMENT  Patient: Derek Mclaughlin ([age] y.o. male)  Date: 04/11/2019  Diagnosis: Chest pain [R07.9]  Chest pain [R07.9]   <principal problem not specified>  Procedure(s) (LRB):  LEFT HEART CATH / CORONARY ANGIOGRAPHY (N/A)  Fractional Flow Reserve (N/A) 3 Days Post-Op  Precautions: Contact, Bed Alarm, Fall(enteric)  Chart, physical therapy assessment, plan of care and goals were reviewed.     ASSESSMENT  Patient continues with skilled PT services and is progressing towards goals. Patient currently limited by decreased activity tolerance and fair upright balance.  Otherwise moving well.  Needing SBA for transfers and CGA for amb with RW.  Patient with 1 instance of L knee partial buckling but able to self recover.  Reviewed HEP and safety in the room.  Will continue to follow.     Other factors to consider for discharge: at risk for falls, impulsive at times, homeless per patient report         PLAN :  Patient continues to benefit from skilled intervention to address the above impairments.  Continue treatment per established plan of care.  to address goals.    Recommendation for discharge: (in order for the patient to meet his/her long term goals)  To be determined:         IF patient discharges home will need the following DME: rolling walker       SUBJECTIVE:   Patient stated ???I need to figure out how I can get a rollator since the bus company lost mine.???    OBJECTIVE DATA SUMMARY:   Critical Behavior:  Neurologic State: Alert  Orientation Level: Oriented X4  Cognition: Follows commands, Impulsive  Safety/Judgement: Decreased awareness of need for assistance, Decreased awareness of need for safety  Functional Mobility Training:  Bed Mobility:  Rolling: Supervision  Supine  to Sit: Supervision  Sit to Supine: Supervision  Scooting: Supervision        Transfers:  Sit to Stand: Stand-by assistance  Stand to Sit: Stand-by assistance                             Balance:  Sitting: Intact  Standing: Intact;With support  Standing - Static: Good  Ambulation/Gait Training:  Distance (ft): 250 Feet (ft)  Assistive Device: Gait belt;Walker, rolling  Ambulation - Level of Assistance: Contact guard assistance        Gait Abnormalities: Decreased step clearance;Shuffling gait        Base of Support: Widened     Speed/Cadence: Pace decreased (<100 feet/min);Slow  Step Length: Left shortened;Right shortened        Pain Rating:  No c/o pain    Activity Tolerance:   Fair and requires frequent rest breaks  Please refer to the flowsheet for vital signs taken during this treatment.    After treatment patient left in no apparent distress:   Supine in bed and Call bell within reach, bed alarm in place    COMMUNICATION/COLLABORATION:   The patient???s plan of care was discussed with: Physical therapist, Occupational therapist, and Registered nurse.     Shirley Friar, PT, DPT   Time Calculation: 24 mins

## 2019-04-11 NOTE — Progress Notes (Addendum)
TOC:    RUR: 14%    CM continuing to follow for discharge planning.     CM was informed of three inpatient ETOH facilities that patient may be eligible for. Cm called: [facility], they only accept straight Medicare. [facility] and [facility] said they did not think they accepted his insurance either (patient has AARP Medicare complete) but suggested this CM fax over the face sheet. CM has done so and am awaiting a return call.    If neither accept, cm will call [facility] to see if they are open and accepting clients at this time.    1:20pm: [facility] is not accepting patients at this time due to covid.    Cm called [facility] ([phone]), Susan Roberts, but she has left for the day. [facility] ([phone]) has also closed for the day. **these can be called on Monday for follow up**.    Cm called [facility] ([phone]) they do not accept his insurance but their sister facility in [place] does. However, that facility wil only take patients actively detoxing.     Other facilities that can be called on another day: [facility]: [phone], [facility] ([phone]), [facility] ([phone]). [facility]: [phone].     S. Caitlin Helton BSW, ACM

## 2019-04-11 NOTE — Progress Notes (Signed)
Occupational Therapy    Chart reviewed in prep for skilled OT; however, pt politely declined stating he wasn't able to sleep last night, requesting treatment after lunch.  OT will defer at this time and follow up later as able and appropriate.    Thank you,  Matthew Kerley, OT

## 2019-04-11 NOTE — Progress Notes (Signed)
Patient got up unassisted to go to the bed alarm and turned off the alarm before it went off. RN educated patient to not do this for safety reasons.

## 2019-04-11 NOTE — Progress Notes (Signed)
TOC:    RUR: 14%    CM continuing to follow for discharge planning.     CM was informed of three inpatient ETOH facilities that patient may be eligible for. Cm called: [facility], they only accept straight Medicare. [facility] and [facility] said they did not think they accepted his insurance either (patient has AARP Medicare complete) but suggested this CM fax over the face sheet. CM has done so and am awaiting a return call.    If neither accept, cm will call [facility] to see if they are open and accepting clients at this time.    1:20pm: [facility] is not accepting patients at this time due to covid.    Cm called [facility] [phone]), Concha Pyo, but she has left for the day. [facility] [phone]) has also closed for the day. **these can be called on Monday for follow up**.    Cm called [facility] ([phone]) they do not accept his insurance but their sister facility in [place], [place]. does. However, that facility wil only take patients actively detoxing.     Other facilities that can be called on another day: [facility]: [phone], [facility] [phone]), [facility] [phone]). [facility]: [phone].     S. Caitlin Helton BSW, ACM

## 2019-04-11 NOTE — Progress Notes (Signed)
Progress Notes by [name], MD at 04/11/19 1135                Author: [name], MD  Service: Internal Medicine  Author Type: Physician       Filed: 04/11/19 1138  Date of Service: 04/11/19 1135  Status: Signed          Editor: [name], MD (Physician)                    [facility]                                                                                              Hospitalist Progress Note   [name], MD   Answering service: [phone] OR [phone] from in house phone            Date of Service:  04/11/2019   NAME:  Derek Mclaughlin   DOB:  September 26, 1951   MRN:  [id number]           Admission Summary:        Derek Mclaughlin??is a 69 y.o.??male??who presents with PMH of DM, [name], HTN, [name], [name] ??He is visiting from??[place]??and??states he was supposed to living with friends when  he got here but the house was vacant when he arrived.??Has lived in [place] until he ran out of money now homeless.??He has been out of medications??for about 1 week prior to coming to ED. He??states he has a history of alcohol dependence and??restarted  drinking alcohol about 2 weeks??prior to coming to ED??and drinks 12 beers/day. He??states he has a hx of bipolar and schizophrenia and has had an increase in symptoms of depression over last few months, he stated??he doesn't care if he lives  but denies suicidal and homicidal ideations.He??states he is??having??auditory and visual??hallucinations??x2 weeks where he hears his deceased wifes voice and thinks he has seen her in a room with him.??   Work up in ED: negative CXR, EKG, cardiology consult, ECHO ordered, labs reviewed, psych consult, r/o COVID. 04/07/2019   ??      Interval history / Subjective:        Pt is  emotionally labile, he had diarrhea    His enteric bacterial panel is negative no new issues awaiting for placement           Assessment & Plan:          Atypical Chest Pain:??resolved    - likely due to anxiety   -WUX:LKGMWN sinus rhythm,??Inferior infarct, age undetermined,??RBBB   -troponin negative x 3 negative   -ECHO: EF 55 - 60%. No regional wall motion abnormality noted. Mild (grade 1) left ventricular diastolic dysfunction.   -cardiology on board   - cardiac cath 6/9: Normal LVEDP.    ??   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 Takes Xanax 2mg  tid last  filled in May 20th 90 pills but patientstates he lost his medication while travelling here. Last dose was on Thursday 04/03/2019   - IV ativan prn    ??   Severe anxiety/ Panic attacks    C/w Hallucinations: hx of Bipolar and schizophrenia.??   - Auditory and visual hallucinations of deceased wife.??   -denies HI and SI, not on any mood medications except for Xanax   -seen by psyche , recommendation noted but pt does not want those medications as he thinks that makes him weak    - hydroxyzine prn    ??   DM type II with hyperglycemia   - SSI    - HGA1c 9   - started on NPH 10 U bid. C/w ISS   ??   [name]:    -lipid panel LDL 123   - started on statin   ????   HTN: BP elevated   - not on any meds per pharmacy   - started on lisinopril 20mg  daily     -prn hydralazine   ??   COPD not in exacerbation   -CXR negative   -guaifenesin as needed for cough, albuterol inhaler as needed   - COVID 19 negative ??   - recommend pulmonology and PFT eval as outpt    ??   ETOH Dependence:    - drinks 12 beers/day.   -CIWA protocol   ??   Tobacco Dependence: current smoker 2ppd-1/2ppd.   -not interested in quitting, counseled for cessation   -nicotine patch   ??   Fall 6/10   - CT head: No significant intracranial abnormalities   - CT neck: Avulsion fracture of the tip of the T1 spinous process, probably chronic   - NSGY consult placed    ??   Homelessness   -case management  consult for resources      Code status: Full   DVT prophylaxis: SCD      Care Plan discussed with: Patient/Family and Nurse   Anticipated Disposition: Home w/Family   Anticipated Discharge: 24 hours to 48 hours           Hospital Problems   Date Reviewed:  04/07/2019                         Codes  Class  Noted  POA              Schizophrenia (HCC)  ICD-10-CM: F20.9   ICD-9-CM: 295.90    04/09/2019  Yes                        Bipolar 1 disorder, depressed, severe (HCC)  ICD-10-CM: F31.4   ICD-9-CM: 296.53    04/09/2019  Yes                        Tobacco abuse  ICD-10-CM: Z72.0   ICD-9-CM: 305.1    04/09/2019  Yes                        Alcohol abuse  ICD-10-CM: F10.10   ICD-9-CM: 305.00    04/09/2019  Yes                        Chest pain  ICD-10-CM: R07.9   ICD-9-CM: 786.50    04/07/2019  Yes  Review of Systems:     A comprehensive review of systems was negative.            Vital Signs:      Last 24hrs VS reviewed since prior progress note. Most recent are:   Visit Vitals      BP  134/78 (BP 1 Location: Left arm, BP Patient Position: At rest)     Pulse  72     Temp  98.7 ??F (37.1 ??C)     Resp  18     Ht  5\' 10"  (1.778 m)     Wt  116.9 kg (257 lb 11.2 oz)     SpO2  95%        BMI  36.98 kg/m??              Intake/Output Summary (Last 24 hours) at 04/11/2019 1135   Last data filed at 04/11/2019 0350     Gross per 24 hour        Intake  --        Output  325 ml        Net  -325 ml              Physical Examination:                     Constitutional:   No acute distress, cooperative, pleasant      ENT:   Oral mucosa moist, oropharynx benign.      Resp:   CTA bilaterally. No wheezing/rhonchi/rales. No accessory muscle use     CV:   Regular rhythm, normal rate, no murmurs, gallops, rubs         GI:   Soft, non distended, non tender. normoactive bowel sounds, no hepatosplenomegaly          Musculoskeletal:   No edema, warm, 2+ pulses throughout         Neurologic:   Moves all extremities.  AAOx3, CN  II-XII reviewed       Psych:  Good insight, positive anxious nor agitated.               Data Review:      I personally reviewed  Image and Lbs           Labs:          Recent Labs           04/09/19   0111     WBC  10.8     HGB  14.1     HCT  43.2        PLT  244          Recent Labs            04/10/19   0314  04/09/19   0111     NA  135*  138     K  4.0  3.4*     CL  104  105     CO2  22  23     BUN  21*  16     CREA  1.28  0.98     GLU  195*  140*         CA  8.7  8.2*        No results for input(s): ALT, AP, TBIL, TBILI, TP, ALB, GLOB, GGT, AML, LPSE in the last 72 hours.      No lab exists for component: SGOT, GPT, AMYP, HLPSE  Recent Labs             04/11/19   0405  04/10/19   0314  04/09/19   0111     INR  1.0  1.0  1.0     PTP  10.4  9.9  10.3          APTT  27.3  27.1  27.1         No results for input(s): FE, TIBC, PSAT, FERR in the last 72 hours.    No results found for: FOL, RBCF    No results for input(s): PH, PCO2, PO2 in the last 72 hours.   No results for input(s): CPK, CKNDX, TROIQ in the last 72 hours.      No lab exists for component: CPKMB     Lab Results         Component  Value  Date/Time            Cholesterol, total  201 (H)  04/08/2019 12:29 AM       HDL Cholesterol  36  04/08/2019 12:29 AM       LDL, calculated  123.2 (H)  04/08/2019 12:29 AM       Triglyceride  209 (H)  04/08/2019 12:29 AM            CHOL/HDL Ratio  5.6 (H)  04/08/2019 12:29 AM          Lab Results         Component  Value  Date/Time            Glucose (POC)  233 (H)  04/11/2019 11:07 AM       Glucose (POC)  241 (H)  04/11/2019 06:36 AM       Glucose (POC)  178 (H)  04/10/2019 09:14 PM       Glucose (POC)  195 (H)  04/10/2019 04:01 PM            Glucose (POC)  162 (H)  04/10/2019 11:34 AM          Lab Results         Component  Value  Date/Time            Color  YELLOW/STRAW  04/08/2019 02:07 AM       Appearance  CLEAR  04/08/2019 02:07 AM       Specific gravity  1.019  04/08/2019 02:07 AM       pH (UA)  5.0   04/08/2019 02:07 AM       Protein  100 (A)  04/08/2019 02:07 AM       Glucose  Negative  04/08/2019 02:07 AM       Ketone  Negative  04/08/2019 02:07 AM       Bilirubin  Negative  04/08/2019 02:07 AM       Urobilinogen  0.2  04/08/2019 02:07 AM       Nitrites  Negative  04/08/2019 02:07 AM       Leukocyte Esterase  Negative  04/08/2019 02:07 AM       Epithelial cells  FEW  04/08/2019 02:07 AM       Bacteria  Negative  04/08/2019 02:07 AM       WBC  0-4  04/08/2019 02:07 AM            RBC  50-100  04/08/2019 02:07 AM                Medications Reviewed:  Current Facility-Administered Medications          Medication  Dose  Route  Frequency           ?  oxyCODONE-acetaminophen (PERCOCET) 5-325 mg per tablet 1 Tab   1 Tab  Oral  Q8H PRN           ?  thiamine HCL (B-1) tablet 100 mg   100 mg  Oral  DAILY     ?  folic acid (FOLVITE) tablet 1 mg   1 mg  Oral  DAILY     ?  therapeutic multivitamin (THERAGRAN) tablet 1 Tab   1 Tab  Oral  DAILY     ?  LORazepam (ATIVAN) injection 1 mg   1 mg  IntraVENous  Q4H PRN     ?  insulin NPH (NOVOLIN N, HUMULIN N) injection 5 Units   5 Units  SubCUTAneous  ACB/HS     ?  lisinopriL (PRINIVIL, ZESTRIL) tablet 20 mg   20 mg  Oral  DAILY     ?  sodium chloride (NS) flush 5-40 mL   5-40 mL  IntraVENous  Q8H     ?  sodium chloride (NS) flush 5-40 mL   5-40 mL  IntraVENous  PRN     ?  famotidine (PEPCID) tablet 20 mg   20 mg  Oral  BID     ?  morphine injection 0.5 mg   0.5 mg  IntraVENous  Q6H PRN     ?  hydrOXYzine HCL (ATARAX) tablet 25 mg   25 mg  Oral  TID PRN     ?  atorvastatin (LIPITOR) tablet 40 mg   40 mg  Oral  QHS     ?  sodium chloride (NS) flush 5-40 mL   5-40 mL  IntraVENous  Q8H     ?  sodium chloride (NS) flush 5-40 mL   5-40 mL  IntraVENous  PRN     ?  heparin (porcine) injection 5,000 Units   5,000 Units  SubCUTAneous  Q8H     ?  prochlorperazine (COMPAZINE) tablet 10 mg   10 mg  Oral  Q6H PRN     ?  glucose chewable tablet 16 g   4 Tab  Oral  PRN     ?  glucagon  (GLUCAGEN) injection 1 mg   1 mg  IntraMUSCular  PRN     ?  insulin lispro (HUMALOG) injection     SubCUTAneous  AC&HS     ?  acetaminophen (TYLENOL) tablet 650 mg   650 mg  Oral  Q4H PRN     ?  hydrALAZINE (APRESOLINE) 20 mg/mL injection 20 mg   20 mg  IntraVENous  Q6H PRN     ?  guaiFENesin (ROBITUSSIN) 100 mg/5 mL oral liquid 100 mg   100 mg  Oral  Q4H PRN     ?  ondansetron (ZOFRAN) injection 4 mg   4 mg  IntraVENous  Q4H PRN     ?  nicotine (NICODERM CQ) 14 mg/24 hr patch 1 Patch   1 Patch  TransDERmal  DAILY     ?  albuterol (PROVENTIL HFA, VENTOLIN HFA, PROAIR HFA) inhaler 2 Puff   2 Puff  Inhalation  Q4H PRN           ?  aspirin chewable tablet 81 mg   81 mg  Oral  DAILY        ______________________________________________________________________   EXPECTED  LENGTH OF STAY: 2d 14h   ACTUAL LENGTH OF STAY:          3                    Lucky Rathke, MD

## 2019-04-11 NOTE — Progress Notes (Signed)
Spiritual Care Assessment/Progress Note  [name]      NAME: Derek Mclaughlin      MRN: [id number]  AGE: 68 y.o. SEX: male  Religious Affiliation: [employer]   Language: English     04/11/2019     Total Time (in minutes): 5     Spiritual Assessment begun in [facility] 5E2 SURGICAL UNIT through conversation with:         [] Patient        []  Family    []  Friend(s)        Reason for Consult: Initial/Spiritual assessment, patient floor     Spiritual beliefs: (Please include comment if needed)     []  Identifies with a faith tradition:         []  Supported by a faith community:            []  Claims no spiritual orientation:           []  Seeking spiritual identity:                []  Adheres to an individual form of spirituality:           [x]  Not able to assess:                           Identified resources for coping:      []  Prayer                               []  Music                  []  Guided Imagery     []  Family/friends                 []  Pet visits     []  Devotional reading                         [x]  Unknown     []  Other:                                              Interventions offered during this visit: (See comments for more details)    Patient Interventions: Crisis           Plan of Care:     []  Support spiritual and/or cultural needs    []  Support AMD and/or advance care planning process      []  Support grieving process   []  Coordinate Rites and/or Rituals    []  Coordination with community clergy   []  No spiritual needs identified at this time   []  Detailed Plan of Care below (See Comments)  []  Make referral to Music Therapy  []  Make referral to Pet Therapy     []  Make referral to Addiction services  []  Make referral to [facility] Passages  []  Make referral to Spiritual Care Partner  []  No future visits requested        [x]  Follow up visits as needed     Comments: Chaplain visit for initial spiritual assessment.  Patient appears to be resting.  Door closed for privacy, did not respond to knock at door or  verbal greeting.  Will continue to follow up as needed and  upon request as able.    Visited by Rev. Deeann Cree, MDiv, ThM, [facility]  Chaplain paging service: [phone])

## 2019-04-11 NOTE — Progress Notes (Signed)
Occupational Therapy    Chart reviewed in prep for skilled OT; however, pt politely declined stating he wasn't able to sleep last night, requesting treatment after lunch.  OT will defer at this time and follow up later as able and appropriate.    Thank you,  Glenis Smoker, OT

## 2019-04-11 NOTE — Progress Notes (Signed)
Patient had a run of v-tach and RN notified MD of this and that his vital signs were stable. MD said to continue to monitor the patient.
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
## 2019-04-11 NOTE — Progress Notes (Signed)
Physical Therapy  Patient requesting PT return after lunch as he did not sleep well last night.  Will continue to follow.  Leverne Humbles, PT, DPT

## 2019-04-11 NOTE — Group Note (Signed)
Diabetes  Mgmt by Mittie Bodo, CNS at 04/11/19 1138                Author: Mittie Bodo, CNS  Service: Certified Clinical Nurse Specialist  Author Type: Clinical Nurse Specialist       Filed: 04/11/19 1149  Date of Service: 04/11/19 1138  Status: Signed          Editor: Mittie Bodo, CNS (Clinical Nurse Specialist)               [place]      FOLLOW-UP NOTE        Presentation     Derek Mclaughlin is a 68 y.o. male with a PMH of DM, [name], HTN, [name] s/p stent placement, COPD, Afib, amxiety, MI, schizophrenia and alcohol abuse who presented  to the ED with a c/o chest pain for a week with N/V/SOB and cough.  He is visiting from??[place]??and??states he was supposed to living with friends when he got here but the house was vacant when he arrived.??Has lived in [place] until he ran  out of money now homeless.??He has been out of medications??for about 1 week prior to coming to ED. Also verbalized he was experiencing auditory and visual hallucinations the past two weeks.  In the ED he was admitted for a cardiac work-up.        Subjective     I am in much better spirits today.      Cardiac cath revealed normal LVEDP   Concern for BZD withdrawal    Glucose 178-241 in the past 24 hours on 10 units basal with minimal SSI   Case Management assisting with referrals to inpatient ETOH facilities after discharge        Objective     Physical exam   General Alert, oriented and in no acute distress/ill-appearing. Conversant and cooperative.   Vital Signs    Visit Vitals      BP  134/78 (BP 1 Location: Left arm, BP Patient Position: At rest)     Pulse  72     Temp  98.7 ??F (37.1 ??C)     Resp  18     Ht  5' 10"  (1.778 m)     Wt  116.9 kg (257 lb 11.2 oz)     SpO2  95%        BMI  36.98 kg/m??        Skin  Warm and dry   Heart   Regular rate and rhythm. No murmurs, rubs or gallops   Lungs  Clear to auscultation without rales or rhonchi   Extremities No foot  wounds      Laboratory     Lab Results         Component  Value  Date/Time            Hemoglobin A1c  9.1 (H)  04/07/2019 04:49 PM          Lab Results         Component  Value  Date/Time            LDL, calculated  123.2 (H)  04/08/2019 12:29 AM          Lab Results         Component  Value  Date/Time            Creatinine  1.28  04/10/2019 03:14 AM  Lab Results         Component  Value  Date/Time            Sodium  135 (L)  04/10/2019 03:14 AM       Potassium  4.0  04/10/2019 03:14 AM       Chloride  104  04/10/2019 03:14 AM       CO2  22  04/10/2019 03:14 AM       Anion gap  9  04/10/2019 03:14 AM       Glucose  195 (H)  04/10/2019 03:14 AM       BUN  21 (H)  04/10/2019 03:14 AM       Creatinine  1.28  04/10/2019 03:14 AM       BUN/Creatinine ratio  16  04/10/2019 03:14 AM       GFR est AA  >60  04/10/2019 03:14 AM       GFR est non-AA  56 (L)  04/10/2019 03:14 AM       Calcium  8.7  04/10/2019 03:14 AM       Bilirubin, total  0.3  04/08/2019 12:29 AM       Alk. phosphatase  76  04/08/2019 12:29 AM       Protein, total  6.7  04/08/2019 12:29 AM       Albumin  2.8 (L)  04/08/2019 12:29 AM       Globulin  3.9  04/08/2019 12:29 AM       A-G Ratio  0.7 (L)  04/08/2019 12:29 AM            ALT (SGPT)  15  04/08/2019 12:29 AM          Lab Results         Component  Value  Date/Time            ALT (SGPT)  15  04/08/2019 12:29 AM           Blood glucose pattern             Assessment and Plan        Nursing Diagnosis  Risk for unstable blood glucose pattern     Nursing Intervention Domain  5250 Decision-making Support        Nursing Interventions  Examined current inpatient diabetes control    Explored factors facilitating and impeding inpatient management          Evaluation     Derek Mclaughlin is a 68 y.o. male with a PMH of DM, [name], HTN, [name] s/p stent placement, COPD, Afib, amxiety, MI, schizophrenia and alcohol abuse who presented to the ED with a c/o  chest pain for a week with N/V/SOB and cough.  He is  visiting from??[place]??and??states he was supposed to living with friends when he got here but the house was vacant when he arrived.??Has lived in [place] until he ran out of money now homeless.??His  medications and glucometer were lost during his transit to [place].  Unfortunately, due to financial limitations, his diet and food options will be limited on discharge.  At this time, his blood glucose is in goal of 100-180.         Recommendations     Recommend:   1. Adjust basal insulin to low dose weight based 0.2units/kg: 10 units BID      2. Continue correctional insulin at normal sensitivity      3.  If patient continues to have pre-prandial hyperglycemia (  over 200), add low dose mealtime humalog 0.05 units/kg: 5 units Humalog with meals      On Discharge:   Given his A1C was 9.1% and cost and resources are a supply concern.     -It would be recommended to resume his PTA metformin 1000 mg daily.     -Would add NPH 5 units BID   -Discontinue Lispro with meals   -Will need to establish a PCP- per case management recommendations    -Will need a new glucometer kit:  Check glucose twice daily (at breakfast and dinner)           Billing Code(s)        Thank you for including [place] in their care.  I spent 15 minutes in direct patient care today for this patient.  Time includes chart review, face to face with patient and collaboration with interdisciplinary care team.         Mittie Bodo, CNS   Access via [place] (C) [phone]

## 2019-04-11 NOTE — Progress Notes (Signed)
Problem: Mobility Impaired (Adult and Pediatric)  Goal: *Acute Goals and Plan of Care (Insert Text)  Description: FUNCTIONAL STATUS PRIOR TO ADMISSION: Patient reports that he was modified independent using a rollator for functional mobility. He reports that his rollator was on the bus that he was on as he traveled here to [place] from [place] did not make it off the bus. He report that he has called the bus company in an effort to retrieve his rollator. He reports that he bought that rollator outright, did not go through his insurance. When asked, he reports 6 falls in the last 12 months. He had a fall here at [facility] this admission amb back from the bathroom.    HOME SUPPORT PRIOR TO ADMISSION: The patient reports that he has a home in [place] and has family living in that home and that he does not know where he is going to live but that he will not be living there, please refer to care management notes.    Physical Therapy Goals  Initiated 04/10/2019  1.  Patient will move from supine to sit and sit to supine , scoot up and down and roll side to side in bed with independence within 7 day(s).    2.  Patient will transfer from bed to chair and chair to bed with modified independence using the least restrictive device within 7 day(s).  3.  Patient will perform sit to stand with modified independence within 7 day(s).  4.  Patient will ambulate with modified independence for 50 feet with the least restrictive device within 7 day(s).   Stairs goal as needed   Outcome: Progressing Towards Goal   PHYSICAL THERAPY TREATMENT  Patient: Derek Mclaughlin (68 y.o. male)  Date: 04/11/2019  Diagnosis: Chest pain [R07.9]  Chest pain [R07.9]   <principal problem not specified>  Procedure(s) (LRB):  LEFT HEART CATH / CORONARY ANGIOGRAPHY (N/A)  Fractional Flow Reserve (N/A) 3 Days Post-Op  Precautions: Contact, Bed Alarm, Fall(enteric)  Chart, physical therapy assessment, plan of care and goals were  reviewed.    ASSESSMENT  Patient continues with skilled PT services and is progressing towards goals. Patient currently limited by decreased activity tolerance and fair upright balance.  Otherwise moving well.  Needing SBA for transfers and CGA for amb with RW.  Patient with 1 instance of L knee partial buckling but able to self recover.  Reviewed HEP and safety in the room.  Will continue to follow.     Other factors to consider for discharge: at risk for falls, impulsive at times, homeless per patient report         PLAN :  Patient continues to benefit from skilled intervention to address the above impairments.  Continue treatment per established plan of care.  to address goals.    Recommendation for discharge: (in order for the patient to meet his/her long term goals)  To be determined:         IF patient discharges home will need the following DME: rolling walker       SUBJECTIVE:   Patient stated ???I need to figure out how I can get a rollator since the bus company lost mine.???    OBJECTIVE DATA SUMMARY:   Critical Behavior:  Neurologic State: Alert  Orientation Level: Oriented X4  Cognition: Follows commands, Impulsive  Safety/Judgement: Decreased awareness of need for assistance, Decreased awareness of need for safety  Functional Mobility Training:  Bed Mobility:  Rolling: Supervision  Supine  to Sit: Supervision  Sit to Supine: Supervision  Scooting: Supervision        Transfers:  Sit to Stand: Stand-by assistance  Stand to Sit: Stand-by assistance                             Balance:  Sitting: Intact  Standing: Intact;With support  Standing - Static: Good  Ambulation/Gait Training:  Distance (ft): 250 Feet (ft)  Assistive Device: Gait belt;Walker, rolling  Ambulation - Level of Assistance: Contact guard assistance        Gait Abnormalities: Decreased step clearance;Shuffling gait        Base of Support: Widened     Speed/Cadence: Pace decreased (<100 feet/min);Slow  Step Length: Left shortened;Right  shortened       Pain Rating:  No c/o pain    Activity Tolerance:   Fair and requires frequent rest breaks  Please refer to the flowsheet for vital signs taken during this treatment.    After treatment patient left in no apparent distress:   Supine in bed and Call bell within reach, bed alarm in place    COMMUNICATION/COLLABORATION:   The patient???s plan of care was discussed with: Physical therapist, Occupational therapist, and Registered nurse.     Leverne HumblesSarah E Gared Gillie, PT, DPT   Time Calculation: 24 mins

## 2019-04-11 NOTE — Progress Notes (Signed)
 The patient left the room at this time to go sit in the waiting room of [address] [facility] to be by himself for a minute. The patient did not return to the room when the tech asked him to twice. When the RN went to go check on him he was very tearful and very upset about his situation and stated that, he didn't want or deserve it. RN encourage him and helped him back to his room where he allowed us  to place another IV.

## 2019-04-12 LAB — CBC WITH AUTO DIFFERENTIAL
Basophils %: 0 % (ref 0–1)
Basophils Absolute: 0 10*3/uL (ref 0.0–0.1)
Eosinophils %: 4 % (ref 0–7)
Eosinophils Absolute: 0.4 10*3/uL (ref 0.0–0.4)
Granulocyte Absolute Count: 0.1 10*3/uL — ABNORMAL HIGH (ref 0.00–0.04)
Hematocrit: 43.4 % (ref 36.6–50.3)
Hemoglobin: 14.1 g/dL (ref 12.1–17.0)
Immature Granulocytes: 1 % — ABNORMAL HIGH (ref 0.0–0.5)
Lymphocytes %: 24 % (ref 12–49)
Lymphocytes Absolute: 2.6 10*3/uL (ref 0.8–3.5)
MCH: 28.9 PG (ref 26.0–34.0)
MCHC: 32.5 g/dL (ref 30.0–36.5)
MCV: 88.9 FL (ref 80.0–99.0)
MPV: 11.5 FL (ref 8.9–12.9)
Monocytes %: 9 % (ref 5–13)
Monocytes Absolute: 1 10*3/uL (ref 0.0–1.0)
NRBC Absolute: 0 10*3/uL (ref 0.00–0.01)
Neutrophils %: 62 % (ref 32–75)
Neutrophils Absolute: 6.6 10*3/uL (ref 1.8–8.0)
Nucleated RBCs: 0 PER 100 WBC
Platelets: 238 10*3/uL (ref 150–400)
RBC: 4.88 M/uL (ref 4.10–5.70)
RDW: 13.6 % (ref 11.5–14.5)
WBC: 10.7 10*3/uL (ref 4.1–11.1)

## 2019-04-12 LAB — COMPREHENSIVE METABOLIC PANEL
ALT: 32 U/L (ref 12–78)
AST: 21 U/L (ref 15–37)
Albumin/Globulin Ratio: 0.7 — ABNORMAL LOW (ref 1.1–2.2)
Albumin: 2.8 g/dL — ABNORMAL LOW (ref 3.5–5.0)
Alkaline Phosphatase: 74 U/L (ref 45–117)
Anion Gap: 6 mmol/L (ref 5–15)
BUN: 26 MG/DL — ABNORMAL HIGH (ref 6–20)
Bun/Cre Ratio: 22 — ABNORMAL HIGH (ref 12–20)
CO2: 24 mmol/L (ref 21–32)
Calcium: 8.8 MG/DL (ref 8.5–10.1)
Chloride: 105 mmol/L (ref 97–108)
Creatinine: 1.17 MG/DL (ref 0.70–1.30)
EGFR IF NonAfrican American: >60 mL/min/{1.73_m2} (ref >60)
GFR African American: >60 mL/min/{1.73_m2} (ref >60)
Globulin: 3.8 g/dL (ref 2.0–4.0)
Glucose: 196 mg/dL — ABNORMAL HIGH (ref 65–100)
Potassium: 4.1 mmol/L (ref 3.5–5.1)
Sodium: 135 mmol/L — ABNORMAL LOW (ref 136–145)
Total Bilirubin: 0.2 MG/DL (ref 0.2–1.0)
Total Protein: 6.6 g/dL (ref 6.4–8.2)

## 2019-04-12 LAB — POCT GLUCOSE
POC Glucose: 165 mg/dL — ABNORMAL HIGH (ref 65–100)
POC Glucose: 239 mg/dL — ABNORMAL HIGH (ref 65–100)
POC Glucose: 169 mg/dL — ABNORMAL HIGH (ref 65–100)
POC Glucose: 164 mg/dL — ABNORMAL HIGH (ref 65–100)

## 2019-04-12 LAB — APTT: aPTT: 26.2 s (ref 22.1–32.0)

## 2019-04-12 LAB — PROTIME-INR
INR: 1 (ref 0.9–1.1)
Protime: 10.1 s (ref 9.0–11.1)

## 2019-04-12 LAB — FIBRINOGEN
Fibrinogen: 494 mg/dL — ABNORMAL HIGH (ref 200–475)
Fibrinogen: 494 mg/dL — ABNORMAL HIGH (ref 200–475)

## 2019-04-12 LAB — D-DIMER, QUANTITATIVE: D-Dimer, Quant: 0.43 mg/L FEU (ref 0.00–0.65)

## 2019-04-12 LAB — PHOSPHORUS
Phosphorus: 3.6 MG/DL (ref 2.6–4.7)
Phosphorus: 3.6 MG/DL (ref 2.6–4.7)

## 2019-04-12 LAB — MAGNESIUM
Magnesium: 1.8 mg/dL (ref 1.6–2.4)
Magnesium: 1.8 mg/dL (ref 1.6–2.4)

## 2019-04-12 LAB — PROTHROMBIN TIME + INR
INR: 1 (ref 0.9–1.1)
Prothrombin time: 10.1 s (ref 9.0–11.1)

## 2019-04-12 LAB — METABOLIC PANEL, COMPREHENSIVE
A-G Ratio: 0.7 — ABNORMAL LOW (ref 1.1–2.2)
ALT (SGPT): 32 U/L (ref 12–78)
AST (SGOT): 21 U/L (ref 15–37)
Albumin: 2.8 g/dL — ABNORMAL LOW (ref 3.5–5.0)
Alk. phosphatase: 74 U/L (ref 45–117)
Anion gap: 6 mmol/L (ref 5–15)
BUN/Creatinine ratio: 22 — ABNORMAL HIGH (ref 12–20)
BUN: 26 MG/DL — ABNORMAL HIGH (ref 6–20)
Bilirubin, total: 0.2 MG/DL (ref 0.2–1.0)
CO2: 24 mmol/L (ref 21–32)
Calcium: 8.8 MG/DL (ref 8.5–10.1)
Chloride: 105 mmol/L (ref 97–108)
Creatinine: 1.17 MG/DL (ref 0.70–1.30)
GFR est AA: >60 mL/min/{1.73_m2} (ref >60)
GFR est non-AA: >60 mL/min/{1.73_m2} (ref >60)
Globulin: 3.8 g/dL (ref 2.0–4.0)
Glucose: 196 mg/dL — ABNORMAL HIGH (ref 65–100)
Potassium: 4.1 mmol/L (ref 3.5–5.1)
Protein, total: 6.6 g/dL (ref 6.4–8.2)
Sodium: 135 mmol/L — ABNORMAL LOW (ref 136–145)

## 2019-04-12 LAB — CBC WITH AUTOMATED DIFF
ABS. BASOPHILS: 0 10*3/uL (ref 0.0–0.1)
ABS. EOSINOPHILS: 0.4 10*3/uL (ref 0.0–0.4)
ABS. IMM. GRANS.: 0.1 10*3/uL — ABNORMAL HIGH (ref 0.00–0.04)
ABS. LYMPHOCYTES: 2.6 10*3/uL (ref 0.8–3.5)
ABS. MONOCYTES: 1 10*3/uL (ref 0.0–1.0)
ABS. NEUTROPHILS: 6.6 10*3/uL (ref 1.8–8.0)
ABSOLUTE NRBC: 0 10*3/uL (ref 0.00–0.01)
BASOPHILS: 0 % (ref 0–1)
EOSINOPHILS: 4 % (ref 0–7)
HCT: 43.4 % (ref 36.6–50.3)
HGB: 14.1 g/dL (ref 12.1–17.0)
IMMATURE GRANULOCYTES: 1 % — ABNORMAL HIGH (ref 0.0–0.5)
LYMPHOCYTES: 24 % (ref 12–49)
MCH: 28.9 PG (ref 26.0–34.0)
MCHC: 32.5 g/dL (ref 30.0–36.5)
MCV: 88.9 FL (ref 80.0–99.0)
MONOCYTES: 9 % (ref 5–13)
MPV: 11.5 FL (ref 8.9–12.9)
NEUTROPHILS: 62 % (ref 32–75)
NRBC: 0 PER 100 WBC
PLATELET: 238 10*3/uL (ref 150–400)
RBC: 4.88 M/uL (ref 4.10–5.70)
RDW: 13.6 % (ref 11.5–14.5)
WBC: 10.7 10*3/uL (ref 4.1–11.1)

## 2019-04-12 LAB — GLUCOSE, POC
Glucose (POC): 165 mg/dL — ABNORMAL HIGH (ref 65–100)
Glucose (POC): 239 mg/dL — ABNORMAL HIGH (ref 65–100)
Glucose (POC): 169 mg/dL — ABNORMAL HIGH (ref 65–100)
Glucose (POC): 164 mg/dL — ABNORMAL HIGH (ref 65–100)

## 2019-04-12 LAB — PTT: aPTT: 26.2 s (ref 22.1–32.0)

## 2019-04-12 LAB — D DIMER: D-dimer: 0.43 mg/L FEU (ref 0.00–0.65)

## 2019-04-12 MED FILL — INSULIN LISPRO 100 UNIT/ML INJECTION: 100 unit/mL | SUBCUTANEOUS | Qty: 1

## 2019-04-12 MED FILL — NICOTINE 14 MG/24 HR DAILY PATCH: 14 mg/24 hr | TRANSDERMAL | Qty: 1

## 2019-04-12 MED FILL — FOLIC ACID 1 MG TAB: 1 mg | ORAL | Qty: 1

## 2019-04-12 MED FILL — HEPARIN (PORCINE) 5,000 UNIT/ML IJ SOLN: 5000 unit/mL | INTRAMUSCULAR | Qty: 1

## 2019-04-12 MED FILL — THIAMINE HCL 100 MG TAB: 100 mg | ORAL | Qty: 1

## 2019-04-12 MED FILL — FAMOTIDINE 20 MG TAB: 20 mg | ORAL | Qty: 1

## 2019-04-12 MED FILL — ATORVASTATIN 40 MG TAB: 40 mg | ORAL | Qty: 1

## 2019-04-12 MED FILL — OXYCODONE-ACETAMINOPHEN 5 MG-325 MG TAB: 5-325 mg | ORAL | Qty: 1

## 2019-04-12 MED FILL — NORMAL SALINE FLUSH 0.9 % INJECTION SYRINGE: INTRAMUSCULAR | Qty: 10

## 2019-04-12 MED FILL — LORAZEPAM 2 MG/ML IJ SOLN: 2 mg/mL | INTRAMUSCULAR | Qty: 1

## 2019-04-12 MED FILL — MORPHINE 2 MG/ML INJECTION: 2 mg/mL | INTRAMUSCULAR | Qty: 1

## 2019-04-12 MED FILL — INSULIN NPH HUMAN RECOMB 100 UNIT/ML INJECTION: 100 unit/mL | SUBCUTANEOUS | Qty: 1

## 2019-04-12 MED FILL — THERAPEUTIC MULTIVITAMIN TAB: ORAL | Qty: 1

## 2019-04-12 MED FILL — CHILDREN'S ASPIRIN 81 MG CHEWABLE TABLET: 81 mg | ORAL | Qty: 1

## 2019-04-12 MED FILL — LISINOPRIL 20 MG TAB: 20 mg | ORAL | Qty: 1

## 2019-04-12 NOTE — Progress Notes (Signed)
Bedside shift change report given to Karla, RN (oncoming nurse) by Morgan, RN (offgoing nurse). Report included the following information SBAR, Kardex, Intake/Output and MAR.

## 2019-04-12 NOTE — Progress Notes (Signed)
[name]                                                                                          Hospitalist Progress Note  Derek RathkeMahmud Kostas Marrow, MD  Answering service: [phone] OR [phone] from in house phone        Date of Service:  04/12/2019  NAME:  Derek Mclaughlin  DOB:  09-02-1951  MRN:  [id number]      Admission Summary:   Derek Mclaughlin??is a 68 y.o.??male??who presents with PMH of DM, [name], HTN, [name], COPD here with c/o chest pain??x1 week and worsening??since last night??with associated??N/V and also having shortness of breath and cough.??He describes pain as??tight??heaviness to left side of chest??and radiates up to neck??and jaw. ??He is visiting from??[place]??and??states he was supposed to living with friends when he got here but the house was vacant when he arrived.??Has lived in motel until he ran out of money now homeless.??He has been out of medications??for about 1 week prior to coming to ED. He??states he has a history of alcohol dependence and??restarted drinking alcohol about 2 weeks??prior to coming to ED??and drinks 12 beers/day. He??states he has a hx of bipolar and schizophrenia and has had an increase in symptoms of depression over last few months, he stated??he doesn't care if he lives but denies suicidal and homicidal ideations.He??states he is??having??auditory and visual??hallucinations??x2 weeks where he hears his deceased wifes voice and thinks he has seen her in a room with him.??  Work up in ED: negative CXR, EKG, cardiology consult, ECHO ordered, labs reviewed, psych consult, r/o COVID. 04/07/2019  ??    Interval history / Subjective:   Doing OK no new issues , air mattress bothering him with back pain   His enteric bacterial panel is negative no new issues awaiting for placement      Assessment & Plan:     Atypical Chest Pain:??resolved   - likely due to anxiety  -WGN:FAOZHYEKG:Normal sinus rhythm,??Inferior infarct, age undetermined,??RBBB  -troponin negative x 3 negative   -ECHO: EF 55 - 60%. No regional wall motion abnormality noted. Mild (grade 1) left ventricular diastolic dysfunction.  -cardiology on board  - cardiac cath 6/9: Normal LVEDP.   ??  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 Takes Xanax 2mg  tid last filled in May 20th 90 pills but patientstates he lost his medication while travelling here. Last dose was on Thursday 04/03/2019  - IV ativan prn   ??  Severe anxiety/ Panic attacks   C/w Hallucinations: hx of Bipolar and schizophrenia.??  - Auditory and visual hallucinations of deceased wife.??  -denies HI and SI, not on any mood medications except for Xanax  -seen by psyche , recommendation noted but pt does not want those medications as he thinks that makes him weak   - hydroxyzine prn   ??  DM type II with hyperglycemia  - SSI   - HGA1c 9  - started on NPH 10 U bid. C/w ISS  ??  [name]:   -lipid panel LDL 123  - started on statin  ????  HTN:  BP elevated  - not on any meds per pharmacy  - started on lisinopril 20mg  daily    -prn hydralazine  ??  COPD not in exacerbation  -CXR negative  -guaifenesin as needed for cough, albuterol inhaler as needed  - COVID 19 negative ??  - recommend pulmonology and PFT eval as outpt   ??  ETOH Dependence:   - drinks 12 beers/day.  -CIWA protocol  ??  Tobacco Dependence: current smoker 2ppd-1/2ppd.  -not interested in quitting, counseled for cessation  -nicotine patch  ??  Fall 6/10  - CT head: No significant intracranial abnormalities  - CT neck: Avulsion fracture of the tip of the T1 spinous process, probably chronic  - NSGY consult placed   ??  Homelessness  -case management consult for resources    Code status: Full  DVT prophylaxis: SCD    Care Plan discussed with: Patient/Family and Nurse  Anticipated Disposition: Home w/Family  Anticipated Discharge: 24 hours to 48 hours     Hospital Problems  Date Reviewed: 04/07/2019          Codes Class Noted POA    Schizophrenia (HCC) ICD-10-CM: F20.9  ICD-9-CM: 295.90  04/09/2019 Yes         Bipolar 1 disorder, depressed, severe (HCC) ICD-10-CM: F31.4  ICD-9-CM: 296.53  04/09/2019 Yes        Tobacco abuse ICD-10-CM: Z72.0  ICD-9-CM: 305.1  04/09/2019 Yes        Alcohol abuse ICD-10-CM: F10.10  ICD-9-CM: 305.00  04/09/2019 Yes        Chest pain ICD-10-CM: R07.9  ICD-9-CM: 786.50  04/07/2019 Yes                Review of Systems:   A comprehensive review of systems was negative.       Vital Signs:    Last 24hrs VS reviewed since prior progress note. Most recent are:  Visit Vitals  BP 139/83 (BP 1 Location: Left arm, BP Patient Position: At rest;Sitting)   Pulse 74   Temp 98.5 ??F (36.9 ??C)   Resp 18   Ht 5\' 10"  (1.778 m)   Wt 116.9 kg (257 lb 11.2 oz)   SpO2 96%   BMI 36.98 kg/m??       No intake or output data in the 24 hours ending 04/12/19 1142     Physical Examination:             Constitutional:  No acute distress, cooperative, pleasant    ENT:  Oral mucosa moist, oropharynx benign.    Resp:  CTA bilaterally. No wheezing/rhonchi/rales. No accessory muscle use   CV:  Regular rhythm, normal rate, no murmurs, gallops, rubs    GI:  Soft, non distended, non tender. normoactive bowel sounds, no hepatosplenomegaly     Musculoskeletal:  No edema, warm, 2+ pulses throughout    Neurologic:  Moves all extremities.  AAOx3, CN II-XII reviewed     Psych:  Good insight, positive anxious nor agitated.       Data Review:    I personally reviewed  Image and Lbs      Labs:     Recent Labs     04/12/19  0154   WBC 10.7   HGB 14.1   HCT 43.4   PLT 238     Recent Labs     04/12/19  0154 04/10/19  0314   NA 135* 135*   K 4.1 4.0   CL 105 104   CO2  24 22   BUN 26* 21*   CREA 1.17 1.28   GLU 196* 195*   CA 8.8 8.7   MG 1.8  --    PHOS 3.6  --      Recent Labs     04/12/19  0154   ALT 32   AP 74   TBILI 0.2   TP 6.6   ALB 2.8*   GLOB 3.8     Recent Labs     04/12/19  0154 04/11/19  0405 04/10/19  0314   INR 1.0 1.0 1.0   PTP 10.1 10.4 9.9   APTT 26.2 27.3 27.1       No results for input(s): FE, TIBC, PSAT, FERR in the last 72 hours.   No results found for: FOL, RBCF   No results for input(s): PH, PCO2, PO2 in the last 72 hours.  No results for input(s): CPK, CKNDX, TROIQ in the last 72 hours.    No lab exists for component: CPKMB  Lab Results   Component Value Date/Time    Cholesterol, total 201 (H) 04/08/2019 12:29 AM    HDL Cholesterol 36 04/08/2019 12:29 AM    LDL, calculated 123.2 (H) 29/56/213006/07/2019 12:29 AM    Triglyceride 209 (H) 04/08/2019 12:29 AM    CHOL/HDL Ratio 5.6 (H) 04/08/2019 12:29 AM     Lab Results   Component Value Date/Time    Glucose (POC) 169 (H) 04/12/2019 11:07 AM    Glucose (POC) 239 (H) 04/12/2019 06:25 AM    Glucose (POC) 165 (H) 04/11/2019 09:30 PM    Glucose (POC) 145 (H) 04/11/2019 04:20 PM    Glucose (POC) 233 (H) 04/11/2019 11:07 AM     Lab Results   Component Value Date/Time    Color YELLOW/STRAW 04/08/2019 02:07 AM    Appearance CLEAR 04/08/2019 02:07 AM    Specific gravity 1.019 04/08/2019 02:07 AM    pH (UA) 5.0 04/08/2019 02:07 AM    Protein 100 (A) 04/08/2019 02:07 AM    Glucose Negative 04/08/2019 02:07 AM    Ketone Negative 04/08/2019 02:07 AM    Bilirubin Negative 04/08/2019 02:07 AM    Urobilinogen 0.2 04/08/2019 02:07 AM    Nitrites Negative 04/08/2019 02:07 AM    Leukocyte Esterase Negative 04/08/2019 02:07 AM    Epithelial cells FEW 04/08/2019 02:07 AM    Bacteria Negative 04/08/2019 02:07 AM    WBC 0-4 04/08/2019 02:07 AM    RBC 50-100 04/08/2019 02:07 AM         Medications Reviewed:     Current Facility-Administered Medications   Medication Dose Route Frequency   ??? insulin NPH (NOVOLIN N, HUMULIN N) injection 10 Units  10 Units SubCUTAneous ACB/HS   ??? oxyCODONE-acetaminophen (PERCOCET) 5-325 mg per tablet 1 Tab  1 Tab Oral Q8H PRN   ??? thiamine HCL (B-1) tablet 100 mg  100 mg Oral DAILY   ??? folic acid (FOLVITE) tablet 1 mg  1 mg Oral DAILY   ??? therapeutic multivitamin (THERAGRAN) tablet 1 Tab  1 Tab Oral DAILY    ??? LORazepam (ATIVAN) injection 1 mg  1 mg IntraVENous Q4H PRN   ??? lisinopriL (PRINIVIL, ZESTRIL) tablet 20 mg  20 mg Oral DAILY   ??? sodium chloride (NS) flush 5-40 mL  5-40 mL IntraVENous Q8H   ??? sodium chloride (NS) flush 5-40 mL  5-40 mL IntraVENous PRN   ??? famotidine (PEPCID) tablet 20 mg  20 mg Oral BID   ??? morphine injection  0.5 mg  0.5 mg IntraVENous Q6H PRN   ??? hydrOXYzine HCL (ATARAX) tablet 25 mg  25 mg Oral TID PRN   ??? atorvastatin (LIPITOR) tablet 40 mg  40 mg Oral QHS   ??? sodium chloride (NS) flush 5-40 mL  5-40 mL IntraVENous Q8H   ??? sodium chloride (NS) flush 5-40 mL  5-40 mL IntraVENous PRN   ??? heparin (porcine) injection 5,000 Units  5,000 Units SubCUTAneous Q8H   ??? prochlorperazine (COMPAZINE) tablet 10 mg  10 mg Oral Q6H PRN   ??? glucose chewable tablet 16 g  4 Tab Oral PRN   ??? glucagon (GLUCAGEN) injection 1 mg  1 mg IntraMUSCular PRN   ??? insulin lispro (HUMALOG) injection   SubCUTAneous AC&HS   ??? acetaminophen (TYLENOL) tablet 650 mg  650 mg Oral Q4H PRN   ??? hydrALAZINE (APRESOLINE) 20 mg/mL injection 20 mg  20 mg IntraVENous Q6H PRN   ??? guaiFENesin (ROBITUSSIN) 100 mg/5 mL oral liquid 100 mg  100 mg Oral Q4H PRN   ??? ondansetron (ZOFRAN) injection 4 mg  4 mg IntraVENous Q4H PRN   ??? nicotine (NICODERM CQ) 14 mg/24 hr patch 1 Patch  1 Patch TransDERmal DAILY   ??? albuterol (PROVENTIL HFA, VENTOLIN HFA, PROAIR HFA) inhaler 2 Puff  2 Puff Inhalation Q4H PRN   ??? aspirin chewable tablet 81 mg  81 mg Oral DAILY     ______________________________________________________________________  EXPECTED LENGTH OF STAY: 2d 14h  ACTUAL LENGTH OF STAY:          4                 [name], MD

## 2019-04-12 NOTE — Progress Notes (Signed)
Progress Notes by Lucky Rathke, MD at 04/12/19 1142                Author: Lucky Rathke, MD  Service: Internal Medicine  Author Type: Physician       Filed: 04/12/19 1143  Date of Service: 04/12/19 1142  Status: Signed          Editor: Lucky Rathke, MD (Physician)                    [facility]                                                                                              Hospitalist Progress Note   Lucky Rathke, MD   Answering service: [phone] OR [phone] from in house phone            Date of Service:  04/12/2019   NAME:  Derek [facility]aughlin   DOB:  1951-02-25   MRN:  [id number]           Admission Summary:        Derek Mclaughlin??is a 68 y.o.??male??who presents with PMH of DM, [name], HTN, [name], [name] ??He is visiting from??[place]??and??states he was supposed to living with friends when  he got here but the house was vacant when he arrived.??Has lived in motel until he ran out of money now homeless.??He has been out of medications??for about 1 week prior to coming to ED. He??states he has a history of alcohol dependence and??restarted  drinking alcohol about 2 weeks??prior to coming to ED??and drinks 12 beers/day. He??states he has a hx of bipolar and schizophrenia and has had an increase in symptoms of depression over last few months, he stated??he doesn't care if he lives  but denies suicidal and homicidal ideations.He??states he is??having??auditory and visual??hallucinations??x2 weeks where he hears his deceased wifes voice and thinks he has seen her in a room with him.??   Work up in ED: negative CXR, EKG, cardiology consult, ECHO ordered, labs reviewed, psych consult, r/o COVID. 04/07/2019   ??      Interval history / Subjective:        Doing OK no  new issues , air mattress bothering him with back pain    His enteric bacterial panel is negative no new issues awaiting for placement           Assessment & Plan:          Atypical Chest Pain:??resolved    - likely due to anxiety   -WGN:FAOZHY sinus rhythm,??Inferior infarct, age undetermined,??RBBB   -troponin negative x 3 negative   -ECHO: EF 55 - 60%. No regional wall motion abnormality noted. Mild (grade 1) left ventricular diastolic dysfunction.   -cardiology on board   - cardiac cath 6/9: Normal LVEDP.    ??   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 Takes Xanax 2mg  tid last filled in May 20th 90 pills but patientstates he lost his medication while travelling here. Last dose was on Thursday 04/03/2019   - IV ativan prn    ??   Severe anxiety/ Panic attacks    C/w Hallucinations: hx of Bipolar and schizophrenia.??   - Auditory and visual hallucinations of deceased wife.??   -denies HI and SI, not on any mood medications except for Xanax   -seen by psyche , recommendation noted but pt does not want those medications as he thinks that makes him weak    - hydroxyzine prn    ??   DM type II with hyperglycemia   - SSI    - HGA1c 9   - started on NPH 10 U bid. C/w ISS   ??   [name]:    -lipid panel LDL 123   - started on statin   ????   HTN: BP elevated   - not on any meds per pharmacy   - started on lisinopril 20mg  daily     -prn hydralazine   ??   COPD not in exacerbation   -CXR negative   -guaifenesin as needed for cough, albuterol inhaler as needed   - COVID 19 negative ??   - recommend pulmonology and PFT eval as outpt    ??   ETOH Dependence:    - drinks 12 beers/day.   -CIWA protocol   ??   Tobacco Dependence: current smoker 2ppd-1/2ppd.   -not interested in quitting, counseled for cessation   -nicotine patch   ??   Fall 6/10   - CT head: No significant intracranial abnormalities   - CT neck: Avulsion fracture of the tip of the T1 spinous process, probably chronic   - NSGY consult placed    ??   Homelessness    -case management consult for resources      Code status: Full   DVT prophylaxis: SCD      Care Plan discussed with: Patient/Family and Nurse   Anticipated Disposition: Home w/Family   Anticipated Discharge: 24 hours to 48 hours           Hospital Problems   Date Reviewed:  04/07/2019                         Codes  Class  Noted  POA              Schizophrenia (HCC)  ICD-10-CM: F20.9   ICD-9-CM: 295.90    04/09/2019  Yes                        Bipolar 1 disorder, depressed, severe (HCC)  ICD-10-CM: F31.4   ICD-9-CM: 296.53    04/09/2019  Yes                        Tobacco abuse  ICD-10-CM: Z72.0   ICD-9-CM: 305.1    04/09/2019  Yes                        Alcohol abuse  ICD-10-CM: F10.10   ICD-9-CM: 305.00    04/09/2019  Yes                        Chest pain  ICD-10-CM: R07.9   ICD-9-CM: 786.50    04/07/2019  Yes  Review of Systems:     A comprehensive review of systems was negative.            Vital Signs:      Last 24hrs VS reviewed since prior progress note. Most recent are:   Visit Vitals      BP  139/83 (BP 1 Location: Left arm, BP Patient Position: At rest;Sitting)     Pulse  74     Temp  98.5 ??F (36.9 ??C)     Resp  18     Ht  5\' 10"  (1.778 m)     Wt  116.9 kg (257 lb 11.2 oz)     SpO2  96%        BMI  36.98 kg/m??           No intake or output data in the 24 hours ending 04/12/19 1142         Physical Examination:                     Constitutional:   No acute distress, cooperative, pleasant      ENT:   Oral mucosa moist, oropharynx benign.      Resp:   CTA bilaterally. No wheezing/rhonchi/rales. No accessory muscle use     CV:   Regular rhythm, normal rate, no murmurs, gallops, rubs         GI:   Soft, non distended, non tender. normoactive bowel sounds, no hepatosplenomegaly          Musculoskeletal:   No edema, warm, 2+ pulses throughout         Neurologic:   Moves all extremities.  AAOx3, CN II-XII reviewed       Psych:  Good insight, positive anxious nor agitated.               Data  Review:      I personally reviewed  Image and Lbs           Labs:          Recent Labs           04/12/19   0154     WBC  10.7     HGB  14.1     HCT  43.4        PLT  238          Recent Labs            04/12/19   0154  04/10/19   0314     NA  135*  135*     K  4.1  4.0     CL  105  104     CO2  24  22     BUN  26*  21*     CREA  1.17  1.28     GLU  196*  195*     CA  8.8  8.7     MG  1.8   --          PHOS  3.6   --           Recent Labs           04/12/19   0154     ALT  32     AP  74     TBILI  0.2     TP  6.6     ALB  2.8*        GLOB  3.8  Recent Labs             04/12/19   0154  04/11/19   0405  04/10/19   0314     INR  1.0  1.0  1.0     PTP  10.1  10.4  9.9          APTT  26.2  27.3  27.1         No results for input(s): FE, TIBC, PSAT, FERR in the last 72 hours.    No results found for: FOL, RBCF    No results for input(s): PH, PCO2, PO2 in the last 72 hours.   No results for input(s): CPK, CKNDX, TROIQ in the last 72 hours.      No lab exists for component: CPKMB     Lab Results         Component  Value  Date/Time            Cholesterol, total  201 (H)  04/08/2019 12:29 AM       HDL Cholesterol  36  04/08/2019 12:29 AM       LDL, calculated  123.2 (H)  04/08/2019 12:29 AM       Triglyceride  209 (H)  04/08/2019 12:29 AM            CHOL/HDL Ratio  5.6 (H)  04/08/2019 12:29 AM          Lab Results         Component  Value  Date/Time            Glucose (POC)  169 (H)  04/12/2019 11:07 AM       Glucose (POC)  239 (H)  04/12/2019 06:25 AM       Glucose (POC)  165 (H)  04/11/2019 09:30 PM       Glucose (POC)  145 (H)  04/11/2019 04:20 PM            Glucose (POC)  233 (H)  04/11/2019 11:07 AM          Lab Results         Component  Value  Date/Time            Color  YELLOW/STRAW  04/08/2019 02:07 AM       Appearance  CLEAR  04/08/2019 02:07 AM       Specific gravity  1.019  04/08/2019 02:07 AM       pH (UA)  5.0  04/08/2019 02:07 AM       Protein  100 (A)  04/08/2019 02:07 AM       Glucose  Negative   04/08/2019 02:07 AM       Ketone  Negative  04/08/2019 02:07 AM       Bilirubin  Negative  04/08/2019 02:07 AM       Urobilinogen  0.2  04/08/2019 02:07 AM       Nitrites  Negative  04/08/2019 02:07 AM       Leukocyte Esterase  Negative  04/08/2019 02:07 AM       Epithelial cells  FEW  04/08/2019 02:07 AM       Bacteria  Negative  04/08/2019 02:07 AM       WBC  0-4  04/08/2019 02:07 AM            RBC  50-100  04/08/2019 02:07 AM                Medications Reviewed:  Current Facility-Administered Medications          Medication  Dose  Route  Frequency           ?  insulin NPH (NOVOLIN N, HUMULIN N) injection 10 Units   10 Units  SubCUTAneous  ACB/HS     ?  oxyCODONE-acetaminophen (PERCOCET) 5-325 mg per tablet 1 Tab   1 Tab  Oral  Q8H PRN     ?  thiamine HCL (B-1) tablet 100 mg   100 mg  Oral  DAILY     ?  folic acid (FOLVITE) tablet 1 mg   1 mg  Oral  DAILY     ?  therapeutic multivitamin (THERAGRAN) tablet 1 Tab   1 Tab  Oral  DAILY     ?  LORazepam (ATIVAN) injection 1 mg   1 mg  IntraVENous  Q4H PRN     ?  lisinopriL (PRINIVIL, ZESTRIL) tablet 20 mg   20 mg  Oral  DAILY     ?  sodium chloride (NS) flush 5-40 mL   5-40 mL  IntraVENous  Q8H     ?  sodium chloride (NS) flush 5-40 mL   5-40 mL  IntraVENous  PRN     ?  famotidine (PEPCID) tablet 20 mg   20 mg  Oral  BID     ?  morphine injection 0.5 mg   0.5 mg  IntraVENous  Q6H PRN     ?  hydrOXYzine HCL (ATARAX) tablet 25 mg   25 mg  Oral  TID PRN     ?  atorvastatin (LIPITOR) tablet 40 mg   40 mg  Oral  QHS     ?  sodium chloride (NS) flush 5-40 mL   5-40 mL  IntraVENous  Q8H     ?  sodium chloride (NS) flush 5-40 mL   5-40 mL  IntraVENous  PRN     ?  heparin (porcine) injection 5,000 Units   5,000 Units  SubCUTAneous  Q8H     ?  prochlorperazine (COMPAZINE) tablet 10 mg   10 mg  Oral  Q6H PRN     ?  glucose chewable tablet 16 g   4 Tab  Oral  PRN           ?  glucagon (GLUCAGEN) injection 1 mg   1 mg  IntraMUSCular  PRN           ?  insulin lispro  (HUMALOG) injection     SubCUTAneous  AC&HS     ?  acetaminophen (TYLENOL) tablet 650 mg   650 mg  Oral  Q4H PRN     ?  hydrALAZINE (APRESOLINE) 20 mg/mL injection 20 mg   20 mg  IntraVENous  Q6H PRN     ?  guaiFENesin (ROBITUSSIN) 100 mg/5 mL oral liquid 100 mg   100 mg  Oral  Q4H PRN     ?  ondansetron (ZOFRAN) injection 4 mg   4 mg  IntraVENous  Q4H PRN     ?  nicotine (NICODERM CQ) 14 mg/24 hr patch 1 Patch   1 Patch  TransDERmal  DAILY     ?  albuterol (PROVENTIL HFA, VENTOLIN HFA, PROAIR HFA) inhaler 2 Puff   2 Puff  Inhalation  Q4H PRN           ?  aspirin chewable tablet 81 mg   81 mg  Oral  DAILY  ______________________________________________________________________   EXPECTED LENGTH OF STAY: 2d 14h   ACTUAL LENGTH OF STAY:          4                    Lucky Rathke, MD

## 2019-04-12 NOTE — Progress Notes (Signed)
Bedside shift change report given to Paula Compton, Charity fundraiser (oncoming nurse) by Lequita Halt, RN (offgoing nurse). Report included the following information SBAR, Kardex, Intake/Output and MAR.

## 2019-04-13 LAB — POCT GLUCOSE
POC Glucose: 244 mg/dL — ABNORMAL HIGH (ref 65–100)
POC Glucose: 182 mg/dL — ABNORMAL HIGH (ref 65–100)
POC Glucose: 224 mg/dL — ABNORMAL HIGH (ref 65–100)
POC Glucose: 162 mg/dL — ABNORMAL HIGH (ref 65–100)

## 2019-04-13 LAB — PROTIME-INR
INR: 1 (ref 0.9–1.1)
Protime: 10 s (ref 9.0–11.1)

## 2019-04-13 LAB — D-DIMER, QUANTITATIVE: D-Dimer, Quant: 0.45 mg/L FEU (ref 0.00–0.65)

## 2019-04-13 LAB — APTT: aPTT: 26.3 s (ref 22.1–32.0)

## 2019-04-13 LAB — FIBRINOGEN
Fibrinogen: 565 mg/dL — ABNORMAL HIGH (ref 200–475)
Fibrinogen: 565 mg/dL — ABNORMAL HIGH (ref 200–475)

## 2019-04-13 LAB — GLUCOSE, POC
Glucose (POC): 244 mg/dL — ABNORMAL HIGH (ref 65–100)
Glucose (POC): 182 mg/dL — ABNORMAL HIGH (ref 65–100)
Glucose (POC): 224 mg/dL — ABNORMAL HIGH (ref 65–100)
Glucose (POC): 162 mg/dL — ABNORMAL HIGH (ref 65–100)

## 2019-04-13 LAB — D DIMER: D-dimer: 0.45 mg/L FEU (ref 0.00–0.65)

## 2019-04-13 LAB — PROTHROMBIN TIME + INR
INR: 1 (ref 0.9–1.1)
Prothrombin time: 10 s (ref 9.0–11.1)

## 2019-04-13 LAB — PTT: aPTT: 26.3 s (ref 22.1–32.0)

## 2019-04-13 MED FILL — THIAMINE HCL 100 MG TAB: 100 mg | ORAL | Qty: 1

## 2019-04-13 MED FILL — NORMAL SALINE FLUSH 0.9 % INJECTION SYRINGE: INTRAMUSCULAR | Qty: 10

## 2019-04-13 MED FILL — MORPHINE 2 MG/ML INJECTION: 2 mg/mL | INTRAMUSCULAR | Qty: 1

## 2019-04-13 MED FILL — INSULIN LISPRO 100 UNIT/ML INJECTION: 100 unit/mL | SUBCUTANEOUS | Qty: 1

## 2019-04-13 MED FILL — HEPARIN (PORCINE) 5,000 UNIT/ML IJ SOLN: 5000 unit/mL | INTRAMUSCULAR | Qty: 1

## 2019-04-13 MED FILL — FAMOTIDINE 20 MG TAB: 20 mg | ORAL | Qty: 1

## 2019-04-13 MED FILL — OXYCODONE-ACETAMINOPHEN 5 MG-325 MG TAB: 5-325 mg | ORAL | Qty: 1

## 2019-04-13 MED FILL — LORAZEPAM 2 MG/ML IJ SOLN: 2 mg/mL | INTRAMUSCULAR | Qty: 1

## 2019-04-13 MED FILL — INSULIN NPH HUMAN RECOMB 100 UNIT/ML INJECTION: 100 unit/mL | SUBCUTANEOUS | Qty: 1

## 2019-04-13 MED FILL — LISINOPRIL 20 MG TAB: 20 mg | ORAL | Qty: 1

## 2019-04-13 MED FILL — NICOTINE 14 MG/24 HR DAILY PATCH: 14 mg/24 hr | TRANSDERMAL | Qty: 1

## 2019-04-13 MED FILL — CHILDREN'S ASPIRIN 81 MG CHEWABLE TABLET: 81 mg | ORAL | Qty: 1

## 2019-04-13 MED FILL — ATORVASTATIN 40 MG TAB: 40 mg | ORAL | Qty: 1

## 2019-04-13 MED FILL — THERAPEUTIC MULTIVITAMIN TAB: ORAL | Qty: 1

## 2019-04-13 MED FILL — FOLIC ACID 1 MG TAB: 1 mg | ORAL | Qty: 1

## 2019-04-13 NOTE — Progress Notes (Signed)
Patient IV leaky at this time. New IV placed to left forearm. Patient inquiring about status of placement post discharge. This RN explained the CM would be by to speak with him on Monday

## 2019-04-13 NOTE — Progress Notes (Addendum)
Patient asking RN if he knows where he could get a suitcase. RN stated possibly case management could assist patient in getting a suitcase. Patient worried about carrying his things when he goes to another facility in plastic bags. RN will pass this information along to day shift nurse to see if case management could possibly help.   0535: Patient asked again about if RN had any resources to suitcases, patient was tearful during this conversation. RN told patient she would pass this along to his dayshift nurse so case management could get involved. Patient stable will continue to monitor.

## 2019-04-13 NOTE — Progress Notes (Signed)
Bedside and Verbal shift change report given to Maddie RN (oncoming nurse) by Alyssa RN (offgoing nurse). Report included the following information SBAR and Kardex.

## 2019-04-13 NOTE — Other (Signed)
Bedside shift change report given to Kaycee RN (oncoming nurse) by Maddie RN (offgoing nurse). Report included the following information SBAR, Kardex, Intake/Output, MAR and Recent Results.

## 2019-04-13 NOTE — Progress Notes (Signed)
[name]                                                                                          Hospitalist Progress Note  Lucky RathkeMahmud Kathlyne Loud, MD  Answering service: [phone] OR [phone] from in house phone        Date of Service:  04/13/2019  NAME:  Derek [facility]claughlin  DOB:  [birth year]  MRN:  [id number]      Admission Summary:   Derek Mclaughlin a 68 y.o.??male??who presents with PMH of DM, [name], HTN, [name], COPD here with c/o chest pain??x1 week and worsening??since last night??with associated??N/V and also having shortness of breath and cough.??He describes pain as??tight??heaviness to left side of chest??and radiates up to neck??and jaw. ??He is visiting from??[place]??and??states he was supposed to living with friends when he got here but the house was vacant when he arrived.??Has lived in motel until he ran out of money now homeless.??He has been out of medications??for about 1 week prior to coming to ED. He??states he has a history of alcohol dependence and??restarted drinking alcohol about 2 weeks??prior to coming to ED??and drinks 12 beers/day. He??states he has a hx of bipolar and schizophrenia and has had an increase in symptoms of depression over last few months, he stated??he doesn't care if he lives but denies suicidal and homicidal ideations.He??states he is??having??auditory and visual??hallucinations??x2 weeks where he hears his deceased wifes voice and thinks he has seen her in a room with him.??  Work up in ED: negative CXR, EKG, cardiology consult, ECHO ordered, labs reviewed, psych consult, r/o COVID. 04/07/2019  ??    Interval history / Subjective:   Doing OK no new issues , air mattress bothering him with back pain   His enteric bacterial panel is negative no new issues awaiting for placement      Assessment & Plan:     Atypical Chest Pain:??resolved   - likely due to anxiety  -WGN:FAOZHYEKG:Normal sinus rhythm,??Inferior infarct, age undetermined,??RBBB  -troponin negative x 3 negative   -ECHO: EF 55 - 60%. No regional wall motion abnormality noted. Mild (grade 1) left ventricular diastolic dysfunction.  -cardiology on board  - cardiac cath 6/9: Normal LVEDP.   ??  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 Takes Xanax 2mg  tid last filled in May 20th 90 pills but patientstates he lost his medication while travelling here. Last dose was on Thursday 04/03/2019  - IV ativan prn   ??  Severe anxiety/ Panic attacks   C/w Hallucinations: hx of Bipolar and schizophrenia.??  - Auditory and visual hallucinations of deceased wife.??  -denies HI and SI, not on any mood medications except for Xanax  -seen by psyche , recommendation noted but pt does not want those medications as he thinks that makes him weak   - hydroxyzine prn   ??  DM type II with hyperglycemia  - SSI   - HGA1c 9  - started on NPH 10 U bid. C/w ISS  ??  [name]:   -lipid panel LDL 123  - started on statin  ????  HTN:  BP elevated  - not on any meds per pharmacy  - started on lisinopril 20mg  daily    -prn hydralazine  ??  COPD not in exacerbation  -CXR negative  -guaifenesin as needed for cough, albuterol inhaler as needed  - COVID 19 negative ??  - recommend pulmonology and PFT eval as outpt   ??  ETOH Dependence:   - drinks 12 beers/day.  -CIWA protocol  ??  Tobacco Dependence: current smoker 2ppd-1/2ppd.  -not interested in quitting, counseled for cessation  -nicotine patch  ??  Fall 6/10  - CT head: No significant intracranial abnormalities  - CT neck: Avulsion fracture of the tip of the T1 spinous process, probably chronic  - NSGY consult placed   ??  Homelessness  -case management consult for resources    Code status: Full  DVT prophylaxis: SCD    Care Plan discussed with: Patient/Family and Nurse  Anticipated Disposition: Home w/Family  Anticipated Discharge: 24 hours to 48 hours     Hospital Problems  Date Reviewed: 04/07/2019          Codes Class Noted POA    Schizophrenia (HCC) ICD-10-CM: F20.9  ICD-9-CM: 295.90  04/09/2019 Yes         Bipolar 1 disorder, depressed, severe (HCC) ICD-10-CM: F31.4  ICD-9-CM: 296.53  04/09/2019 Yes        Tobacco abuse ICD-10-CM: Z72.0  ICD-9-CM: 305.1  04/09/2019 Yes        Alcohol abuse ICD-10-CM: F10.10  ICD-9-CM: 305.00  04/09/2019 Yes        Chest pain ICD-10-CM: R07.9  ICD-9-CM: 786.50  04/07/2019 Yes                Review of Systems:   A comprehensive review of systems was negative.       Vital Signs:    Last 24hrs VS reviewed since prior progress note. Most recent are:  Visit Vitals  BP 121/65 (BP 1 Location: Left arm, BP Patient Position: At rest)   Pulse 69   Temp 98.2 ??F (36.8 ??C)   Resp 18   Ht 5\' 10"  (1.778 m)   Wt 116.9 kg (257 lb 11.2 oz)   SpO2 95%   BMI 36.98 kg/m??       No intake or output data in the 24 hours ending 04/13/19 0935     Physical Examination:             Constitutional:  No acute distress, cooperative, pleasant    ENT:  Oral mucosa moist, oropharynx benign.    Resp:  CTA bilaterally. No wheezing/rhonchi/rales. No accessory muscle use   CV:  Regular rhythm, normal rate, no murmurs, gallops, rubs    GI:  Soft, non distended, non tender. normoactive bowel sounds, no hepatosplenomegaly     Musculoskeletal:  No edema, warm, 2+ pulses throughout    Neurologic:  Moves all extremities.  AAOx3, CN II-XII reviewed     Psych:  Good insight, positive anxious nor agitated.       Data Review:    I personally reviewed  Image and Lbs      Labs:     Recent Labs     04/12/19  0154   WBC 10.7   HGB 14.1   HCT 43.4   PLT 238     Recent Labs     04/12/19  0154   NA 135*   K 4.1   CL 105   CO2 24   BUN 26*  CREA 1.17   GLU 196*   CA 8.8   MG 1.8   PHOS 3.6     Recent Labs     04/12/19  0154   ALT 32   AP 74   TBILI 0.2   TP 6.6   ALB 2.8*   GLOB 3.8     Recent Labs     04/13/19  0350 04/12/19  0154 04/11/19  0405   INR 1.0 1.0 1.0   PTP 10.0 10.1 10.4   APTT 26.3 26.2 27.3      No results for input(s): FE, TIBC, PSAT, FERR in the last 72 hours.   No results found for: FOL, RBCF    No results for input(s): PH, PCO2, PO2 in the last 72 hours.  No results for input(s): CPK, CKNDX, TROIQ in the last 72 hours.    No lab exists for component: CPKMB  Lab Results   Component Value Date/Time    Cholesterol, total 201 (H) 04/08/2019 12:29 AM    HDL Cholesterol 36 04/08/2019 12:29 AM    LDL, calculated 123.2 (H) 30/86/578406/07/2019 12:29 AM    Triglyceride 209 (H) 04/08/2019 12:29 AM    CHOL/HDL Ratio 5.6 (H) 04/08/2019 12:29 AM     Lab Results   Component Value Date/Time    Glucose (POC) 182 (H) 04/13/2019 06:06 AM    Glucose (POC) 244 (H) 04/12/2019 09:06 PM    Glucose (POC) 164 (H) 04/12/2019 04:54 PM    Glucose (POC) 169 (H) 04/12/2019 11:07 AM    Glucose (POC) 239 (H) 04/12/2019 06:25 AM     Lab Results   Component Value Date/Time    Color YELLOW/STRAW 04/08/2019 02:07 AM    Appearance CLEAR 04/08/2019 02:07 AM    Specific gravity 1.019 04/08/2019 02:07 AM    pH (UA) 5.0 04/08/2019 02:07 AM    Protein 100 (A) 04/08/2019 02:07 AM    Glucose Negative 04/08/2019 02:07 AM    Ketone Negative 04/08/2019 02:07 AM    Bilirubin Negative 04/08/2019 02:07 AM    Urobilinogen 0.2 04/08/2019 02:07 AM    Nitrites Negative 04/08/2019 02:07 AM    Leukocyte Esterase Negative 04/08/2019 02:07 AM    Epithelial cells FEW 04/08/2019 02:07 AM    Bacteria Negative 04/08/2019 02:07 AM    WBC 0-4 04/08/2019 02:07 AM    RBC 50-100 04/08/2019 02:07 AM         Medications Reviewed:     Current Facility-Administered Medications   Medication Dose Route Frequency   ??? insulin NPH (NOVOLIN N, HUMULIN N) injection 10 Units  10 Units SubCUTAneous ACB/HS   ??? oxyCODONE-acetaminophen (PERCOCET) 5-325 mg per tablet 1 Tab  1 Tab Oral Q8H PRN   ??? thiamine HCL (B-1) tablet 100 mg  100 mg Oral DAILY   ??? folic acid (FOLVITE) tablet 1 mg  1 mg Oral DAILY   ??? therapeutic multivitamin (THERAGRAN) tablet 1 Tab  1 Tab Oral DAILY   ??? LORazepam (ATIVAN) injection 1 mg  1 mg IntraVENous Q4H PRN    ??? lisinopriL (PRINIVIL, ZESTRIL) tablet 20 mg  20 mg Oral DAILY   ??? sodium chloride (NS) flush 5-40 mL  5-40 mL IntraVENous Q8H   ??? sodium chloride (NS) flush 5-40 mL  5-40 mL IntraVENous PRN   ??? famotidine (PEPCID) tablet 20 mg  20 mg Oral BID   ??? morphine injection 0.5 mg  0.5 mg IntraVENous Q6H PRN   ??? hydrOXYzine HCL (ATARAX) tablet 25 mg  25 mg Oral TID PRN   ??? atorvastatin (LIPITOR) tablet 40 mg  40 mg Oral QHS   ??? sodium chloride (NS) flush 5-40 mL  5-40 mL IntraVENous Q8H   ??? sodium chloride (NS) flush 5-40 mL  5-40 mL IntraVENous PRN   ??? heparin (porcine) injection 5,000 Units  5,000 Units SubCUTAneous Q8H   ??? prochlorperazine (COMPAZINE) tablet 10 mg  10 mg Oral Q6H PRN   ??? glucose chewable tablet 16 g  4 Tab Oral PRN   ??? glucagon (GLUCAGEN) injection 1 mg  1 mg IntraMUSCular PRN   ??? insulin lispro (HUMALOG) injection   SubCUTAneous AC&HS   ??? acetaminophen (TYLENOL) tablet 650 mg  650 mg Oral Q4H PRN   ??? hydrALAZINE (APRESOLINE) 20 mg/mL injection 20 mg  20 mg IntraVENous Q6H PRN   ??? guaiFENesin (ROBITUSSIN) 100 mg/5 mL oral liquid 100 mg  100 mg Oral Q4H PRN   ??? ondansetron (ZOFRAN) injection 4 mg  4 mg IntraVENous Q4H PRN   ??? nicotine (NICODERM CQ) 14 mg/24 hr patch 1 Patch  1 Patch TransDERmal DAILY   ??? albuterol (PROVENTIL HFA, VENTOLIN HFA, PROAIR HFA) inhaler 2 Puff  2 Puff Inhalation Q4H PRN   ??? aspirin chewable tablet 81 mg  81 mg Oral DAILY     ______________________________________________________________________  EXPECTED LENGTH OF STAY: 2d 14h  ACTUAL LENGTH OF STAY:          5                 [name], MD

## 2019-04-13 NOTE — Progress Notes (Signed)
Patient IV leaky at this time. New IV placed to left forearm. Patient inquiring about status of placement post discharge. This RN explained the CM would be by to speak with him on Monday

## 2019-04-13 NOTE — Progress Notes (Signed)
Patient asking RN if he knows where he could get a suitcase. RN stated possibly case management could assist patient in getting a suitcase. Patient worried about carrying his things when he goes to another facility in plastic bags. RN will pass this information along to day shift nurse to see if case management could possibly help.   0535: Patient asked again about if RN had any resources to suitcases, patient was tearful during this conversation. RN told patient she would pass this along to his dayshift nurse so case management could get involved. Patient stable will continue to monitor.

## 2019-04-13 NOTE — Progress Notes (Signed)
Progress Notes by Lucky RathkeUzzaman, Miyu Fenderson, MD at 04/13/19 0935                Author: Lucky RathkeUzzaman, Mariya Mottley, MD  Service: Internal Medicine  Author Type: Physician       Filed: 04/13/19 0935  Date of Service: 04/13/19 0935  Status: Signed          Editor: Lucky RathkeUzzaman, Kenard Morawski, MD (Physician)                    [name]                                                                                              Hospitalist Progress Note   Lucky RathkeMahmud Suraiya Dickerson, MD   Answering service: [phone] OR [phone] from in house phone            Date of Service:  04/13/2019   NAME:  Derek PummelRobert Mclaughlin   DOB:  1951/09/18   MRN:  [id number]           Admission Summary:        Derek MaduroRobert Hutmacher??is a 68 y.o.??male??who presents with PMH of DM, [name], HTN, [name], [name] ??He is visiting from??[place]??and??states he was supposed to living with friends when  he got here but the house was vacant when he arrived.??Has lived in motel until he ran out of money now homeless.??He has been out of medications??for about 1 week prior to coming to ED. He??states he has a history of alcohol dependence and??restarted  drinking alcohol about 2 weeks??prior to coming to ED??and drinks 12 beers/day. He??states he has a hx of bipolar and schizophrenia and has had an increase in symptoms of depression over last few months, he stated??he doesn't care if he lives  but denies suicidal and homicidal ideations.He??states he is??having??auditory and visual??hallucinations??x2 weeks where he hears his deceased wifes voice and thinks he has seen her in a room with him.??   Work up in ED: negative CXR, EKG, cardiology consult, ECHO ordered, labs reviewed, psych consult, r/o COVID. 04/07/2019   ??      Interval history / Subjective:        Doing OK no  new issues , air mattress bothering him with back pain    His enteric bacterial panel is negative no new issues awaiting for placement           Assessment & Plan:          Atypical Chest Pain:??resolved    - likely due to anxiety   -WGN:FAOZHYEKG:Normal sinus rhythm,??Inferior infarct, age undetermined,??RBBB   -troponin negative x 3 negative   -ECHO: EF 55 - 60%. No regional wall motion abnormality noted. Mild (grade 1) left ventricular diastolic dysfunction.   -cardiology on board   - cardiac cath 6/9: Normal LVEDP.    ??   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 Takes Xanax 2mg  tid last filled in May 20th 90 pills but patientstates he lost his medication while travelling here. Last dose was on Thursday 04/03/2019   - IV ativan prn    ??   Severe anxiety/ Panic attacks    C/w Hallucinations: hx of Bipolar and schizophrenia.??   - Auditory and visual hallucinations of deceased wife.??   -denies HI and SI, not on any mood medications except for Xanax   -seen by psyche , recommendation noted but pt does not want those medications as he thinks that makes him weak    - hydroxyzine prn    ??   DM type II with hyperglycemia   - SSI    - HGA1c 9   - started on NPH 10 U bid. C/w ISS   ??   [name]:    -lipid panel LDL 123   - started on statin   ????   HTN: BP elevated   - not on any meds per pharmacy   - started on lisinopril 20mg  daily     -prn hydralazine   ??   COPD not in exacerbation   -CXR negative   -guaifenesin as needed for cough, albuterol inhaler as needed   - COVID 19 negative ??   - recommend pulmonology and PFT eval as outpt    ??   ETOH Dependence:    - drinks 12 beers/day.   -CIWA protocol   ??   Tobacco Dependence: current smoker 2ppd-1/2ppd.   -not interested in quitting, counseled for cessation   -nicotine patch   ??   Fall 6/10   - CT head: No significant intracranial abnormalities   - CT neck: Avulsion fracture of the tip of the T1 spinous process, probably chronic   - NSGY consult placed    ??   Homelessness    -case management consult for resources      Code status: Full   DVT prophylaxis: SCD      Care Plan discussed with: Patient/Family and Nurse   Anticipated Disposition: Home w/Family   Anticipated Discharge: 24 hours to 48 hours           Hospital Problems   Date Reviewed:  04/07/2019                         Codes  Class  Noted  POA              Schizophrenia (HCC)  ICD-10-CM: F20.9   ICD-9-CM: 295.90    04/09/2019  Yes                        Bipolar 1 disorder, depressed, severe (HCC)  ICD-10-CM: F31.4   ICD-9-CM: 296.53    04/09/2019  Yes                        Tobacco abuse  ICD-10-CM: Z72.0   ICD-9-CM: 305.1    04/09/2019  Yes                        Alcohol abuse  ICD-10-CM: F10.10   ICD-9-CM: 305.00    04/09/2019  Yes                        Chest pain  ICD-10-CM: R07.9   ICD-9-CM: 786.50    04/07/2019  Yes  Review of Systems:     A comprehensive review of systems was negative.            Vital Signs:      Last 24hrs VS reviewed since prior progress note. Most recent are:   Visit Vitals      BP  121/65 (BP 1 Location: Left arm, BP Patient Position: At rest)     Pulse  69     Temp  98.2 ??F (36.8 ??C)     Resp  18     Ht  5\' 10"  (1.778 m)     Wt  116.9 kg (257 lb 11.2 oz)     SpO2  95%        BMI  36.98 kg/m??           No intake or output data in the 24 hours ending 04/13/19 0935         Physical Examination:                     Constitutional:   No acute distress, cooperative, pleasant      ENT:   Oral mucosa moist, oropharynx benign.      Resp:   CTA bilaterally. No wheezing/rhonchi/rales. No accessory muscle use     CV:   Regular rhythm, normal rate, no murmurs, gallops, rubs         GI:   Soft, non distended, non tender. normoactive bowel sounds, no hepatosplenomegaly          Musculoskeletal:   No edema, warm, 2+ pulses throughout         Neurologic:   Moves all extremities.  AAOx3, CN II-XII reviewed       Psych:  Good insight, positive anxious nor agitated.               Data Review:       I personally reviewed  Image and Lbs           Labs:          Recent Labs           04/12/19   0154     WBC  10.7     HGB  14.1     HCT  43.4        PLT  238          Recent Labs           04/12/19   0154     NA  135*     K  4.1     CL  105     CO2  24     BUN  26*     CREA  1.17     GLU  196*     CA  8.8     MG  1.8        PHOS  3.6          Recent Labs           04/12/19   0154     ALT  32     AP  74     TBILI  0.2     TP  6.6     ALB  2.8*        GLOB  3.8          Recent Labs             04/13/19   0350  04/12/19  0154  04/11/19   0405     INR  1.0  1.0  1.0     PTP  10.0  10.1  10.4          APTT  26.3  26.2  27.3         No results for input(s): FE, TIBC, PSAT, FERR in the last 72 hours.    No results found for: FOL, RBCF    No results for input(s): PH, PCO2, PO2 in the last 72 hours.   No results for input(s): CPK, CKNDX, TROIQ in the last 72 hours.      No lab exists for component: CPKMB     Lab Results         Component  Value  Date/Time            Cholesterol, total  201 (H)  04/08/2019 12:29 AM       HDL Cholesterol  36  04/08/2019 12:29 AM       LDL, calculated  123.2 (H)  04/08/2019 12:29 AM       Triglyceride  209 (H)  04/08/2019 12:29 AM            CHOL/HDL Ratio  5.6 (H)  04/08/2019 12:29 AM          Lab Results         Component  Value  Date/Time            Glucose (POC)  182 (H)  04/13/2019 06:06 AM       Glucose (POC)  244 (H)  04/12/2019 09:06 PM       Glucose (POC)  164 (H)  04/12/2019 04:54 PM       Glucose (POC)  169 (H)  04/12/2019 11:07 AM            Glucose (POC)  239 (H)  04/12/2019 06:25 AM          Lab Results         Component  Value  Date/Time            Color  YELLOW/STRAW  04/08/2019 02:07 AM       Appearance  CLEAR  04/08/2019 02:07 AM       Specific gravity  1.019  04/08/2019 02:07 AM       pH (UA)  5.0  04/08/2019 02:07 AM       Protein  100 (A)  04/08/2019 02:07 AM       Glucose  Negative  04/08/2019 02:07 AM       Ketone  Negative  04/08/2019 02:07 AM       Bilirubin  Negative   04/08/2019 02:07 AM       Urobilinogen  0.2  04/08/2019 02:07 AM       Nitrites  Negative  04/08/2019 02:07 AM       Leukocyte Esterase  Negative  04/08/2019 02:07 AM       Epithelial cells  FEW  04/08/2019 02:07 AM       Bacteria  Negative  04/08/2019 02:07 AM       WBC  0-4  04/08/2019 02:07 AM            RBC  50-100  04/08/2019 02:07 AM                Medications Reviewed:          Current Facility-Administered Medications          Medication  Dose  Route  Frequency           ?  insulin NPH (NOVOLIN N, HUMULIN N) injection 10 Units   10 Units  SubCUTAneous  ACB/HS     ?  oxyCODONE-acetaminophen (PERCOCET) 5-325 mg per tablet 1 Tab   1 Tab  Oral  Q8H PRN     ?  thiamine HCL (B-1) tablet 100 mg   100 mg  Oral  DAILY     ?  folic acid (FOLVITE) tablet 1 mg   1 mg  Oral  DAILY     ?  therapeutic multivitamin (THERAGRAN) tablet 1 Tab   1 Tab  Oral  DAILY     ?  LORazepam (ATIVAN) injection 1 mg   1 mg  IntraVENous  Q4H PRN     ?  lisinopriL (PRINIVIL, ZESTRIL) tablet 20 mg   20 mg  Oral  DAILY     ?  sodium chloride (NS) flush 5-40 mL   5-40 mL  IntraVENous  Q8H     ?  sodium chloride (NS) flush 5-40 mL   5-40 mL  IntraVENous  PRN     ?  famotidine (PEPCID) tablet 20 mg   20 mg  Oral  BID     ?  morphine injection 0.5 mg   0.5 mg  IntraVENous  Q6H PRN     ?  hydrOXYzine HCL (ATARAX) tablet 25 mg   25 mg  Oral  TID PRN     ?  atorvastatin (LIPITOR) tablet 40 mg   40 mg  Oral  QHS     ?  sodium chloride (NS) flush 5-40 mL   5-40 mL  IntraVENous  Q8H     ?  sodium chloride (NS) flush 5-40 mL   5-40 mL  IntraVENous  PRN     ?  heparin (porcine) injection 5,000 Units   5,000 Units  SubCUTAneous  Q8H     ?  prochlorperazine (COMPAZINE) tablet 10 mg   10 mg  Oral  Q6H PRN     ?  glucose chewable tablet 16 g   4 Tab  Oral  PRN           ?  glucagon (GLUCAGEN) injection 1 mg   1 mg  IntraMUSCular  PRN           ?  insulin lispro (HUMALOG) injection     SubCUTAneous  AC&HS     ?  acetaminophen (TYLENOL) tablet 650 mg   650 mg   Oral  Q4H PRN     ?  hydrALAZINE (APRESOLINE) 20 mg/mL injection 20 mg   20 mg  IntraVENous  Q6H PRN     ?  guaiFENesin (ROBITUSSIN) 100 mg/5 mL oral liquid 100 mg   100 mg  Oral  Q4H PRN     ?  ondansetron (ZOFRAN) injection 4 mg   4 mg  IntraVENous  Q4H PRN     ?  nicotine (NICODERM CQ) 14 mg/24 hr patch 1 Patch   1 Patch  TransDERmal  DAILY     ?  albuterol (PROVENTIL HFA, VENTOLIN HFA, PROAIR HFA) inhaler 2 Puff   2 Puff  Inhalation  Q4H PRN           ?  aspirin chewable tablet 81 mg   81 mg  Oral  DAILY        ______________________________________________________________________   EXPECTED LENGTH OF STAY: 2d 14h   ACTUAL  LENGTH OF STAY:          5                    Lucky RathkeMahmud Lakiah Dhingra, MD

## 2019-04-14 LAB — FIBRINOGEN
Fibrinogen: 510 mg/dL — ABNORMAL HIGH (ref 200–475)
Fibrinogen: 510 mg/dL — ABNORMAL HIGH (ref 200–475)

## 2019-04-14 LAB — D-DIMER, QUANTITATIVE: D-Dimer, Quant: 0.46 mg/L FEU (ref 0.00–0.65)

## 2019-04-14 LAB — POCT GLUCOSE
POC Glucose: 372 mg/dL — ABNORMAL HIGH (ref 65–100)
POC Glucose: 427 mg/dL — ABNORMAL HIGH (ref 65–100)
POC Glucose: 190 mg/dL — ABNORMAL HIGH (ref 65–100)
POC Glucose: 193 mg/dL — ABNORMAL HIGH (ref 65–100)

## 2019-04-14 LAB — APTT: aPTT: 26.9 s (ref 22.1–32.0)

## 2019-04-14 LAB — PROTIME-INR
INR: 1 (ref 0.9–1.1)
Protime: 10.3 s (ref 9.0–11.1)

## 2019-04-14 LAB — GLUCOSE, POC
Glucose (POC): 372 mg/dL — ABNORMAL HIGH (ref 65–100)
Glucose (POC): 427 mg/dL — ABNORMAL HIGH (ref 65–100)
Glucose (POC): 190 mg/dL — ABNORMAL HIGH (ref 65–100)
Glucose (POC): 193 mg/dL — ABNORMAL HIGH (ref 65–100)

## 2019-04-14 LAB — D DIMER: D-dimer: 0.46 mg/L FEU (ref 0.00–0.65)

## 2019-04-14 LAB — PTT: aPTT: 26.9 s (ref 22.1–32.0)

## 2019-04-14 LAB — PROTHROMBIN TIME + INR
INR: 1 (ref 0.9–1.1)
Prothrombin time: 10.3 s (ref 9.0–11.1)

## 2019-04-14 MED ORDER — INSULIN NPH HUMAN RECOMB 100 UNIT/ML INJECTION
100 unit/mL | Freq: Two times a day (BID) | SUBCUTANEOUS | Status: DC
Start: 2019-04-14 — End: 2019-04-16
  Administered 2019-04-15 – 2019-04-16 (×4): via SUBCUTANEOUS

## 2019-04-14 MED ORDER — INSULIN LISPRO 100 UNIT/ML INJECTION
100 unit/mL | Freq: Once | SUBCUTANEOUS | Status: AC
Start: 2019-04-14 — End: 2019-04-14
  Administered 2019-04-14: 11:00:00 via SUBCUTANEOUS

## 2019-04-14 MED ORDER — SENNOSIDES-DOCUSATE SODIUM 8.6 MG-50 MG TAB
Freq: Every day | ORAL | Status: DC
Start: 2019-04-14 — End: 2019-04-18
  Administered 2019-04-15 – 2019-04-18 (×4): via ORAL

## 2019-04-14 MED FILL — INSULIN NPH HUMAN RECOMB 100 UNIT/ML INJECTION: 100 unit/mL | SUBCUTANEOUS | Qty: 1

## 2019-04-14 MED FILL — HEPARIN (PORCINE) 5,000 UNIT/ML IJ SOLN: 5000 unit/mL | INTRAMUSCULAR | Qty: 1

## 2019-04-14 MED FILL — LISINOPRIL 20 MG TAB: 20 mg | ORAL | Qty: 1

## 2019-04-14 MED FILL — CHILDREN'S ASPIRIN 81 MG CHEWABLE TABLET: 81 mg | ORAL | Qty: 1

## 2019-04-14 MED FILL — INSULIN LISPRO 100 UNIT/ML INJECTION: 100 unit/mL | SUBCUTANEOUS | Qty: 1

## 2019-04-14 MED FILL — THIAMINE HCL 100 MG TAB: 100 mg | ORAL | Qty: 1

## 2019-04-14 MED FILL — FAMOTIDINE 20 MG TAB: 20 mg | ORAL | Qty: 1

## 2019-04-14 MED FILL — LORAZEPAM 2 MG/ML IJ SOLN: 2 mg/mL | INTRAMUSCULAR | Qty: 1

## 2019-04-14 MED FILL — NORMAL SALINE FLUSH 0.9 % INJECTION SYRINGE: INTRAMUSCULAR | Qty: 10

## 2019-04-14 MED FILL — OXYCODONE-ACETAMINOPHEN 5 MG-325 MG TAB: 5-325 mg | ORAL | Qty: 1

## 2019-04-14 MED FILL — NICOTINE 14 MG/24 HR DAILY PATCH: 14 mg/24 hr | TRANSDERMAL | Qty: 1

## 2019-04-14 MED FILL — MORPHINE 2 MG/ML INJECTION: 2 mg/mL | INTRAMUSCULAR | Qty: 1

## 2019-04-14 MED FILL — ATORVASTATIN 40 MG TAB: 40 mg | ORAL | Qty: 1

## 2019-04-14 MED FILL — THERAPEUTIC MULTIVITAMIN TAB: ORAL | Qty: 1

## 2019-04-14 MED FILL — FOLIC ACID 1 MG TAB: 1 mg | ORAL | Qty: 1

## 2019-04-14 NOTE — Progress Notes (Signed)
NUTRITION  Reason for Rescreen: LOS        RECOMMENDATIONS:   Continue current diet  Interventions   - none at this time       Information obtained from:   Chart review. Sleeping at visit.   Past Medical History:   Diagnosis Date   ??? A-fib (HCC)    ??? Anxiety    ??? Arthritis    ??? [name] (coronary artery disease)    ??? Chronic obstructive pulmonary disease (HCC)    ??? Depression    ??? Diabetes (HCC)    ??? High cholesterol    ??? Hypertension    ??? MI (myocardial infarction) (HCC)    ??? Schizophrenia (HCC)      Pt admitted for chest pain. Awaiting placement for rehab with ETOH abuse noted. Homeless. Diabetes Team following for insulin adjustment with increase in NPH noted.     Diet:  consistent CHO  Supplements: None  Meal intake:   Patient Vitals for the past 168 hrs:   % Diet Eaten   04/13/19 1800 100 %   04/13/19 1230 100 %   04/13/19 0818 100 %   04/12/19 0904 100 %   04/09/19 1301 60 %     Weight Changes:   Unsure  Wt Readings from Last 10 Encounters:   04/09/19 116.9 kg (257 lb 11.2 oz)       Nutrition-Related Concerns Identified:  None    Estimated Nutrition Needs:   Kcals/day: 2106 Kcals/day(2106-2340kcal)  Protein: 117 g(1g/kg)  Fluid: 2300 ml(1ml/kcal)  Based On: Kcal/kg - specify (Comment)(18-20kcal/kg for gradual wt loss)  Weight Used: Actual wt(117kg)    PLAN:   - Continue current diet    Will revisit per policy    Arun Herrod E Ishmael Berkovich, RD CNSC, Pager #[phone] or via Perfect[employer]

## 2019-04-14 NOTE — Progress Notes (Signed)
[name]                                                                                          Hospitalist Progress Note  Derek RathkeMahmud Jacqeline Broers, MD  Answering service: [phone] OR [phone] from in house phone        Date of Service:  04/14/2019  NAME:  Derek PummelRobert Mclaughlin  DOB:  1951-07-29  MRN:  [id number]      Admission Summary:   Derek MaduroRobert Mclaughlin??is a [age] y.o.??male??who presents with PMH of DM, [name], HTN, [name], COPD here with c/o chest pain??x1 week and worsening??since last night??with associated??N/V and also having shortness of breath and cough.??He describes pain as??tight??heaviness to left side of chest??and radiates up to neck??and jaw. ??He is visiting from??[place]??and??states he was supposed to living with friends when he got here but the house was vacant when he arrived.??Has lived in motel until he ran out of money now homeless.??He has been out of medications??for about 1 week prior to coming to ED. He??states he has a history of alcohol dependence and??restarted drinking alcohol about 2 weeks??prior to coming to ED??and drinks 12 beers/day. He??states he has a hx of bipolar and schizophrenia and has had an increase in symptoms of depression over last few months, he stated??he doesn't care if he lives but denies suicidal and homicidal ideations.He??states he is??having??auditory and visual??hallucinations??x2 weeks where he hears his deceased wifes voice and thinks he has seen her in a room with him.??  Work up in ED: negative CXR, EKG, cardiology consult, ECHO ordered, labs reviewed, psych consult, r/o COVID. 04/07/2019  ??    Interval history / Subjective:   Doing OK no new issues , air mattress bothering him with back pain   His enteric bacterial panel is negative no new issues awaiting for placement   No overnight issues BS high will review insulin      Assessment & Plan:     Atypical Chest Pain:??resolved   - likely due to anxiety  -WGN:FAOZHYEKG:Normal sinus rhythm,??Inferior infarct, age undetermined,??RBBB   -troponin negative x 3 negative  -ECHO: EF 55 - 60%. No regional wall motion abnormality noted. Mild (grade 1) left ventricular diastolic dysfunction.  -cardiology on board  - cardiac cath 6/9: Normal LVEDP.   ??  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 Takes Xanax 2mg  tid last filled in May 20th 90 pills but patientstates he lost his medication while travelling here. Last dose was on Thursday 04/03/2019  - IV ativan prn   ??  Severe anxiety/ Panic attacks   C/w Hallucinations: hx of Bipolar and schizophrenia.??  - Auditory and visual hallucinations of deceased wife.??  -denies HI and SI, not on any mood medications except for Xanax  -seen by psyche , recommendation noted but pt does not want those medications as he thinks that makes him weak   - hydroxyzine prn   ??  DM type II with hyperglycemia  - SSI   - HGA1c 9  - started on NPH 15 U bid. C/w ISS  ??  [name]:   -lipid panel LDL  123  - started on statin  ????  HTN: BP elevated  - not on any meds per pharmacy  - started on lisinopril 20mg  daily    -prn hydralazine  ??  COPD not in exacerbation  -CXR negative  -guaifenesin as needed for cough, albuterol inhaler as needed  - COVID 19 negative ??  - recommend pulmonology and PFT eval as outpt   ??  ETOH Dependence:   - drinks 12 beers/day.  -CIWA protocol  ??  Tobacco Dependence: current smoker 2ppd-1/2ppd.  -not interested in quitting, counseled for cessation  -nicotine patch  ??  Fall 6/10  - CT head: No significant intracranial abnormalities  - CT neck: Avulsion fracture of the tip of the T1 spinous process, probably chronic  - NSGY consult placed   ??  Homelessness  -case management consult for resources    Code status: Full  DVT prophylaxis: SCD    Care Plan discussed with: Patient/Family and Nurse  Anticipated Disposition: Home w/Family  Anticipated Discharge: 24 hours to 48 hours     Hospital Problems  Date Reviewed: 04/07/2019          Codes Class Noted POA    Schizophrenia (HCC) ICD-10-CM: F20.9   ICD-9-CM: 295.90  04/09/2019 Yes        Bipolar 1 disorder, depressed, severe (HCC) ICD-10-CM: F31.4  ICD-9-CM: 296.53  04/09/2019 Yes        Tobacco abuse ICD-10-CM: Z72.0  ICD-9-CM: 305.1  04/09/2019 Yes        Alcohol abuse ICD-10-CM: F10.10  ICD-9-CM: 305.00  04/09/2019 Yes        Chest pain ICD-10-CM: R07.9  ICD-9-CM: 786.50  04/07/2019 Yes                Review of Systems:   A comprehensive review of systems was negative.       Vital Signs:    Last 24hrs VS reviewed since prior progress note. Most recent are:  Visit Vitals  BP 111/61 (BP 1 Location: Left arm, BP Patient Position: At rest)   Pulse 85   Temp 98.6 ??F (37 ??C)   Resp 16   Ht 5\' 10"  (1.778 m)   Wt 116.9 kg (257 lb 11.2 oz)   SpO2 95%   BMI 36.98 kg/m??       No intake or output data in the 24 hours ending 04/14/19 0906     Physical Examination:             Constitutional:  No acute distress, cooperative, pleasant    ENT:  Oral mucosa moist, oropharynx benign.    Resp:  CTA bilaterally. No wheezing/rhonchi/rales. No accessory muscle use   CV:  Regular rhythm, normal rate, no murmurs, gallops, rubs    GI:  Soft, non distended, non tender. normoactive bowel sounds, no hepatosplenomegaly     Musculoskeletal:  No edema, warm, 2+ pulses throughout    Neurologic:  Moves all extremities.  AAOx3, CN II-XII reviewed     Psych:  Good insight, positive anxious nor agitated.       Data Review:    I personally reviewed  Image and Lbs      Labs:     Recent Labs     04/12/19  0154   WBC 10.7   HGB 14.1   HCT 43.4   PLT 238     Recent Labs     04/12/19  0154   NA 135*   K 4.1   CL  105   CO2 24   BUN 26*   CREA 1.17   GLU 196*   CA 8.8   MG 1.8   PHOS 3.6     Recent Labs     04/12/19  0154   ALT 32   AP 74   TBILI 0.2   TP 6.6   ALB 2.8*   GLOB 3.8     Recent Labs     04/14/19  0341 04/13/19  0350 04/12/19  0154   INR 1.0 1.0 1.0   PTP 10.3 10.0 10.1   APTT 26.9 26.3 26.2      No results for input(s): FE, TIBC, PSAT, FERR in the last 72 hours.    No results found for: FOL, RBCF   No results for input(s): PH, PCO2, PO2 in the last 72 hours.  No results for input(s): CPK, CKNDX, TROIQ in the last 72 hours.    No lab exists for component: CPKMB  Lab Results   Component Value Date/Time    Cholesterol, total 201 (H) 04/08/2019 12:29 AM    HDL Cholesterol 36 04/08/2019 12:29 AM    LDL, calculated 123.2 (H) 57/84/696206/07/2019 12:29 AM    Triglyceride 209 (H) 04/08/2019 12:29 AM    CHOL/HDL Ratio 5.6 (H) 04/08/2019 12:29 AM     Lab Results   Component Value Date/Time    Glucose (POC) 427 (H) 04/14/2019 06:26 AM    Glucose (POC) 372 (H) 04/13/2019 09:24 PM    Glucose (POC) 162 (H) 04/13/2019 04:10 PM    Glucose (POC) 224 (H) 04/13/2019 11:08 AM    Glucose (POC) 182 (H) 04/13/2019 06:06 AM     Lab Results   Component Value Date/Time    Color YELLOW/STRAW 04/08/2019 02:07 AM    Appearance CLEAR 04/08/2019 02:07 AM    Specific gravity 1.019 04/08/2019 02:07 AM    pH (UA) 5.0 04/08/2019 02:07 AM    Protein 100 (A) 04/08/2019 02:07 AM    Glucose Negative 04/08/2019 02:07 AM    Ketone Negative 04/08/2019 02:07 AM    Bilirubin Negative 04/08/2019 02:07 AM    Urobilinogen 0.2 04/08/2019 02:07 AM    Nitrites Negative 04/08/2019 02:07 AM    Leukocyte Esterase Negative 04/08/2019 02:07 AM    Epithelial cells FEW 04/08/2019 02:07 AM    Bacteria Negative 04/08/2019 02:07 AM    WBC 0-4 04/08/2019 02:07 AM    RBC 50-100 04/08/2019 02:07 AM         Medications Reviewed:     Current Facility-Administered Medications   Medication Dose Route Frequency   ??? insulin NPH (NOVOLIN N, HUMULIN N) injection 10 Units  10 Units SubCUTAneous ACB/HS   ??? oxyCODONE-acetaminophen (PERCOCET) 5-325 mg per tablet 1 Tab  1 Tab Oral Q8H PRN   ??? thiamine HCL (B-1) tablet 100 mg  100 mg Oral DAILY   ??? folic acid (FOLVITE) tablet 1 mg  1 mg Oral DAILY   ??? therapeutic multivitamin (THERAGRAN) tablet 1 Tab  1 Tab Oral DAILY   ??? LORazepam (ATIVAN) injection 1 mg  1 mg IntraVENous Q4H PRN    ??? lisinopriL (PRINIVIL, ZESTRIL) tablet 20 mg  20 mg Oral DAILY   ??? sodium chloride (NS) flush 5-40 mL  5-40 mL IntraVENous Q8H   ??? sodium chloride (NS) flush 5-40 mL  5-40 mL IntraVENous PRN   ??? famotidine (PEPCID) tablet 20 mg  20 mg Oral BID   ??? morphine injection 0.5 mg  0.5 mg IntraVENous Q6H  PRN   ??? hydrOXYzine HCL (ATARAX) tablet 25 mg  25 mg Oral TID PRN   ??? atorvastatin (LIPITOR) tablet 40 mg  40 mg Oral QHS   ??? sodium chloride (NS) flush 5-40 mL  5-40 mL IntraVENous Q8H   ??? sodium chloride (NS) flush 5-40 mL  5-40 mL IntraVENous PRN   ??? heparin (porcine) injection 5,000 Units  5,000 Units SubCUTAneous Q8H   ??? prochlorperazine (COMPAZINE) tablet 10 mg  10 mg Oral Q6H PRN   ??? glucose chewable tablet 16 g  4 Tab Oral PRN   ??? glucagon (GLUCAGEN) injection 1 mg  1 mg IntraMUSCular PRN   ??? insulin lispro (HUMALOG) injection   SubCUTAneous AC&HS   ??? acetaminophen (TYLENOL) tablet 650 mg  650 mg Oral Q4H PRN   ??? hydrALAZINE (APRESOLINE) 20 mg/mL injection 20 mg  20 mg IntraVENous Q6H PRN   ??? guaiFENesin (ROBITUSSIN) 100 mg/5 mL oral liquid 100 mg  100 mg Oral Q4H PRN   ??? ondansetron (ZOFRAN) injection 4 mg  4 mg IntraVENous Q4H PRN   ??? nicotine (NICODERM CQ) 14 mg/24 hr patch 1 Patch  1 Patch TransDERmal DAILY   ??? albuterol (PROVENTIL HFA, VENTOLIN HFA, PROAIR HFA) inhaler 2 Puff  2 Puff Inhalation Q4H PRN   ??? aspirin chewable tablet 81 mg  81 mg Oral DAILY     ______________________________________________________________________  EXPECTED LENGTH OF STAY: 2d 14h  ACTUAL LENGTH OF STAY:          6                 [name], MD

## 2019-04-14 NOTE — Progress Notes (Signed)
Bedside and Verbal shift change report given to Alyssa, RN (oncoming nurse) by Mallory, RN (offgoing nurse). Report included the following information SBAR, Kardex, Procedure Summary, Intake/Output, MAR and Recent Results.

## 2019-04-14 NOTE — Other (Signed)
Bedside shift change report given to Mallory, RN (oncoming nurse) by Kaycee, RN (offgoing nurse). Report included the following information SBAR, Kardex, Intake/Output, MAR and Recent Results.

## 2019-04-14 NOTE — Progress Notes (Signed)
Physical Therapy  04/14/2019    Chart reviewed. Attempted to see pt this afternoon, however declining mobility d/t "having frequent BM's since given stool softeners for constipation and did not want to chance being too far from the bathroom". Pt began venting about experience leading up to hospitalization and became emotional, provided emotional support. Pt reported he did need to use bathroom at this time. Provided SBA as pt amb to bathroom. Instructed pt to call for NSG assist when ready to return to bed. PT to follow up tomorrow as able and appropriate.     Miranda Means, PTA

## 2019-04-14 NOTE — Other (Signed)
[facility]  CLINICAL NURSE SPECIALIST CONSULT  PROGRAM FOR DIABETES HEALTH    FOLLOW-UP NOTE    Presentation   Derek Mclaughlin is a 68 y.o. male with a PMH of DM, [name], HTN, [name] s/p stent placement, COPD, Afib, amxiety, MI, schizophrenia and alcohol abuse who presented to the ED with a c/o chest pain for a week with N/V/SOB and cough.  He is visiting from??[place]??and??states he was supposed to living with friends when he got here but the house was vacant when he arrived.??Has lived in [place] until he ran out of money now homeless.??He has been out of medications??for about 1 week prior to coming to ED. Also verbalized he was experiencing auditory and visual hallucinations the past two weeks.  In the ED he was admitted for a cardiac work-up.    Subjective   ???I am in much better spirits today.???  Case Management assisting with referrals to inpatient ETOH facilities after discharge  NPH adjusted to 15 units BID  12 units of correctional insulin given  Objective   Physical exam  General Alert, oriented and in no acute distress/ill-appearing. Conversant and cooperative.  Vital Signs   Visit Vitals  BP 111/61 (BP 1 Location: Left arm, BP Patient Position: At rest)   Pulse 85   Temp 98.6 ??F (37 ??C)   Resp 16   Ht '5\' 10"'$  (1.778 m)   Wt 116.9 kg (257 lb 11.2 oz)   SpO2 95%   BMI 36.98 kg/m??     Skin  Warm and dry  Heart   Regular rate and rhythm. No murmurs, rubs or gallops  Lungs  Clear to auscultation without rales or rhonchi  Extremities No foot wounds    Laboratory  Lab Results   Component Value Date/Time    Hemoglobin A1c 9.1 (H) 04/07/2019 04:49 PM     Lab Results   Component Value Date/Time    LDL, calculated 123.2 (H) 04/08/2019 12:29 AM     Lab Results   Component Value Date/Time    Creatinine 1.17 04/12/2019 01:54 AM     Lab Results   Component Value Date/Time    Sodium 135 (L) 04/12/2019 01:54 AM    Potassium 4.1 04/12/2019 01:54 AM    Chloride 105 04/12/2019 01:54 AM    CO2 24 04/12/2019 01:54 AM     Anion gap 6 04/12/2019 01:54 AM    Glucose 196 (H) 04/12/2019 01:54 AM    BUN 26 (H) 04/12/2019 01:54 AM    Creatinine 1.17 04/12/2019 01:54 AM    BUN/Creatinine ratio 22 (H) 04/12/2019 01:54 AM    GFR est AA >60 04/12/2019 01:54 AM    GFR est non-AA >60 04/12/2019 01:54 AM    Calcium 8.8 04/12/2019 01:54 AM    Bilirubin, total 0.2 04/12/2019 01:54 AM    Alk. phosphatase 74 04/12/2019 01:54 AM    Protein, total 6.6 04/12/2019 01:54 AM    Albumin 2.8 (L) 04/12/2019 01:54 AM    Globulin 3.8 04/12/2019 01:54 AM    A-G Ratio 0.7 (L) 04/12/2019 01:54 AM    ALT (SGPT) 32 04/12/2019 01:54 AM     Lab Results   Component Value Date/Time    ALT (SGPT) 32 04/12/2019 01:54 AM       Blood glucose pattern      Assessment and Plan   Nursing Diagnosis Risk for unstable blood glucose pattern   Nursing Intervention Domain 5250 Decision-making Support   Nursing Interventions Examined current inpatient diabetes control  Explored factors facilitating and impeding inpatient management     Evaluation   Derek Mclaughlin is a 68 y.o. male with a PMH of DM, [name], HTN, [name] s/p stent placement, COPD, Afib, amxiety, MI, schizophrenia and alcohol abuse who presented to the ED with a c/o chest pain for a week with N/V/SOB and cough.  He is visiting from??[place]??and??states he was supposed to living with friends when he got here but the house was vacant when he arrived.??Has lived in [place] until he ran out of money now homeless.??His medications and glucometer were lost during his transit to [place].  Unfortunately, due to financial limitations, his diet and food options will be limited on discharge.  At this time, his blood glucose is in goal of 100-180.     Recommendations   Recommend:  1. Agree with advancement to 15 units NPH BID  Please give additional 5 units NPH x1 now as he received 10 units NPH this morning.    2. Adjust correctional insulin to resistance scale ACHS    3.  Add moderate dose mealtime Humalog: 8 units Humalog with meals as  patient experiencing pre-prandial hyperglycemia.    Hold if patient consumes less than 50% of carbohydrates on meal tray      On Discharge:  Given his A1C was 9.1% and cost and resources are a supply concern.    -It would be recommended to resume his PTA metformin 1000 mg daily.    -Would add NPH 15 units BID  -Continue Lispro with meals with a dose adjustment: 10 units with meals  -Will need to establish a PCP- per case management recommendations   -Will need a new glucometer kit:  Check glucose twice daily (at breakfast and dinner)      Billing Code(s)     Thank you for including [place] in their care.  I spent 15 minutes in direct patient care today for this patient.  Time includes chart review, face to face with patient and collaboration with interdisciplinary care team.      Mittie Bodo, CNS  Access via [place] (C) [phone]

## 2019-04-14 NOTE — Progress Notes (Addendum)
TOC:    CM has called the following facilities to inquire about services and housing options for patient:    [facility]-[phone].  They only provide detox services and do not accept Medicare for 28 day program.    [facility] -[phone] message.    [facility] -[phone] They do not currently have room.    [facility]- Self Pay only, no medicare and no scholarships currently.    [facility]-[phone]- Patients must have a disposition plan for discharge from program.  Usually patient's are referred to a recovery home in [place], however they must be [place] residents to qualify.    [facility]-[phone]- Left message.    [facility] , [place]-[phone]-[place]aine-Currently an 8 week wait to get a room.  Elaine suggested calling [facility] in [place], [place], not associated, but might offer assistance.    [facility], [place] [phone]-[place]-Only take traditional Medicare.    CM received call from [facility], [place] [phone] following up on a referral sent be CM last week.  They needed SSN to run benefit info.  CM provided information.        Kristi Nelligan, RN/CRM

## 2019-04-14 NOTE — Other (Signed)
Pt requesting a new bed at this time. This nurse told pt that she would relay the message to the day shift nurse this AM.

## 2019-04-14 NOTE — Progress Notes (Signed)
Hospitalist paged due to pt's BS being 427. This nurse to give pt 10 units on insulin.

## 2019-04-14 NOTE — Progress Notes (Signed)
 Physical Therapy  04/14/2019    Chart reviewed. Attempted to see pt this afternoon, however declining mobility d/t having frequent BM's since given stool softeners for constipation and did not want to chance being too far from the bathroom. Pt began venting about experience leading up to hospitalization and became emotional, provided emotional support. Pt reported he did need to use bathroom at this time. Provided SBA as pt amb to bathroom. Instructed pt to call for NSG assist when ready to return to bed. PT to follow up tomorrow as able and appropriate.     Miranda Means, PTA

## 2019-04-14 NOTE — Progress Notes (Signed)
Hospitalist paged due to pt's BS being 427. This nurse to give pt 10 units on insulin.

## 2019-04-14 NOTE — Progress Notes (Signed)
 NUTRITION  Reason for Rescreen: LOS        RECOMMENDATIONS:   Continue current diet  Interventions   - none at this time       Information obtained from:   Chart review. Sleeping at visit.   Past Medical History:   Diagnosis Date   . A-fib (HCC)    . Anxiety    . Arthritis    . [name] (coronary artery disease)    . Chronic obstructive pulmonary disease (HCC)    . Depression    . Diabetes (HCC)    . High cholesterol    . Hypertension    . MI (myocardial infarction) (HCC)    . Schizophrenia (HCC)      Pt admitted for chest pain. Awaiting placement for rehab with ETOH abuse noted. Homeless. Diabetes Team following for insulin adjustment with increase in NPH noted.     Diet:  consistent CHO  Supplements: None  Meal intake:   Patient Vitals for the past 168 hrs:   % Diet Eaten   04/13/19 1800 100 %   04/13/19 1230 100 %   04/13/19 0818 100 %   04/12/19 0904 100 %   04/09/19 1301 60 %     Weight Changes:   Unsure  Wt Readings from Last 10 Encounters:   04/09/19 116.9 kg (257 lb 11.2 oz)       Nutrition-Related Concerns Identified:  None    Estimated Nutrition Needs:   Kcals/day: 2106 Kcals/day(2106-2340kcal)  Protein: 117 g(1g/kg)  Fluid: 2300 ml(32ml/kcal)  Based On: Kcal/kg - specify (Comment)(18-20kcal/kg for gradual wt loss)  Weight Used: Actual wt(117kg)    PLAN:   - Continue current diet    Will revisit per policy    Recardo FORBES Lowers, RD CNSC, Pager [phone] or via [employer]

## 2019-04-14 NOTE — Group Note (Signed)
Diabetes  Mgmt by Mittie Bodo, CNS at 04/14/19 1020                Author: Mittie Bodo, CNS  Service: Certified Clinical Nurse Specialist  Author Type: Clinical Nurse Specialist       Filed: 04/14/19 1025  Date of Service: 04/14/19 1020  Status: Signed          Editor: Mittie Bodo, CNS (Clinical Nurse Specialist)               [place]      FOLLOW-UP NOTE        Presentation     Derek Mclaughlin is a 68 y.o. male with a PMH of DM, [name], HTN, [name] s/p stent placement, COPD, Afib, amxiety, MI, schizophrenia and alcohol abuse who presented  to the ED with a c/o chest pain for a week with N/V/SOB and cough.  He is visiting from??[place]??and??states he was supposed to living with friends when he got here but the house was vacant when he arrived.??Has lived in [place] until he ran  out of money now homeless.??He has been out of medications??for about 1 week prior to coming to ED. Also verbalized he was experiencing auditory and visual hallucinations the past two weeks.  In the ED he was admitted for a cardiac work-up.        Subjective     I am in much better spirits today.   Case Management assisting with referrals to inpatient ETOH facilities after discharge   NPH adjusted to 15 units BID   12 units of correctional insulin given     Objective     Physical exam   General Alert, oriented and in no acute distress/ill-appearing. Conversant and cooperative.   Vital Signs    Visit Vitals      BP  111/61 (BP 1 Location: Left arm, BP Patient Position: At rest)     Pulse  85     Temp  98.6 ??F (37 ??C)     Resp  16     Ht  _0  (1.778 m)     Wt  116.9 kg (257 lb 11.2 oz)     SpO2  95%        BMI  36.98 kg/m??        Skin  Warm and dry   Heart   Regular rate and rhythm. No murmurs, rubs or gallops   Lungs  Clear to auscultation without rales or rhonchi   Extremities No foot wounds      Laboratory     Lab Results         Component  Value  Date/Time             Hemoglobin A1c  9.1 (H)  04/07/2019 04:49 PM          Lab Results         Component  Value  Date/Time            LDL, calculated  123.2 (H)  04/08/2019 12:29 AM          Lab Results         Component  Value  Date/Time            Creatinine  1.17  04/12/2019 01:54 AM          Lab Results         Component  Value  Date/Time            Sodium  135 (L)  04/12/2019 01:54 AM       Potassium  4.1  04/12/2019 01:54 AM       Chloride  105  04/12/2019 01:54 AM       CO2  24  04/12/2019 01:54 AM       Anion gap  6  04/12/2019 01:54 AM       Glucose  196 (H)  04/12/2019 01:54 AM       BUN  26 (H)  04/12/2019 01:54 AM       Creatinine  1.17  04/12/2019 01:54 AM       BUN/Creatinine ratio  22 (H)  04/12/2019 01:54 AM       GFR est AA  >60  04/12/2019 01:54 AM       GFR est non-AA  >60  04/12/2019 01:54 AM       Calcium  8.8  04/12/2019 01:54 AM       Bilirubin, total  0.2  04/12/2019 01:54 AM       Alk. phosphatase  74  04/12/2019 01:54 AM       Protein, total  6.6  04/12/2019 01:54 AM       Albumin  2.8 (L)  04/12/2019 01:54 AM       Globulin  3.8  04/12/2019 01:54 AM       A-G Ratio  0.7 (L)  04/12/2019 01:54 AM            ALT (SGPT)  32  04/12/2019 01:54 AM          Lab Results         Component  Value  Date/Time            ALT (SGPT)  32  04/12/2019 01:54 AM           Blood glucose pattern             Assessment and Plan        Nursing Diagnosis  Risk for unstable blood glucose pattern     Nursing Intervention Domain  5250 Decision-making Support        Nursing Interventions  Examined current inpatient diabetes control    Explored factors facilitating and impeding inpatient management          Evaluation     Derek Mclaughlin is a 68 y.o. male with a PMH of DM, [name], HTN, [name] s/p stent placement, COPD, Afib, amxiety, MI, schizophrenia and alcohol abuse who presented to the ED with a c/o  chest pain for a week with N/V/SOB and cough.  He is visiting from??[place]??and??states he was supposed to living with friends when he got  here but the house was vacant when he arrived.??Has lived in [place] until he ran out of money now homeless.??His  medications and glucometer were lost during his transit to [place].  Unfortunately, due to financial limitations, his diet and food options will be limited on discharge.  At this time, his blood glucose is in goal of 100-180.         Recommendations     Recommend:   1. Agree with advancement to 15 units NPH BID   Please give additional 5 units NPH x1 now as he received 10 units NPH this morning.      2. Adjust correctional insulin to resistance scale ACHS      3.  Add moderate dose mealtime Humalog: 8  units Humalog with meals as patient experiencing pre-prandial hyperglycemia.     Hold if patient consumes less than 50% of carbohydrates on meal tray         On Discharge:   Given his A1C was 9.1% and cost and resources are a supply concern.     -It would be recommended to resume his PTA metformin 1000 mg daily.     -Would add NPH 15 units BID   -Continue Lispro with meals with a dose adjustment: 10 units with meals   -Will need to establish a PCP- per case management recommendations    -Will need a new glucometer kit:  Check glucose twice daily (at breakfast and dinner)           Billing Code(s)        Thank you for including [place] in their care.  I spent 15 minutes in direct patient care today for this patient.  Time includes chart review, face to face with patient and collaboration with interdisciplinary care team.         Mittie Bodo, CNS   Access via [place] (C) [phone]

## 2019-04-14 NOTE — Progress Notes (Signed)
Progress Notes by Lucky RathkeUzzaman, Jazmine Heckman, MD at 04/14/19 [id number]                Author: Lucky RathkeUzzaman, Kailana Benninger, MD  Service: Internal Medicine  Author Type: Physician       Filed: 04/14/19 0907  Date of Service: 04/14/19 0906  Status: Signed          Editor: Lucky RathkeUzzaman, Gabrille Kilbride, MD (Physician)                    [name]                                                                                              Hospitalist Progress Note   Lucky RathkeMahmud Armaan Pond, MD   Answering service: [phone] OR [phone] from in house phone            Date of Service:  04/14/2019   NAME:  Tawni PummelRobert Gritz   DOB:  09/13/1951   MRN:  [id number]           Admission Summary:        Molly MaduroRobert Banning??is a 68 y.o.??male??who presents with PMH of DM, [name], HTN, [name], [name] ??He is visiting from??[place]??and??states he was supposed to living with friends when  he got here but the house was vacant when he arrived.??Has lived in motel until he ran out of money now homeless.??He has been out of medications??for about 1 week prior to coming to ED. He??states he has a history of alcohol dependence and??restarted  drinking alcohol about 2 weeks??prior to coming to ED??and drinks 12 beers/day. He??states he has a hx of bipolar and schizophrenia and has had an increase in symptoms of depression over last few months, he stated??he doesn't care if he lives  but denies suicidal and homicidal ideations.He??states he is??having??auditory and visual??hallucinations??x2 weeks where he hears his deceased wifes voice and thinks he has seen her in a room with him.??   Work up in ED: negative CXR, EKG, cardiology consult, ECHO ordered, labs reviewed, psych consult, r/o COVID. 04/07/2019   ??      Interval history / Subjective:        Doing OK no  new issues , air mattress bothering him with back pain    His enteric bacterial panel is negative no new issues awaiting for placement    No overnight issues BS high will review insulin           Assessment & Plan:          Atypical Chest Pain:??resolved    - likely due to anxiety   -WNU:UVOZDGEKG:Normal sinus rhythm,??Inferior infarct, age undetermined,??RBBB   -troponin negative x 3 negative   -ECHO: EF 55 - 60%. No regional wall motion abnormality noted. Mild (grade 1) left ventricular diastolic dysfunction.   -cardiology on board   - cardiac cath 6/9: Normal LVEDP.    ??  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 Takes Xanax 2mg  tid last filled in May 20th 90 pills but patientstates he lost his medication while travelling here. Last dose was on Thursday 04/03/2019   - IV ativan prn    ??   Severe anxiety/ Panic attacks    C/w Hallucinations: hx of Bipolar and schizophrenia.??   - Auditory and visual hallucinations of deceased wife.??   -denies HI and SI, not on any mood medications except for Xanax   -seen by psyche , recommendation noted but pt does not want those medications as he thinks that makes him weak    - hydroxyzine prn    ??   DM type II with hyperglycemia   - SSI    - HGA1c 9   - started on NPH 15 U bid. C/w ISS   ??   [name]:    -lipid panel LDL 123   - started on statin   ????   HTN: BP elevated   - not on any meds per pharmacy   - started on lisinopril 20mg  daily     -prn hydralazine   ??   COPD not in exacerbation   -CXR negative   -guaifenesin as needed for cough, albuterol inhaler as needed   - COVID 19 negative ??   - recommend pulmonology and PFT eval as outpt    ??   ETOH Dependence:    - drinks 12 beers/day.   -CIWA protocol   ??   Tobacco Dependence: current smoker 2ppd-1/2ppd.   -not interested in quitting, counseled for cessation   -nicotine patch   ??   Fall 6/10   - CT head: No significant intracranial abnormalities   - CT neck: Avulsion fracture of the tip of the T1 spinous process, probably  chronic   - NSGY consult placed    ??   Homelessness   -case management consult for resources      Code status: Full   DVT prophylaxis: SCD      Care Plan discussed with: Patient/Family and Nurse   Anticipated Disposition: Home w/Family   Anticipated Discharge: 24 hours to 48 hours           Hospital Problems   Date Reviewed:  04/07/2019                         Codes  Class  Noted  POA              Schizophrenia ([place])  ICD-10-CM: F20.9   ICD-9-CM: 295.90    04/09/2019  Yes                        Bipolar 1 disorder, depressed, severe ([place])  ICD-10-CM: F31.4   ICD-9-CM: 296.53    04/09/2019  Yes                        Tobacco abuse  ICD-10-CM: Z72.0   ICD-9-CM: 305.1    04/09/2019  Yes                        Alcohol abuse  ICD-10-CM: F10.10   ICD-9-CM: 305.00    04/09/2019  Yes                        Chest pain  ICD-10-CM: R07.9  ICD-9-CM: 786.50    04/07/2019  Yes                               Review of Systems:     A comprehensive review of systems was negative.            Vital Signs:      Last 24hrs VS reviewed since prior progress note. Most recent are:   Visit Vitals      BP  111/61 (BP 1 Location: Left arm, BP Patient Position: At rest)     Pulse  85     Temp  98.6 ??F (37 ??C)     Resp  16     Ht  5\' 10"  (1.778 m)     Wt  116.9 kg (257 lb 11.2 oz)     SpO2  95%        BMI  36.98 kg/m??           No intake or output data in the 24 hours ending 04/14/19 0906         Physical Examination:                     Constitutional:   No acute distress, cooperative, pleasant      ENT:   Oral mucosa moist, oropharynx benign.      Resp:   CTA bilaterally. No wheezing/rhonchi/rales. No accessory muscle use     CV:   Regular rhythm, normal rate, no murmurs, gallops, rubs         GI:   Soft, non distended, non tender. normoactive bowel sounds, no hepatosplenomegaly          Musculoskeletal:   No edema, warm, 2+ pulses throughout         Neurologic:   Moves all extremities.  AAOx3, CN II-XII reviewed       Psych:  Good insight, positive  anxious nor agitated.               Data Review:      I personally reviewed  Image and Lbs           Labs:          Recent Labs           04/12/19   0154     WBC  10.7     HGB  14.1     HCT  43.4        PLT  238          Recent Labs           04/12/19   0154     NA  135*     K  4.1     CL  105     CO2  24     BUN  26*     CREA  1.17     GLU  196*     CA  8.8     MG  1.8        PHOS  3.6          Recent Labs           04/12/19   0154     ALT  32     AP  74     TBILI  0.2     TP  6.6     ALB  2.8*  GLOB  3.8          Recent Labs             04/14/19   0341  04/13/19   0350  04/12/19   0154     INR  1.0  1.0  1.0     PTP  10.3  10.0  10.1          APTT  26.9  26.3  26.2         No results for input(s): FE, TIBC, PSAT, FERR in the last 72 hours.    No results found for: FOL, RBCF    No results for input(s): PH, PCO2, PO2 in the last 72 hours.   No results for input(s): CPK, CKNDX, TROIQ in the last 72 hours.      No lab exists for component: CPKMB     Lab Results         Component  Value  Date/Time            Cholesterol, total  201 (H)  04/08/2019 12:29 AM       HDL Cholesterol  36  04/08/2019 12:29 AM       LDL, calculated  123.2 (H)  04/08/2019 12:29 AM       Triglyceride  209 (H)  04/08/2019 12:29 AM            CHOL/HDL Ratio  5.6 (H)  04/08/2019 12:29 AM          Lab Results         Component  Value  Date/Time            Glucose (POC)  427 (H)  04/14/2019 06:26 AM       Glucose (POC)  372 (H)  04/13/2019 09:24 PM       Glucose (POC)  162 (H)  04/13/2019 04:10 PM       Glucose (POC)  224 (H)  04/13/2019 11:08 AM            Glucose (POC)  182 (H)  04/13/2019 06:06 AM          Lab Results         Component  Value  Date/Time            Color  YELLOW/STRAW  04/08/2019 02:07 AM       Appearance  CLEAR  04/08/2019 02:07 AM       Specific gravity  1.019  04/08/2019 02:07 AM       pH (UA)  5.0  04/08/2019 02:07 AM       Protein  100 (A)  04/08/2019 02:07 AM       Glucose  Negative  04/08/2019 02:07 AM       Ketone   Negative  04/08/2019 02:07 AM       Bilirubin  Negative  04/08/2019 02:07 AM       Urobilinogen  0.2  04/08/2019 02:07 AM       Nitrites  Negative  04/08/2019 02:07 AM       Leukocyte Esterase  Negative  04/08/2019 02:07 AM       Epithelial cells  FEW  04/08/2019 02:07 AM       Bacteria  Negative  04/08/2019 02:07 AM       WBC  0-4  04/08/2019 02:07 AM            RBC  50-100  04/08/2019 02:07 AM  Medications Reviewed:          Current Facility-Administered Medications          Medication  Dose  Route  Frequency           ?  insulin NPH (NOVOLIN N, HUMULIN N) injection 10 Units   10 Units  SubCUTAneous  ACB/HS     ?  oxyCODONE-acetaminophen (PERCOCET) 5-325 mg per tablet 1 Tab   1 Tab  Oral  Q8H PRN     ?  thiamine HCL (B-1) tablet 100 mg   100 mg  Oral  DAILY     ?  folic acid (FOLVITE) tablet 1 mg   1 mg  Oral  DAILY     ?  therapeutic multivitamin (THERAGRAN) tablet 1 Tab   1 Tab  Oral  DAILY     ?  LORazepam (ATIVAN) injection 1 mg   1 mg  IntraVENous  Q4H PRN     ?  lisinopriL (PRINIVIL, ZESTRIL) tablet 20 mg   20 mg  Oral  DAILY     ?  sodium chloride (NS) flush 5-40 mL   5-40 mL  IntraVENous  Q8H     ?  sodium chloride (NS) flush 5-40 mL   5-40 mL  IntraVENous  PRN     ?  famotidine (PEPCID) tablet 20 mg   20 mg  Oral  BID     ?  morphine injection 0.5 mg   0.5 mg  IntraVENous  Q6H PRN     ?  hydrOXYzine HCL (ATARAX) tablet 25 mg   25 mg  Oral  TID PRN     ?  atorvastatin (LIPITOR) tablet 40 mg   40 mg  Oral  QHS     ?  sodium chloride (NS) flush 5-40 mL   5-40 mL  IntraVENous  Q8H     ?  sodium chloride (NS) flush 5-40 mL   5-40 mL  IntraVENous  PRN     ?  heparin (porcine) injection 5,000 Units   5,000 Units  SubCUTAneous  Q8H     ?  prochlorperazine (COMPAZINE) tablet 10 mg   10 mg  Oral  Q6H PRN     ?  glucose chewable tablet 16 g   4 Tab  Oral  PRN           ?  glucagon (GLUCAGEN) injection 1 mg   1 mg  IntraMUSCular  PRN           ?  insulin lispro (HUMALOG) injection     SubCUTAneous   AC&HS     ?  acetaminophen (TYLENOL) tablet 650 mg   650 mg  Oral  Q4H PRN     ?  hydrALAZINE (APRESOLINE) 20 mg/mL injection 20 mg   20 mg  IntraVENous  Q6H PRN     ?  guaiFENesin (ROBITUSSIN) 100 mg/5 mL oral liquid 100 mg   100 mg  Oral  Q4H PRN     ?  ondansetron (ZOFRAN) injection 4 mg   4 mg  IntraVENous  Q4H PRN     ?  nicotine (NICODERM CQ) 14 mg/24 hr patch 1 Patch   1 Patch  TransDERmal  DAILY     ?  albuterol (PROVENTIL HFA, VENTOLIN HFA, PROAIR HFA) inhaler 2 Puff   2 Puff  Inhalation  Q4H PRN           ?  aspirin chewable tablet 81 mg  81 mg  Oral  DAILY        ______________________________________________________________________   EXPECTED LENGTH OF STAY: 2d 14h   ACTUAL LENGTH OF STAY:          6                    Lucky RathkeMahmud Guido Comp, MD

## 2019-04-14 NOTE — Progress Notes (Signed)
TOC:    CM has called the following facilities to inquire about services and housing options for patient:    [phone].  They only provide detox services and do not accept Medicare for 28 day program.    [facility] [phone] message.    [facility] [phone] They do not currently have room.    [facility]- Self Pay only, no medicare and no scholarships currently.    [facility] [phone]- Patients must have a disposition plan for discharge from program.  Usually patient's are referred to a recovery home in [place], however they must be [place] residents to qualify.    [facility] [phone]- Left message.    [facility] [employer] [phone]-Currently an 8 week wait to get a room.  Consuella Lose suggested [facility] in [place], [place], not associated, but might offer assistance.    [address], [place] [phone] take traditional Medicare.    CM received call from [facility], [place] [phone] following up on a referral sent be CM last week.  They needed SSN to run benefit info.  CM provided information.        Cheree Ditto, RN/CRM

## 2019-04-15 LAB — POCT GLUCOSE
POC Glucose: 204 mg/dL — ABNORMAL HIGH (ref 65–100)
POC Glucose: 146 mg/dL — ABNORMAL HIGH (ref 65–100)
POC Glucose: 158 mg/dL — ABNORMAL HIGH (ref 65–100)
POC Glucose: 173 mg/dL — ABNORMAL HIGH (ref 65–100)

## 2019-04-15 LAB — FIBRINOGEN
Fibrinogen: 602 mg/dL — ABNORMAL HIGH (ref 200–475)
Fibrinogen: 602 mg/dL — ABNORMAL HIGH (ref 200–475)

## 2019-04-15 LAB — PROTIME-INR
INR: 1 (ref 0.9–1.1)
Protime: 10.1 s (ref 9.0–11.1)

## 2019-04-15 LAB — APTT: aPTT: 26 s (ref 22.1–32.0)

## 2019-04-15 LAB — D-DIMER, QUANTITATIVE: D-Dimer, Quant: 0.53 mg/L FEU (ref 0.00–0.65)

## 2019-04-15 LAB — GLUCOSE, POC
Glucose (POC): 204 mg/dL — ABNORMAL HIGH (ref 65–100)
Glucose (POC): 146 mg/dL — ABNORMAL HIGH (ref 65–100)
Glucose (POC): 158 mg/dL — ABNORMAL HIGH (ref 65–100)
Glucose (POC): 173 mg/dL — ABNORMAL HIGH (ref 65–100)

## 2019-04-15 LAB — PROTHROMBIN TIME + INR
INR: 1 (ref 0.9–1.1)
Prothrombin time: 10.1 s (ref 9.0–11.1)

## 2019-04-15 LAB — PTT: aPTT: 26 s (ref 22.1–32.0)

## 2019-04-15 LAB — D DIMER: D-dimer: 0.53 mg/L FEU (ref 0.00–0.65)

## 2019-04-15 MED ORDER — LORAZEPAM 0.5 MG TAB
0.5 mg | Freq: Four times a day (QID) | ORAL | Status: DC | PRN
Start: 2019-04-15 — End: 2019-04-17
  Administered 2019-04-15 – 2019-04-17 (×7): via ORAL

## 2019-04-15 MED FILL — HEPARIN (PORCINE) 5,000 UNIT/ML IJ SOLN: 5000 unit/mL | INTRAMUSCULAR | Qty: 1

## 2019-04-15 MED FILL — FOLIC ACID 1 MG TAB: 1 mg | ORAL | Qty: 1

## 2019-04-15 MED FILL — MORPHINE 2 MG/ML INJECTION: 2 mg/mL | INTRAMUSCULAR | Qty: 1

## 2019-04-15 MED FILL — OXYCODONE-ACETAMINOPHEN 5 MG-325 MG TAB: 5-325 mg | ORAL | Qty: 1

## 2019-04-15 MED FILL — CHILDREN'S ASPIRIN 81 MG CHEWABLE TABLET: 81 mg | ORAL | Qty: 1

## 2019-04-15 MED FILL — INSULIN NPH HUMAN RECOMB 100 UNIT/ML INJECTION: 100 unit/mL | SUBCUTANEOUS | Qty: 1

## 2019-04-15 MED FILL — LORAZEPAM 2 MG/ML IJ SOLN: 2 mg/mL | INTRAMUSCULAR | Qty: 1

## 2019-04-15 MED FILL — NORMAL SALINE FLUSH 0.9 % INJECTION SYRINGE: INTRAMUSCULAR | Qty: 10

## 2019-04-15 MED FILL — NICOTINE 14 MG/24 HR DAILY PATCH: 14 mg/24 hr | TRANSDERMAL | Qty: 1

## 2019-04-15 MED FILL — ACETAMINOPHEN 325 MG TABLET: 325 mg | ORAL | Qty: 2

## 2019-04-15 MED FILL — THERAPEUTIC MULTIVITAMIN TAB: ORAL | Qty: 1

## 2019-04-15 MED FILL — INSULIN LISPRO 100 UNIT/ML INJECTION: 100 unit/mL | SUBCUTANEOUS | Qty: 1

## 2019-04-15 MED FILL — FAMOTIDINE 20 MG TAB: 20 mg | ORAL | Qty: 1

## 2019-04-15 MED FILL — LORAZEPAM 0.5 MG TAB: 0.5 mg | ORAL | Qty: 1

## 2019-04-15 MED FILL — THIAMINE HCL 100 MG TAB: 100 mg | ORAL | Qty: 1

## 2019-04-15 MED FILL — ATORVASTATIN 40 MG TAB: 40 mg | ORAL | Qty: 1

## 2019-04-15 MED FILL — LISINOPRIL 20 MG TAB: 20 mg | ORAL | Qty: 1

## 2019-04-15 MED FILL — SENEXON-S 8.6 MG-50 MG TABLET: ORAL | Qty: 1

## 2019-04-15 NOTE — Other (Signed)
Bedside and Verbal shift change report given to Morgan, RN (oncoming nurse) by Jenny, RN (offgoing nurse). Report included the following information SBAR, Kardex, Intake/Output, MAR and Recent Results.

## 2019-04-15 NOTE — Progress Notes (Signed)
Bedside and Verbal shift change report given to Malik RN (oncoming nurse) by Alyssa RN (offgoing nurse). Report included the following information SBAR and Kardex.

## 2019-04-15 NOTE — Other (Signed)
Utilization Reviews     ??   Substance-Related Disorders - Care Day 5 (04/14/2019) by Tyna Jaksch, RN     ??   Review Entered Review Status   04/14/2019 15:54 Completed   ??   Criteria Review      Care Day: 5 Care Date: 04/14/2019 Level of Care: Telemetry    Guideline Day 3    Clinical Status    (X) * No seizure or other severe withdrawal event for at least 24 hours    04/14/2019 15:54:35 EDT by Tyna Jaksch    ?? NONE.    (X) * Thoughts of suicide or Harm absent or manageable at lower level of care    04/14/2019 15:54:35 EDT by Tyna Jaksch    ?? NONE.    ( ) * Patient and supports understand follow-up treatment and crisis plan    ( ) * Provider and supports sufficiently available at lower level of care    ( ) * Patient can participate (eg, verify absence of plan for harm) in needed monitoring    (X) * Functional status impairment absent or adequate self-care support planned at lower level of care    04/14/2019 15:54:35 EDT by Tyna Jaksch    ?? PT AND OT CONSULT.    (X) * Medical comorbidities, adverse medication events, and substance withdrawal manifestations absent or treatable at lower level of care    04/14/2019 15:54:36 EDT by Tyna Jaksch    ?? His enteric bacterial panel is negative.    (X) Complete discharge planning    04/14/2019 15:54:36 EDT by Tyna Jaksch    ?? AWAITING PLACEMENT. HOMELESS.    Medications    (X) Thiamine and multivitamin supplement for alcohol use disorder or dependence    04/14/2019 15:54:36 EDT by Tyna Jaksch    ?? FOLVITE '1MG'$  PO QD.  MVI ONE PO QD.  THIAMINE '100MG'$  PO QD.    * Milestone   Additional Notes   04/14/19         98.8 P 83 RR 16 BP 121/79 SPO2 96% ROOM AIR.      INR: 1.0   Prothrombin time: 10.3   aPTT: 26.9   Fibrinogen: 510    D-dimer: 0.46      FULL CODE.   CARDIAC REGULAR DIET.   DILAY DDIMER/FIBRINOGEN/PT/INR/PTT.   AMBULATE WITH ASSIST.   OT AND PT CONSULTS.   GLUCOSE MANAGEMENT PER PROTOCOL.       ASA '81MG'$  PO QD.   LIPITOR '40MG'$  PO QHS.   PEPCID '20MG'$  PO BID.   ZESTRIL '20MG'$  PO QD.    PERCOCET 5-'325MG'$  PO Q8H PRN.   ATIVAN '1MG'$  IV Q4H PRN.   MORPHINE 0.'5MG'$  IV Q6H PRN.   NPH 15 U SC BID AC.   [place].   HEPARIN 5000 U SC Q8H.   LISPRO SSI SC QACHS.         INTERNAL MED>   Assessment & Plan:   ??   Atypical Chest Pain:??resolved    - likely due to anxiety   -BSJ:GGEZMO sinus rhythm,??Inferior infarct, age undetermined,??RBBB   -troponin negative x 3 negative   -ECHO: EF 55 - 60%. No regional wall motion abnormality noted. Mild (grade 1) left ventricular diastolic dysfunction.   -cardiology on board   - cardiac cath 6/9: Normal LVEDP.??   ??   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 Takes Xanax '2mg'$  tid last filled in May 20th 90 pills but patientstates  he lost his medication while travelling here. Last dose was on Thursday 04/03/2019   - IV ativan prn??   ??   Severe anxiety/ Panic attacks??   C/w Hallucinations: hx of Bipolar and schizophrenia.??   - Auditory and visual hallucinations of deceased wife.??   -denies HI and SI, not on any mood medications except for Xanax   -seen by psyche , recommendation noted but pt does not want those medications as he thinks that makes him weak    - hydroxyzine prn??   ??   DM type II with hyperglycemia   - SSI    - HGA1c 9   - started on NPH??15 U bid. C/w ISS   ??   [name]:    -lipid panel LDL 123   -??started on??statin   ????   HTN:??BP elevated   -??not on any meds per pharmacy   - started on lisinopril '20mg'$  daily????   -prn hydralazine   ??   COPD not in exacerbation   -CXR negative   -guaifenesin as needed for cough,??albuterol inhaler as needed   - COVID 19 negative ??   - recommend pulmonology and PFT eval as outpt??   ??   ETOH Dependence:    - drinks 12 beers/day.   -CIWA protocol   ??   Tobacco Dependence: current smoker 2ppd-1/2ppd.   -not interested in quitting, counseled for cessation   -nicotine patch   ??   Fall 6/10   - CT head:??No significant intracranial abnormalities   - CT neck:??Avulsion fracture of the tip of the T1 spinous process,  probably chronic   - NSGY consult placed??   ??   Homelessness   -case management consult for resources         ??   Substance-Related Disorders - Care Day 4 (04/13/2019) by Tyna Jaksch, RN     ??   Review Entered Review Status   04/14/2019 12:57 Completed   ??   Criteria Review      Care Day: 4 Care Date: 04/13/2019 Level of Care: Telemetry    Guideline Day 3    Clinical Status    (X) * No seizure or other severe withdrawal event for at least 24 hours    04/14/2019 12:57:17 EDT by Tyna Jaksch    ?? NONE.    (X) * Thoughts of suicide or Harm absent or manageable at lower level of care    04/14/2019 12:57:17 EDT by Tyna Jaksch    ?? NONE.    ( ) * Patient and supports understand follow-up treatment and crisis plan    ( ) * Provider and supports sufficiently available at lower level of care    ( ) * Patient can participate (eg, verify absence of plan for harm) in needed monitoring    (X) * Functional status impairment absent or adequate self-care support planned at lower level of care    04/14/2019 12:57:17 EDT by Tyna Jaksch    ?? OT AND PT CONSULTS.    (X) * Medical comorbidities, adverse medication events, and substance withdrawal manifestations absent or treatable at lower level of care    04/14/2019 12:57:17 EDT by Tyna Jaksch    ?? His enteric bacterial panel is negative no new issues awaiting for placement    (X) Complete discharge planning    04/14/2019 12:57:17 EDT by Tyna Jaksch    ?? AWAITING PLACEMENT AS PATIENT HOMELESS.    Medications    (X) Thiamine and multivitamin supplement for  alcohol use disorder or dependence    04/14/2019 12:57:17 EDT by Tyna Jaksch    ?? FOLVITE '1MG'$  PO QD.  MVI ONE PO QD.  THIAMINE '100MG'$  PO QD.    * Milestone   Additional Notes   04/13/19         FULL CODE.   CARDIAC REGULAR DIET.   DILAY DDIMER/FIBRINOGEN/PT/INR/PTT.   AMBULATE WITH ASSIST.   OT AND PT CONSULTS.   GLUCOSE MANAGEMENT PER PROTOCOL.       ASA '81MG'$  PO QD.   LIPITOR '40MG'$  PO QHS.   PEPCID '20MG'$  PO BID.   ZESTRIL '20MG'$  PO QD.    PERCOCET 5-'325MG'$  PO Q8H PRN.   ATIVAN '1MG'$  IV Q4H PRN (5 DOSES).   MORPHINE 0.'5MG'$  IV Q6H PRN (4 DOSES).      [place].   HEPARIN 5000 U SC Q8H.   LISPRO SSI SC QACHS.         Assessment & Plan:   ??   Atypical Chest Pain:??resolved    - likely due to anxiety   -OIN:OMVEHM sinus rhythm,??Inferior infarct, age undetermined,??RBBB   -troponin negative x 3 negative   -ECHO: EF 55 - 60%. No regional wall motion abnormality noted. Mild (grade 1) left ventricular diastolic dysfunction.   -cardiology on board   - cardiac cath 6/9: Normal LVEDP.??   ??   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 Takes Xanax '2mg'$  tid last filled in May 20th 90 pills but patientstates he lost his medication while travelling here. Last dose was on Thursday 04/03/2019   - IV ativan prn??   ??   Severe anxiety/ Panic attacks??   C/w Hallucinations: hx of Bipolar and schizophrenia.??   - Auditory and visual hallucinations of deceased wife.??   -denies HI and SI, not on any mood medications except for Xanax   -seen by psyche , recommendation noted but pt does not want those medications as he thinks that makes him weak    - hydroxyzine prn??   ??   DM type II with hyperglycemia   - SSI    - HGA1c 9   - started on NPH??10 U bid. C/w ISS   ??   [name]:    -lipid panel LDL 123   -??started on??statin   ????   HTN:??BP elevated   -??not on any meds per pharmacy   - started on lisinopril '20mg'$  daily????   -prn hydralazine   ??   COPD not in exacerbation   -CXR negative   -guaifenesin as needed for cough,??albuterol inhaler as needed   - COVID 19 negative ??   - recommend pulmonology and PFT eval as outpt??   ??   ETOH Dependence:    - drinks 12 beers/day.   -CIWA protocol   ??   Tobacco Dependence: current smoker 2ppd-1/2ppd.   -not interested in quitting, counseled for cessation   -nicotine patch   ??   Fall 6/10   - CT head:??No significant intracranial abnormalities   - CT neck:??Avulsion fracture of the tip of the T1 spinous process, probably chronic    - NSGY consult placed??   ??   Homelessness   -case management consult for resources         BP 121/65 (BP 1 Location: Left arm, BP Patient Position: At rest)   Pulse 69   Temp 98.2 ??F (36.8 ??C)   Resp 18   Ht '5\' 10"'$  (1.778 m)   Wt 116.9  kg (257 lb 11.2 oz)   SpO2 95%   BMI 36.98 kg/m??   ??   ??   Physical Examination:   ?? ?? ?? ?? ?? ?? ?? ?? ?? ?? ?? ?? ?? ?? ?? ?? ?? ?? ?? ?? ??    Constitutional: No acute distress, cooperative, pleasant??   ENT: Oral mucosa moist, oropharynx benign.    Resp: CTA bilaterally. No wheezing/rhonchi/rales. No accessory muscle use   CV: Regular rhythm, normal rate, no murmurs, gallops, rubs   ??GI: Soft, non distended, non tender. normoactive bowel sounds, no hepatosplenomegaly    ??Musculoskeletal: No edema, warm, 2+ pulses throughout   ??Neurologic: Moves all extremities. ??AAOx3, CN II-XII reviewed   ???? ?? ?? ?? ?? ?? ?? ?? ?? ?? ??Psych: ??Good insight, positive anxious nor agitated.      ??   Substance-Related Disorders - Care Day 3 (04/12/2019) by Tyna Jaksch, RN     ??   Review Entered Review Status   04/14/2019 12:53 Completed   ??   Criteria Review      Care Day: 3 Care Date: 04/12/2019 Level of Care: Telemetry    Guideline Day 3    Clinical Status    (X) * No seizure or other severe withdrawal event for at least 24 hours    04/14/2019 12:53:44 EDT by Tyna Jaksch    ?? NONE.    (X) * Thoughts of suicide or Harm absent or manageable at lower level of care    04/14/2019 12:53:44 EDT by Tyna Jaksch    ?? NONE.    ( ) * Patient and supports understand follow-up treatment and crisis plan    ( ) * Provider and supports sufficiently available at lower level of care    04/14/2019 12:53:44 EDT by Tyna Jaksch    ?? [place].    ( ) * Patient can participate (eg, verify absence of plan for harm) in needed monitoring    (X) * Functional status impairment absent or adequate self-care support planned at lower level of care    04/14/2019 12:53:44 EDT by Tyna Jaksch    ?? OT AND PT CONSULTS.     (X) * Medical comorbidities, adverse medication events, and substance withdrawal manifestations absent or treatable at lower level of care    04/14/2019 12:53:44 EDT by Tyna Jaksch    ?? His enteric bacterial panel is negative no new issues awaiting for placement.    (X) Complete discharge planning    04/14/2019 12:53:44 EDT by Tyna Jaksch    ?? Homelessness  -case management consult for resources    Medications    (X) Thiamine and multivitamin supplement for alcohol use disorder or dependence    04/14/2019 12:53:44 EDT by Tyna Jaksch    ?? FOLVITE '1MG'$  PO QD.  MVI ONE PO QD.  THIAMINE '100MG'$  PO QD.    * Milestone   Additional Notes   04/12/19         WBC: 10.7   NRBC: 0.0   RBC: 4.88   HGB: 14.1   HCT: 43.4   MCV: 88.9   MCH: 28.9   MCHC: 32.5   RDW: 13.6   PLATELET: 238   MPV: 11.5   NEUTROPHILS: 62   LYMPHOCYTES: 24   MONOCYTES: 9   EOSINOPHILS: 4   BASOPHILS: 0   IMMATURE GRANULOCYTES: 1    ABS. NEUTROPHILS: 6.6   ABS. IMM. GRANS.: 0.1    ABS. LYMPHOCYTES: 2.6   ABS.  MONOCYTES: 1.0   ABS. EOSINOPHILS: 0.4   ABS. BASOPHILS: 0.0   INR: 1.0   Prothrombin time: 10.1   aPTT: 26.2   Fibrinogen: 494    D-dimer: 0.43   Sodium: 135    Potassium: 4.1   Chloride: 105   CO2: 24   Anion gap: 6   Glucose: 196    BUN: 26    Creatinine: 1.17   BUN/Creatinine ratio: 22    Calcium: 8.8   Phosphorus: 3.6   Magnesium: 1.8   GFR est non-AA: >60   GFR est AA: >60   Bilirubin, total: 0.2   Protein, total: 6.6   Albumin: 2.8    Globulin: 3.8   A-G Ratio: 0.7    ALT: 32   AST: 21   Alk. phosphatase: 74            FULL CODE.   CARDIAC REGULAR DIET.   DILAY DDIMER/FIBRINOGEN/PT/INR/PTT.   AMBULATE WITH ASSIST.   OT AND PT CONSULTS.   GLUCOSE MANAGEMENT PER PROTOCOL.       ASA '81MG'$  PO QD.   LIPITOR '40MG'$  PO QHS.   PEPCID '20MG'$  PO BID.   ZESTRIL '20MG'$  PO QD.   PERCOCET 5-'325MG'$  PO Q8H PRN.   ATIVAN '1MG'$  IV Q4H PRN (5 DOSES).   MORPHINE 0.'5MG'$  IV Q6H PRN (4 DOSES).      [place].   HEPARIN 5000 U SC Q8H.   LISPRO SSI SC QACHS.         IM>    Assessment & Plan:   ??   Atypical Chest Pain:??resolved    - likely due to anxiety   -ZPH:XTAVWP sinus rhythm,??Inferior infarct, age undetermined,??RBBB   -troponin negative x 3 negative   -ECHO: EF 55 - 60%. No regional wall motion abnormality noted. Mild (grade 1) left ventricular diastolic dysfunction.   -cardiology on board   - cardiac cath 6/9: Normal LVEDP.??   ??   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 Takes Xanax '2mg'$  tid last filled in May 20th 90 pills but patientstates he lost his medication while travelling here. Last dose was on Thursday 04/03/2019   - IV ativan prn??   ??   Severe anxiety/ Panic attacks??   C/w Hallucinations: hx of Bipolar and schizophrenia.??   - Auditory and visual hallucinations of deceased wife.??   -denies HI and SI, not on any mood medications except for Xanax   -seen by psyche , recommendation noted but pt does not want those medications as he thinks that makes him weak    - hydroxyzine prn??   ??   DM type II with hyperglycemia   - SSI    - HGA1c 9   - started on NPH??10 U bid. C/w ISS   ??   [name]:    -lipid panel LDL 123   -??started on??statin   ????   HTN:??BP elevated   -??not on any meds per pharmacy   - started on lisinopril '20mg'$  daily????   -prn hydralazine   ??   COPD not in exacerbation   -CXR negative   -guaifenesin as needed for cough,??albuterol inhaler as needed   - COVID 19 negative ??   - recommend pulmonology and PFT eval as outpt??   ??   ETOH Dependence:    - drinks 12 beers/day.   -CIWA protocol   ??   Tobacco Dependence: current smoker 2ppd-1/2ppd.   -not interested in quitting, counseled for cessation   -  nicotine patch   ??   Fall 6/10   - CT head:??No significant intracranial abnormalities   - CT neck:??Avulsion fracture of the tip of the T1 spinous process, probably chronic   - NSGY consult placed??   ??   Homelessness   -case management consult for resources         ??   Substance-Related Disorders - Care Day 2 (04/11/2019) by Tyna Jaksch, RN     ??    Review Entered Review Status   04/11/2019 13:54 Completed   ??   Criteria Review      Care Day: 2 Care Date: 04/11/2019 Level of Care: Telemetry    Guideline Day 2    Clinical Status    (X) * No dangerous behavior [U] for at least 24 hours    04/11/2019 13:54:39 EDT by Tyna Jaksch    ?? NONE.    (X) * No Current plan for serious Harm for at least 24 hours    04/11/2019 13:54:39 EDT by Tyna Jaksch    ?? NONE.    Interventions    (X) Oral hydration, medications    04/11/2019 13:54:39 EDT by Tyna Jaksch    ?? ASA '81MG'$  PO QD.  LIPITOR '40MG'$  PO QHS.  PEPCID '20MG'$  PO BID.  ZESTRIL '20MG'$  PO QD.  PERCOCET 5-'325MG'$  PO Q8H PRN.  CARDIAC REGULAR DIET.    (X) No behavioral crisis management for at least 24 hours    04/11/2019 13:54:39 EDT by Tyna Jaksch    ?? NONE.    ( ) No need for continual observation or frequent safety checks    04/11/2019 13:54:39 EDT by Tyna Jaksch    ?? SEIZURE PRECAUTIONS.  CIWA MONITORING.  CONTINUOUS VS.    Medications    (X) Medication review, adjustment    04/11/2019 13:54:39 EDT by Tyna Jaksch    ?? ATIVAN '1MG'$  Q4H PRN.  MORPHINE 0.'5MG'$  IV Q6H PRN.    (X) Thiamine and multivitamin supplement for alcohol use disorder or dependence    04/11/2019 13:54:39 EDT by Tyna Jaksch    ?? FOLVITE '1MG'$  PO QD.  MVI ONE PO QD.  THIAMINE '100MG'$  PO QD.    Evaluation    (X) * Substance use, medical, and social assessments completed and reviewed    04/11/2019 13:54:39 EDT by Tyna Jaksch    ?? DONE PER PSYCH AND ATTENDING.    (X) Psychiatric assessment completed and reviewed    04/11/2019 13:54:39 EDT by Tyna Jaksch    ?? COMPLETE.    Therapy    (X) Psychosocial interventions emphasizing barriers to discharge [T]    04/11/2019 13:54:39 EDT by Tyna Jaksch    ?? CM CONSULT AS PATIENT HOMELESS.    * Milestone   Additional Notes   04/11/19         98.7 P 72 RR 18 BP 134/78. SPO2 95% ROOM AIR.       INR: 1.0   Prothrombin time: 10.4   aPTT: 27.3   Fibrinogen: 548    D-dimer: 0.44      POC GLUCOSE 241-233.      FULL CODE.   CARDIAC REGULAR DIET.    DILAY DDIMER/FIBRINOGEN/PT/INR/PTT.   AMBULATE WITH ASSIST.   OT AND PT CONSULTS.   GLUCOSE MANAGEMENT PER PROTOCOL.          [place].   HEPARIN 5000 U SC Q8H.   LISPRO SSI SC QACHS.   NPH 10 U SC BID AC.  INTERNAL MEDICINE>   Interval history / Subjective:   Pt is emotionally labile, he had diarrhea    His enteric bacterial panel is negative no new issues awaiting for placement    ??   Assessment & Plan:   ??   Atypical Chest Pain:??resolved    - likely due to anxiety   -RUE:AVWUJW sinus rhythm,??Inferior infarct, age undetermined,??RBBB   -troponin negative x 3 negative   -ECHO: EF 55 - 60%. No regional wall motion abnormality noted. Mild (grade 1) left ventricular diastolic dysfunction.   -cardiology on board   - cardiac cath 6/9: Normal LVEDP.??   ??   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 Takes Xanax '2mg'$  tid last filled in May 20th 90 pills but patientstates he lost his medication while travelling here. Last dose was on Thursday 04/03/2019   - IV ativan prn??   ??   Severe anxiety/ Panic attacks??   C/w Hallucinations: hx of Bipolar and schizophrenia.??   - Auditory and visual hallucinations of deceased wife.??   -denies HI and SI, not on any mood medications except for Xanax   -seen by psyche , recommendation noted but pt does not want those medications as he thinks that makes him weak    - hydroxyzine prn??   ??   DM type II with hyperglycemia   - SSI    - HGA1c 9   - started on NPH??10 U bid. C/w ISS   ??   [name]:    -lipid panel LDL 123   -??started on??statin   ????   HTN:??BP elevated   -??not on any meds per pharmacy   - started on lisinopril '20mg'$  daily????   -prn hydralazine   ??   COPD not in exacerbation   -CXR negative   -guaifenesin as needed for cough,??albuterol inhaler as needed   - COVID 19 negative ??   - recommend pulmonology and PFT eval as outpt??   ??   ETOH Dependence:    - drinks 12 beers/day.   -CIWA protocol   ??   Tobacco Dependence: current smoker 2ppd-1/2ppd.    -not interested in quitting, counseled for cessation   -nicotine patch   ??   Fall 6/10   - CT head:??No significant intracranial abnormalities   - CT neck:??Avulsion fracture of the tip of the T1 spinous process, probably chronic   - NSGY consult placed??   ??   Homelessness   -case management consult for resources   ??         Constitutional: No acute distress, cooperative, pleasant??   ENT: Oral mucosa moist, oropharynx benign.    Resp: CTA bilaterally. No wheezing/rhonchi/rales. No accessory muscle use   CV: Regular rhythm, normal rate, no murmurs, gallops, rubs   ??GI: Soft, non distended, non tender. normoactive bowel sounds, no hepatosplenomegaly    ??Musculoskeletal: No edema, warm, 2+ pulses throughout   ??Neurologic: Moves all extremities. ??AAOx3, CN II-XII reviewed   ???? ?? ?? ?? ?? ?? ?? ?? ?? ?? ??Psych: ??Good insight, positive anxious nor agitated.      ??   Substance-Related Disorders - Care Day 1 (04/10/2019) by Tyna Jaksch, RN     ??   Review Entered Review Status   04/11/2019 12:14 Completed   ??   Criteria Review      Care Day: 1 Care Date: 04/10/2019 Level of Care: Telemetry    Guideline Day 1    Clinical Status    (  X) * Clinical Indications met [O]    04/11/2019 12:14:29 EDT by Tyna Jaksch    ?? YES. SEEE INDICATIONS.    Interventions    (X) Possible IV fluids, medications    04/11/2019 12:14:29 EDT by Tyna Jaksch    ?? MORPHINE 0.'5MG'$  IV Q6H PRN (4 DOSES).    (X) Possible continual observation or frequent safety checks [Q]    04/11/2019 12:14:29 EDT by Tyna Jaksch    ?? SEIZURE PRECAUTIONS.  CIWA MONITORING.  CONTINUOUS VS.    Medications    (X) Medication for withdrawal or dependence    04/11/2019 12:14:29 EDT by Tyna Jaksch    ?? ATIVAN '1MG'$  IV Q4H PRN (5 DOSES).    (X) Thiamine and multivitamin supplement for alcohol use disorder or dependence [R]    04/11/2019 12:14:29 EDT by Tyna Jaksch    ?? FOLVITE '1MG'$  PO QD.  MVI ONE PO QD.  THIAMINE '100MG'$  PO QD.    Evaluation    (X) Exploration of admission precipitants [S]     04/11/2019 12:14:29 EDT by Tyna Jaksch    ?? He is a widow. ??He has a daughter. ??He is homeless.    (X) Substance use, psychiatric, medical, and social histories [M]    04/11/2019 12:14:29 EDT by Tyna Jaksch    ?? DONE.    (X) Psychiatric consultation (if attending physician is not psychiatrist)    04/11/2019 12:14:29 EDT by Tyna Jaksch    ?? COMPLETE. SEE ADDITIONAL NOTES.    (X) Mental status and physical examinations    04/11/2019 12:14:29 EDT by Tyna Jaksch    ?? DONE PER MD.    * Milestone   Additional Notes   04/10/19         98.1 P 87 RR 16 BP 135/77 SPO2 95% ROOM AIR.    POC GLUCOSE [id number].      INR: 1.0   Prothrombin time: 9.9   aPTT: 27.1   Fibrinogen: 574    D-dimer: 0.49   Sodium: 135    Potassium: 4.0   Chloride: 104   CO2: 22   Anion gap: 9   Glucose: 195 (H)   BUN: 21 (H)   Creatinine: 1.28   BUN/Creatinine ratio: 16   Calcium: 8.7   GFR est non-AA: 56    GFR est AA: >60      FULL CODE.   CARDIAC REGULAR DIET.   DILAY DDIMER/FIBRINOGEN/PT/INR/PTT.   AMBULATE WITH ASSIST.   OT AND PT CONSULTS.   GLUCOSE MANAGEMENT PER PROTOCOL.       ASA '81MG'$  PO QD.   LIPITOR '40MG'$  PO QHS.   PEPCID '20MG'$  PO BID.   ZESTRIL '20MG'$  PO QD.   PERCOCET 5-'325MG'$  PO Q8H PRN.       [place].   HEPARIN 5000 U SC Q8H.   LISPRO SSI SC QACHS.      INTERNAL MEDICINE>   Interval history / Subjective:   Pt is emotionally labile, he is having lots of diarrhea , he has some hand pain from cath site    ??   Assessment & Plan:   ??   Atypical Chest Pain:??   - likely due to anxiety   -JTT:SVXBLT sinus rhythm,??Inferior infarct, age undetermined,??RBBB   -troponin negative x 3 negative   -ECHO: EF 55 - 60%. No regional wall motion abnormality noted. Mild (grade 1) left ventricular diastolic dysfunction.   -cardiology on board   - cardiac cath 6/9: Normal LVEDP.??   ??  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 Takes Xanax $RemoveBef'2mg'[id number]$  tid last filled in May 20th 90 pills but patientstates he lost his medication while travelling here. Last  dose was on Thursday 04/03/2019   - IV ativan prn??   ??   Severe anxiety/ Panic attacks??   C/w Hallucinations: hx of Bipolar and schizophrenia.??   - Auditory and visual hallucinations of deceased wife.??   -denies HI and SI, not on any mood medications except for Xanax   -seen by psyche , recommendation noted but pt does not want those medications as he thinks that makes him weak    - hydroxyzine prn??   ??   DM type II with hyperglycemia   - pt states he takes insulin??Humalog 50 U with breakfast, 30U with lunch and 50U with dinener   - HGA1c 9   - started on NPH??5U bid. C/w ISS   ??   [name]:    -lipid panel LDL 123   -??started on??statin   ????   HTN:??BP elevated   -??not on any meds per pharmacy   - started on lisinopril $RemoveBefor'20mg'[id number]$  daily????   -prn hydralazine   ??   COPD not in exacerbation   -CXR negative   -guaifenesin as needed for cough,??albuterol inhaler as needed   - COVID 19 negative ??   - recommend pulmonology and PFT eval as outpt??   ??   ETOH Dependence:    - drinks 12 beers/day.   -CIWA protocol   ??   Tobacco Dependence: current smoker 2ppd-1/2ppd.   -not interested in quitting, counseled for cessation   -nicotine patch   ??   Fall 6/10   - CT head:??No significant intracranial abnormalities   - CT neck:??Avulsion fracture of the tip of the T1 spinous process, probably chronic   - NSGY consult placed??   ??   Homelessness   -case management consult for resources      Constitutional: No acute distress, cooperative, pleasant??   ENT: Oral mucosa moist, oropharynx benign.    Resp: CTA bilaterally. No wheezing/rhonchi/rales. No accessory muscle use   CV: Regular rhythm, normal rate, no murmurs, gallops, rubs   ??GI: Soft, non distended, non tender. normoactive bowel sounds, no hepatosplenomegaly    ??Musculoskeletal: No edema, warm, 2+ pulses throughout   ??Neurologic: Moves all extremities. ??AAOx3, CN II-XII reviewed   ???? ?? ?? ?? ?? ?? ?? ?? ?? ?? ??Psych: ??Good insight, positive anxious nor agitated.      PSYCH>    PAST PSYCHIATRIC HISTORY: ??He stated he was seeing a psychiatrist in [place] and was prescribed Xanax 2 mg three times a day and prior to that was also prescribed Klonopin and Valium.   ??   PSYCHOSOCIAL HISTORY: ??He is a widow. ??He has a daughter. ??He is homeless.   ??   MENTAL STATUS EXAMINATION: ??He is currently lying in bed, dressed in street clothes. ??He is labile during the interview. ??He reports his mood is not good. ??Affect is constricted. ??Speech normal rate and rhythm. ??Thought process: ??Logical and goal-directed. ??He denies suicidal ideation, homicidal ideation, auditory or visual hallucination. ??No paranoia or delusions. ??Memory is intact. ??Intelligence is average. ??Insight is poor. ??Judgment is poor.   ??   ASSESSMENT AND PLAN: ??The patient meets the criteria for unspecified mood disorder. ??I informed him that benzodiazepine is not something that we will be able to prescribe. ??I tried to talk to him about putting him on other classic antidepressants and/or  mood stabilizers, but he was not forthcoming. ??He said that he has tried all those medications, it did not work, and Xanax was the only medication that worked for him. ??Unfortunately, I was not able to come up with a definite plan with him because he kept asking for benzodiazepine. ??If he later changes his mind, please consider starting him on Cymbalta 20 mg daily to help with the depression, anxiety and pain. ??Please also consider Seroquel 25 mg every six hours as needed for hallucinations. ??Please continue CIWA protocol. ??He can followup with the local Energy manager, either [facility] or [facility]) for outpatient services. ??There is no indication for inpatient psychiatric admission. ??Please discharge to home once medically stable.   ??   Substance-Related Disorders - Clinical Indications for Admission to Inpatient Care by Belva Bertin, RN     ??   Review Entered Review Status    04/11/2019 12:10 Completed   ??   Criteria Review      Clinical Indications for Admission to Inpatient Care    Most Recent Editor: Belva Bertin Most Recent Date: 04/11/2019 12:10:13 EDT    (X) Admission to Inpatient Level of Care for Substance-Related Disorder for Adult is indicated    due to ??ALL ??of the following ??[A] [B] [C] (8) (9) (10) (11) (12) (13) (14) (15):    ???? (X) Patient risk or severity of substance-related disorder is appropriate to inpatient level of    ???? care as indicated by the presence of ??1 or more ??of the following ??[D] [E]:    ???? ?? ??( ) Withdrawal signs related to alcohol, sedative, or opioid with high-risk indicators as indicated    ???? ?? ??by ??1 or more ??of the following ??[F]:    ???? ?? ?? ?? ( ) Alcohol or sedative withdrawal signs with high-risk indicator are present as indicated by    ???? ?? ?? ?? ??ALL ??of the following ??[G] (5) (8) (10) (19) (20):    ???? ?? ?? ?? ?? ??(X) Signs of withdrawal as indicated by ??1 or more ??of the following :    ???? ?? ?? ?? ?? ?? ?? (X) Tremor    ???? ?? ?? ?? ?? ?? ?? 04/11/2019 12:10:13 EDT by Belva Bertin    ?? ?? ?? ?? ?? ?? ?? ?? ??ON ADMIT.    ???? ?? ?? ?? ?? ?? ?? (X) Other physical signs of alcohol or sedative withdrawal    ???? ?? ?? ?? ?? ?? ?? 04/11/2019 12:10:14 EDT by Belva Bertin    ?? ?? ?? ?? ?? ?? ?? ?? ??ANXIETY.    ???? ?? ??(X) Acute toxicity or instability from substance use requiring inpatient care (eg, hallucinations    ???? ?? ??in absence of delirium) for which treatment at lower level of care (eg, emergency department,    ???? ?? ??crisis care, observation care) is not feasible or is inappropriate (eg, less intensive    ???? ?? ??level is unavailable or not suitable to patient condition or treatment history) (26)    ???? ?? ??04/11/2019 12:10:14 EDT by Belva Bertin    ?? ?? ?? ?? Severe anxiety/ Panic attacks ??  C/w Hallucinations: hx of Bipolar and schizophrenia. ??  - Auditory and visual hallucinations of deceased wife.    ???? (X) Treatment services available at proposed level of care are necessary to meet patient needs     ???? and ??1 or more ??of the following ??[J]:    ???? ?? ??(  X) Specific condition related to admission diagnosis is present and judged likely to deteriorate    ???? ?? ??in absence of treatment at proposed level of care.    ???? ?? ??04/11/2019 12:10:14 EDT by Tyna Jaksch    ?? ?? ?? ?? seen by psyche , recommendation noted but pt does not want those medications as he thinks that makes him weak    ???? (X) Situation and expectations are appropriate for inpatient care for adult as indicated by ??1    ???? or more ??of the following ??(4) (9) (21) (27) (28):    ???? ?? ??(X) Around-the-clock medical and nursing care to address symptoms and initiate intervention is    ???? ?? ??required; specific need is identified. [K]    ???? ?? ??04/11/2019 12:10:14 EDT by Tyna Jaksch    ?? ?? ?? ?? CIWA MONITORING.  SEIZURE PRECAUTIONS.   ??

## 2019-04-15 NOTE — Progress Notes (Addendum)
2345: Patient requested IV ativan. RN flushed IV before giving ativan and IV was leaking. Patient became very upset stating, "I told you al my IV was bad". RN told him the IV did not leak when RN gave morphine at 2030. RN attempted once unsuccessful. Dana RN attempted, third attempt was successful. Patient crying and upset stating, "I told them all day my IV was bad and no one listened". IV had been working properly from 2030-2345. Patient given IV ativan once new IV was in.   0130: Patient sleeping.  0255: RN went into patients room and asked patient if he needed anything for pain. Patient stated no multiple times. Patient then turned on light and asked RN to pick up the charger on the floor. Patient stated, "You see that? One of you guys who came in here broke my $12 charger". RN appologized for the charger being broken. Patient not making eye contact and has flat affect. Patient stable.   0445: Patient refusing pain medication although patient is moaning out in pain. RN has asked numerous times if patient needs something for pain, patient denying.   0637: RN went into room to give patient insulin. RN asked patient if he was in pain. Patient stated he was in a 12/10 pain. RN asked patient if he would like IV morphine or percocet PO. Patient stated he did not want any pain medication. Patient asked RN to have IV removed- Dana RN had placed new IV at 0015. RN told patient he needed to keep IV bc he is a diabetic, and for emergencies. Patient started to yell and stated, "If you do not take this IV out now I will!". Desiree RN stepped into room. Desiree and this RN told patient he could not take IV out. Both RN's asked patient if he wanted IV ativan or pain medication. Patient told both RNs "just leave me alone and let me lay here". Patient stable, will continue to monitor.

## 2019-04-15 NOTE — Progress Notes (Signed)
[name]                                                                                          Hospitalist Progress Note  Derek RathkeMahmud Kayly Kriegel, MD  Answering service: [phone] from in house phone        Date of Service:  04/15/2019  NAME:  Derek Mclaughlin  DOB:  [birth year]  MRN:  [id number]      Admission Summary:   Derek Mclaughlin??is a 68 y.o.??male??who presents with PMH of DM, [name], HTN, [name], COPD here with c/o chest pain??x1 week and worsening??since last night??with associated??N/V and also having shortness of breath and cough.??He describes pain as??tight??heaviness to left side of chest??and radiates up to neck??and jaw. ??He is visiting from??[place]??and??states he was supposed to living with friends when he got here but the house was vacant when he arrived.??Has lived in motel until he ran out of money now homeless.??He has been out of medications??for about 1 week prior to coming to ED. He??states he has a history of alcohol dependence and??restarted drinking alcohol about 2 weeks??prior to coming to ED??and drinks 12 beers/day. He??states he has a hx of bipolar and schizophrenia and has had an increase in symptoms of depression over last few months, he stated??he doesn't care if he lives but denies suicidal and homicidal ideations.He??states he is??having??auditory and visual??hallucinations??x2 weeks where he hears his deceased wifes voice and thinks he has seen her in a room with him.??  Work up in ED: negative CXR, EKG, cardiology consult, ECHO ordered, labs reviewed, psych consult, r/o COVID. 04/07/2019  ??    Interval history / Subjective:   Stopped all IV pin meds and benzos   PRN oral d/w CM ready for d/c  awaiting for placement   No overnight issues BS high will review insulin      Assessment & Plan:     Atypical Chest Pain:??resolved   - likely due to anxiety  -WGN:FAOZHYEKG:Normal sinus rhythm,??Inferior infarct, age undetermined,??RBBB  -troponin negative x 3 negative   -ECHO: EF 55 - 60%. No regional wall motion abnormality noted. Mild (grade 1) left ventricular diastolic dysfunction.  -cardiology on board  - cardiac cath 6/9: Normal LVEDP.   ??  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 Takes Xanax 2mg  tid last filled in May 20th 90 pills but patientstates he lost his medication while travelling here. Last dose was on Thursday 04/03/2019  - IV ativan prn   ??  Severe anxiety/ Panic attacks   C/w Hallucinations: hx of Bipolar and schizophrenia.??  - Auditory and visual hallucinations of deceased wife.??  -denies HI and SI, not on any mood medications except for Xanax  -seen by psyche , recommendation noted but pt does not want those medications as he thinks that makes him weak   - hydroxyzine prn   ??  DM type II with hyperglycemia  - SSI   - HGA1c 9  - started on NPH 15 U bid. C/w ISS  ??  [name]:   -lipid panel LDL 123  - started on statin  ????  HTN: BP elevated  - not on any meds per pharmacy  - started on lisinopril 20mg  daily    -prn hydralazine  ??  COPD not in exacerbation  -CXR negative  -guaifenesin as needed for cough, albuterol inhaler as needed  - COVID 19 negative ??  - recommend pulmonology and PFT eval as outpt   ??  ETOH Dependence:   - drinks 12 beers/day.  -CIWA protocol  ??  Tobacco Dependence: current smoker 2ppd-1/2ppd.  -not interested in quitting, counseled for cessation  -nicotine patch  ??  Fall 6/10  - CT head: No significant intracranial abnormalities  - CT neck: Avulsion fracture of the tip of the T1 spinous process, probably chronic  - NSGY consult placed   ??  Homelessness  -case management consult for resources    Code status: Full  DVT prophylaxis: SCD    Care Plan discussed with: Patient/Family and Nurse  Anticipated Disposition: Home w/Family  Anticipated Discharge: 24 hours to 48 hours     Hospital Problems  Date Reviewed: 04/07/2019          Codes Class Noted POA    Schizophrenia (HCC) ICD-10-CM: F20.9  ICD-9-CM: 295.90  04/09/2019 Yes         Bipolar 1 disorder, depressed, severe (HCC) ICD-10-CM: F31.4  ICD-9-CM: 296.53  04/09/2019 Yes        Tobacco abuse ICD-10-CM: Z72.0  ICD-9-CM: 305.1  04/09/2019 Yes        Alcohol abuse ICD-10-CM: F10.10  ICD-9-CM: 305.00  04/09/2019 Yes        Chest pain ICD-10-CM: R07.9  ICD-9-CM: 786.50  04/07/2019 Yes                Review of Systems:   A comprehensive review of systems was negative.       Vital Signs:    Last 24hrs VS reviewed since prior progress note. Most recent are:  Visit Vitals  BP 171/89 (BP 1 Location: Left arm, BP Patient Position: At rest)   Pulse 85   Temp 98.3 ??F (36.8 ??C)   Resp 16   Ht 5\' 10"  (1.778 m)   Wt 116.9 kg (257 lb 11.2 oz)   SpO2 95%   BMI 36.98 kg/m??       No intake or output data in the 24 hours ending 04/15/19 1127     Physical Examination:             Constitutional:  No acute distress, cooperative, pleasant    ENT:  Oral mucosa moist, oropharynx benign.    Resp:  CTA bilaterally. No wheezing/rhonchi/rales. No accessory muscle use   CV:  Regular rhythm, normal rate, no murmurs, gallops, rubs    GI:  Soft, non distended, non tender. normoactive bowel sounds, no hepatosplenomegaly     Musculoskeletal:  No edema, warm, 2+ pulses throughout    Neurologic:  Moves all extremities.  AAOx3, CN II-XII reviewed     Psych:  Good insight, positive anxious nor agitated.       Data Review:    I personally reviewed  Image and Lbs      Labs:     No results for input(s): WBC, HGB, HCT, PLT, HGBEXT, HCTEXT, PLTEXT, HGBEXT, HCTEXT, PLTEXT in the last 72 hours.  No results for input(s): NA, K, CL, CO2, BUN, CREA, GLU, CA, MG, PHOS, URICA in the last 72 hours.  No results for input(s): ALT, AP, TBIL, TBILI, TP, ALB, GLOB, GGT, AML, LPSE in the last  72 hours.    No lab exists for component: SGOT, GPT, AMYP, HLPSE  Recent Labs     04/15/19  0015 04/14/19  0341 04/13/19  0350   INR 1.0 1.0 1.0   PTP 10.1 10.3 10.0   APTT 26.0 26.9 26.3      No results for input(s): FE, TIBC, PSAT, FERR in the last 72 hours.    No results found for: FOL, RBCF   No results for input(s): PH, PCO2, PO2 in the last 72 hours.  No results for input(s): CPK, CKNDX, TROIQ in the last 72 hours.    No lab exists for component: CPKMB  Lab Results   Component Value Date/Time    Cholesterol, total 201 (H) 04/08/2019 12:29 AM    HDL Cholesterol 36 04/08/2019 12:29 AM    LDL, calculated 123.2 (H) 16/10/960406/07/2019 12:29 AM    Triglyceride 209 (H) 04/08/2019 12:29 AM    CHOL/HDL Ratio 5.6 (H) 04/08/2019 12:29 AM     Lab Results   Component Value Date/Time    Glucose (POC) 158 (H) 04/15/2019 11:07 AM    Glucose (POC) 146 (H) 04/15/2019 06:11 AM    Glucose (POC) 204 (H) 04/14/2019 09:16 PM    Glucose (POC) 193 (H) 04/14/2019 04:19 PM    Glucose (POC) 190 (H) 04/14/2019 11:08 AM     Lab Results   Component Value Date/Time    Color YELLOW/STRAW 04/08/2019 02:07 AM    Appearance CLEAR 04/08/2019 02:07 AM    Specific gravity 1.019 04/08/2019 02:07 AM    pH (UA) 5.0 04/08/2019 02:07 AM    Protein 100 (A) 04/08/2019 02:07 AM    Glucose Negative 04/08/2019 02:07 AM    Ketone Negative 04/08/2019 02:07 AM    Bilirubin Negative 04/08/2019 02:07 AM    Urobilinogen 0.2 04/08/2019 02:07 AM    Nitrites Negative 04/08/2019 02:07 AM    Leukocyte Esterase Negative 04/08/2019 02:07 AM    Epithelial cells FEW 04/08/2019 02:07 AM    Bacteria Negative 04/08/2019 02:07 AM    WBC 0-4 04/08/2019 02:07 AM    RBC 50-100 04/08/2019 02:07 AM         Medications Reviewed:     Current Facility-Administered Medications   Medication Dose Route Frequency   ??? LORazepam (ATIVAN) tablet 0.5 mg  0.5 mg Oral Q6H PRN   ??? insulin NPH (NOVOLIN N, HUMULIN N) injection 15 Units  15 Units SubCUTAneous ACB/HS   ??? senna-docusate (PERICOLACE) 8.6-50 mg per tablet 1 Tab  1 Tab Oral DAILY   ??? oxyCODONE-acetaminophen (PERCOCET) 5-325 mg per tablet 1 Tab  1 Tab Oral Q8H PRN   ??? thiamine HCL (B-1) tablet 100 mg  100 mg Oral DAILY   ??? folic acid (FOLVITE) tablet 1 mg  1 mg Oral DAILY    ??? therapeutic multivitamin (THERAGRAN) tablet 1 Tab  1 Tab Oral DAILY   ??? lisinopriL (PRINIVIL, ZESTRIL) tablet 20 mg  20 mg Oral DAILY   ??? sodium chloride (NS) flush 5-40 mL  5-40 mL IntraVENous Q8H   ??? sodium chloride (NS) flush 5-40 mL  5-40 mL IntraVENous PRN   ??? famotidine (PEPCID) tablet 20 mg  20 mg Oral BID   ??? hydrOXYzine HCL (ATARAX) tablet 25 mg  25 mg Oral TID PRN   ??? atorvastatin (LIPITOR) tablet 40 mg  40 mg Oral QHS   ??? sodium chloride (NS) flush 5-40 mL  5-40 mL IntraVENous Q8H   ??? sodium chloride (NS) flush 5-40 mL  5-40 mL IntraVENous PRN   ??? heparin (porcine) injection 5,000 Units  5,000 Units SubCUTAneous Q8H   ??? glucose chewable tablet 16 g  4 Tab Oral PRN   ??? glucagon (GLUCAGEN) injection 1 mg  1 mg IntraMUSCular PRN   ??? insulin lispro (HUMALOG) injection   SubCUTAneous AC&HS   ??? acetaminophen (TYLENOL) tablet 650 mg  650 mg Oral Q4H PRN   ??? hydrALAZINE (APRESOLINE) 20 mg/mL injection 20 mg  20 mg IntraVENous Q6H PRN   ??? guaiFENesin (ROBITUSSIN) 100 mg/5 mL oral liquid 100 mg  100 mg Oral Q4H PRN   ??? ondansetron (ZOFRAN) injection 4 mg  4 mg IntraVENous Q4H PRN   ??? nicotine (NICODERM CQ) 14 mg/24 hr patch 1 Patch  1 Patch TransDERmal DAILY   ??? albuterol (PROVENTIL HFA, VENTOLIN HFA, PROAIR HFA) inhaler 2 Puff  2 Puff Inhalation Q4H PRN   ??? aspirin chewable tablet 81 mg  81 mg Oral DAILY     ______________________________________________________________________  EXPECTED LENGTH OF STAY: 2d 14h  ACTUAL LENGTH OF STAY:          7                 Derek Rathke, MD

## 2019-04-15 NOTE — Other (Signed)
[facility]  CLINICAL NURSE SPECIALIST CONSULT  PROGRAM FOR [facility]AB[facility]ES [facility]    FOLLOW-UP NOTE    Presentation   Derek Mclaughlin is a 68 y.o. male with a PMH of DM, [name], HTN, [name] s/p stent placement, COPD, Afib, amxiety, MI, schizophrenia and alcohol abuse who presented to the ED with a c/o chest pain for a week with N/V/SOB and cough.  He is visiting from??[place]??and??states he was supposed to living with friends when he got here but the house was vacant when he arrived.??Has lived in [place] until he ran out of money now homeless.??He has been out of medications??for about 1 week prior to coming to ED. Also verbalized he was experiencing auditory and visual hallucinations the past two weeks.  In the ED he was admitted for a cardiac work-up.    Subjective   ???I am in much better spirits today.???  Case Management assisting with referrals to inpatient ETOH facilities after discharge  NPH adjusted to 15 units BID; BG today within target: 146, 158.    Objective   Physical exam  General Alert, oriented and in no acute distress/ill-appearing. Conversant and cooperative.  Vital Signs   Visit Vitals  BP 171/89 (BP 1 Location: Left arm, BP Patient Position: At rest)   Pulse 85   Temp 98.3 ??F (36.8 ??C)   Resp 16   Ht _0  (1.778 m)   Wt 116.9 kg (257 lb 11.2 oz)   SpO2 95%   BMI 36.98 kg/m??     Skin  Warm and dry  Heart   Regular rate and rhythm. No murmurs, rubs or gallops  Lungs  Clear to auscultation without rales or rhonchi  Extremities No foot wounds    Laboratory  Lab Results   Component Value Date/Time    Hemoglobin A1c 9.1 (H) 04/07/2019 04:49 PM     Lab Results   Component Value Date/Time    LDL, calculated 123.2 (H) 04/08/2019 12:29 AM     Lab Results   Component Value Date/Time    Creatinine 1.17 04/12/2019 01:54 AM     Lab Results   Component Value Date/Time    Sodium 135 (L) 04/12/2019 01:54 AM    Potassium 4.1 04/12/2019 01:54 AM    Chloride 105 04/12/2019 01:54 AM    CO2 24 04/12/2019 01:54 AM     Anion gap 6 04/12/2019 01:54 AM    Glucose 196 (H) 04/12/2019 01:54 AM    BUN 26 (H) 04/12/2019 01:54 AM    Creatinine 1.17 04/12/2019 01:54 AM    BUN/Creatinine ratio 22 (H) 04/12/2019 01:54 AM    GFR est AA >60 04/12/2019 01:54 AM    GFR est non-AA >60 04/12/2019 01:54 AM    Calcium 8.8 04/12/2019 01:54 AM    Bilirubin, total 0.2 04/12/2019 01:54 AM    Alk. phosphatase 74 04/12/2019 01:54 AM    Protein, total 6.6 04/12/2019 01:54 AM    Albumin 2.8 (L) 04/12/2019 01:54 AM    Globulin 3.8 04/12/2019 01:54 AM    A-G Ratio 0.7 (L) 04/12/2019 01:54 AM    ALT (SGPT) 32 04/12/2019 01:54 AM     Lab Results   Component Value Date/Time    ALT (SGPT) 32 04/12/2019 01:54 AM       Blood glucose pattern        Assessment and Plan   Nursing Diagnosis Risk for unstable blood glucose pattern   Nursing Intervention Domain 5250 Decision-making Support   Nursing Interventions Examined current inpatient  diabetes control   Explored factors facilitating and impeding inpatient management     Evaluation   Derek Mclaughlin is a 67 y.o. male with a PMH of DM, [name], HTN, [name] s/p stent placement, COPD, Afib, amxiety, MI, schizophrenia and alcohol abuse who presented to the ED with a c/o chest pain for a week with N/V/SOB and cough.  He is visiting from??[place]??and??states he was supposed to living with friends when he got here but the house was vacant when he arrived.??Has lived in [place] until he ran out of money now homeless.??His medications and glucometer were lost during his transit to [place].  Unfortunately, due to financial limitations, his diet and food options will be limited on discharge.  At this time, his blood glucose is in goal of 100-180.     Update: BG trending down within target range today after basal insulin increased yesterday.  Adding in mealtime bolus insulin would assist with post prandial hyperglycemia -  Recommendations/ Discharge Planning   Recommend:  1. Agree with advancement to 15 units NPH BID   Please give additional 5 units NPH x1 now as he received 10 units NPH this morning.    2. Adjust correctional insulin to resistance scale ACHS    3.  Add moderate dose mealtime Humalog: 8 units Humalog with meals as patient experiencing pre-prandial hyperglycemia.    Hold if patient consumes less than 50% of carbohydrates on meal tray      On Discharge:  Given his A1C was 9.1% and cost and resources are a supply concern.    -It would be recommended to resume his PTA metformin 1000 mg daily.    -Would add NPH 15 units BID  -Continue Lispro with meals with a dose adjustment: 10 units with meals  -Will need to establish a PCP- per case management recommendations   -Will need a new glucometer kit:  Check glucose twice daily (at breakfast and dinner)      Billing Code(s)     Thank you for including [place] in their care.  I spent 15 minutes in direct patient care today for this patient.  Time includes chart review, face to face with patient and collaboration with interdisciplinary care team.      Lily Lovings, CNS  Access via [place] (C) [phone]

## 2019-04-15 NOTE — Other (Signed)
Looked into chart to assist Primary RN with medications.

## 2019-04-15 NOTE — Progress Notes (Signed)
Patient is tearful and wanted someone to talk to. Laquita, PCT sat with patient and talked with for a while. Patient wants us to know he is sorry if he has been a "bad" patient. He also wanted us to know how thankful he is for our care and kindness.    Patient also expressed that he feels his anxiety and depression is worsening. He was tearful about getting labs in the morning because he had been stuck multiple times today for a new IV. I called the on call hospitalist, Mike and he said we will hold labs for tonight. I relayed this information to the patient and he was very thankful.

## 2019-04-15 NOTE — Progress Notes (Signed)
TOC:    CM spoke to Andre Young at [facility].[phone].  Per Andre,patient has qualifying diagnoses.      CM faxed referral and clinicals to [facility] [phone].    CM called [facility] [phone] and spoke to Dr. George.  He said that they would probably be able to accept patient in 1-2 weeks.  CM printed application to give to patient to complete.  Per Dr. George, patient could possibly transition to this program after [facility].      CM spoke to Lekeyta Myles at [facility], a substance abuse center in [place] where the patient has been before. [phone].  CM faxed application and clinicals to [phone].      Kristi Nelligan, RN/CRM

## 2019-04-15 NOTE — Progress Notes (Signed)
Problem: Mobility Impaired (Adult and Pediatric)  Goal: *Acute Goals and Plan of Care (Insert Text)  Description: FUNCTIONAL STATUS PRIOR TO ADMISSION: Patient reports that he was modified independent using a rollator for functional mobility. He reports that his rollator was on the bus that he was on as [place] traveled here [place] and the rollator did not make it off the bus. He report that he has called the bus company in an effort to retrieve his rollator. He reports that he bought that rollator outright, did not go through his insurance. When asked, he reports 6 falls in the last 12 months. [facility] this [facility] amb back from the bathroom.    HOME SUPPORT PRIOR TO ADMISSION: The patient reports that he has a home in [place] and has family living in that home and that he does not know where he is going to live but that he will not be living there, please refer to care management notes.    Physical Therapy Goals  Initiated 04/10/2019  1.  Patient will move from supine to sit and sit to supine , scoot up and down and roll side to side in bed with independence within 7 day(s).    2.  Patient will transfer from bed to chair and chair to bed with modified independence using the least restrictive device within 7 day(s).  3.  Patient will perform sit to stand with modified independence within 7 day(s).  4.  Patient will ambulate with modified independence for 50 feet with the least restrictive device within 7 day(s).   Stairs goal as needed   Outcome: Progressing Towards Goal  PHYSICAL THERAPY TREATMENT  Patient: Derek Mclaughlin ([age] y.o. male)  Date: 04/15/2019  Diagnosis: Chest pain [R07.9]  Chest pain [R07.9]   <principal problem not specified>  Procedure(s) (LRB):  LEFT HEART CATH / CORONARY ANGIOGRAPHY (N/A)  Fractional Flow Reserve (N/A) 7 Days Post-Op  Precautions: Contact, Bed Alarm, Fall(enteric)  Chart, physical therapy assessment, plan of care and goals were reviewed.     ASSESSMENT  Patient continues with skilled PT services and is progressing towards goals. He is Mod I for functional transfers and has been up ad lib throughout the day. He was able to complete approx 310 Ft of gait w/o A.D., although reports he would use a cane for additional support PTA. He completed 8 steps using both rails w/ CGA-SBA; demonstrated reciprocal pattern when ascending stairs, but unable to do the same for descend, placing both feet on same step at a time. He returned to sitting EOB w/ all items in reach.     Current Level of Function Impacting Discharge (mobility/balance): Mod I- CGA for functional transfers and gait     Other factors to consider for discharge: housing difficulty         PLAN :  Patient continues to benefit from skilled intervention to address the above impairments.  Continue treatment per established plan of care.  to address goals.    Recommendation for discharge: (in order for the patient to meet his/her long term goals)  No skilled physical therapy/ follow up rehabilitation needs identified at this time.    This discharge recommendation:  Has been made in collaboration with the attending provider and/or case management    IF patient discharges home will need the following DME: straight cane       SUBJECTIVE:   Patient stated ???Thank you all for everything you've done for  me.???    OBJECTIVE DATA SUMMARY:   Critical Behavior:  Neurologic State: Alert  Orientation Level: Oriented X4  Cognition: Follows commands  Safety/Judgement: Decreased awareness of need for assistance, Decreased awareness of need for safety  Functional Mobility Training:  Bed Mobility:     Supine to Sit: (sitting EOB)  Sit to Supine: (returned to EOB)           Transfers:  Sit to Stand: Modified independent  Stand to Sit: Modified independent                             Balance:  Sitting: Intact  Standing: Intact  Standing - Static: Good  Ambulation/Gait Training:  Distance (ft): 310 Feet (ft)   Assistive Device: Gait belt  Ambulation - Level of Assistance: Stand-by assistance;Assist x1        Gait Abnormalities: Decreased step clearance;Path deviations;Trunk sway increased        Base of Support: Shift to left;Widened  Stance: Left decreased  Speed/Cadence: Pace decreased (<100 feet/min)  Step Length: Left shortened;Right shortened    Stairs:  Number of Stairs Trained: 8  Stairs - Level of Assistance: Stand-by assistance;Contact guard assistance   Rail Use: Both      Pain Rating:  No c/o pain; pt reports he just received pain meds and ativan prior to PT.     Activity Tolerance:   Good  Please refer to the flowsheet for vital signs taken during this treatment.    After treatment patient left in no apparent distress:   Call bell within reach and sitting EOB     COMMUNICATION/COLLABORATION:   The patient???s plan of care was discussed with: Registered nurse and Case management.     Miranda A Means,PTA   Time Calculation: 16 mins

## 2019-04-15 NOTE — Progress Notes (Signed)
 Problem: Mobility Impaired (Adult and Pediatric)  Goal: *Acute Goals and Plan of Care (Insert Text)  Description: FUNCTIONAL STATUS PRIOR TO ADMISSION: Patient reports that he was modified independent using a rollator for functional mobility. He reports that his rollator was on the bus that he was on as he traveled here to [place] from [place]  and the rollator did not make it off the bus. He report that he has called the bus company in an effort to retrieve his rollator. He reports that he bought that rollator outright, did not go through his insurance. When asked, he reports 6 falls in the last 12 months. He had a fall here at [facility] this admission amb back from the bathroom.    HOME SUPPORT PRIOR TO ADMISSION: The patient reports that he has a home in [place] and has family living in that home and that he does not know where he is going to live but that he will not be living there, please refer to care management notes.    Physical Therapy Goals  Initiated 04/10/2019  1.  Patient will move from supine to sit and sit to supine , scoot up and down and roll side to side in bed with independence within 7 day(s).    2.  Patient will transfer from bed to chair and chair to bed with modified independence using the least restrictive device within 7 day(s).  3.  Patient will perform sit to stand with modified independence within 7 day(s).  4.  Patient will ambulate with modified independence for 50 feet with the least restrictive device within 7 day(s).   Stairs goal as needed   Outcome: Progressing Towards Goal  PHYSICAL THERAPY TREATMENT  Patient: Derek Mclaughlin (68 y.o. male)  Date: 04/15/2019  Diagnosis: Chest pain [R07.9]  Chest pain [R07.9]   <principal problem not specified>  Procedure(s) (LRB):  LEFT HEART CATH / CORONARY ANGIOGRAPHY (N/A)  Fractional Flow Reserve (N/A) 7 Days Post-Op  Precautions: Contact, Bed Alarm, Fall(enteric)  Chart, physical therapy assessment, plan of care and goals were  reviewed.    ASSESSMENT  Patient continues with skilled PT services and is progressing towards goals. He is Mod I for functional transfers and has been up ad lib throughout the day. He was able to complete approx 310 Ft of gait w/o A.D., although reports he would use a cane for additional support PTA. He completed 8 steps using both rails w/ CGA-SBA; demonstrated reciprocal pattern when ascending stairs, but unable to do the same for descend, placing both feet on same step at a time. He returned to sitting EOB w/ all items in reach.     Current Level of Function Impacting Discharge (mobility/balance): Mod I- CGA for functional transfers and gait     Other factors to consider for discharge: housing difficulty         PLAN :  Patient continues to benefit from skilled intervention to address the above impairments.  Continue treatment per established plan of care.  to address goals.    Recommendation for discharge: (in order for the patient to meet his/her long term goals)  No skilled physical therapy/ follow up rehabilitation needs identified at this time.    This discharge recommendation:  Has been made in collaboration with the attending provider and/or case management    IF patient discharges home will need the following DME: straight cane       SUBJECTIVE:   Patient stated "Thank you all for everything you've done for  me."    OBJECTIVE DATA SUMMARY:   Critical Behavior:  Neurologic State: Alert  Orientation Level: Oriented X4  Cognition: Follows commands  Safety/Judgement: Decreased awareness of need for assistance, Decreased awareness of need for safety  Functional Mobility Training:  Bed Mobility:     Supine to Sit: (sitting EOB)  Sit to Supine: (returned to EOB)           Transfers:  Sit to Stand: Modified independent  Stand to Sit: Modified independent                             Balance:  Sitting: Intact  Standing: Intact  Standing - Static: Good  Ambulation/Gait Training:  Distance (ft): 310 Feet  (ft)  Assistive Device: Gait belt  Ambulation - Level of Assistance: Stand-by assistance;Assist x1        Gait Abnormalities: Decreased step clearance;Path deviations;Trunk sway increased        Base of Support: Shift to left;Widened  Stance: Left decreased  Speed/Cadence: Pace decreased (<100 feet/min)  Step Length: Left shortened;Right shortened    Stairs:  Number of Stairs Trained: 8  Stairs - Level of Assistance: Stand-by assistance;Contact guard assistance   Rail Use: Both      Pain Rating:  No c/o pain; pt reports he just received pain meds and ativan prior to PT.     Activity Tolerance:   Good  Please refer to the flowsheet for vital signs taken during this treatment.    After treatment patient left in no apparent distress:   Call bell within reach and sitting EOB     COMMUNICATION/COLLABORATION:   The patient's plan of care was discussed with: Registered nurse and Case management.     Miranda A Means,PTA   Time Calculation: 16 mins

## 2019-04-15 NOTE — Progress Notes (Signed)
 Patient is tearful and wanted someone to talk to. Laquita, PCT sat with patient and talked with for a while. Patient wants us  to know he is sorry if he has been a bad patient. He also wanted us  to know how thankful he is for our care and kindness.    Patient also expressed that he feels his anxiety and depression is worsening. He was tearful about getting labs in the morning because he had been stuck multiple times today for a new IV. I called the on call hospitalist, Garrel and he said we will hold labs for tonight. I relayed this information to the patient and he was very thankful.

## 2019-04-15 NOTE — Progress Notes (Signed)
TOC:    CM spoke to Druscilla Brownie at [employer], [phone].  Per Andre,patient has qualifying diagnoses.      CM faxed referral and clinicals to [facility] [phone].    CM called [facility] [phone] and spoke to Dr. Greggory Stallion.  He said that they would probably be able to accept patient in 1-2 weeks.  CM printed application to give to patient to complete.  Per Dr. Greggory Stallion, patient could possibly transition to this program after [facility].      CM spoke to [facility] at [facility], a substance abuse center in [place] where the patient has been before. [phone].  CM faxed application and clinicals to [phone].      Cheree Ditto, RN/CRM

## 2019-04-15 NOTE — Progress Notes (Signed)
2345: Patient requested IV ativan. RN flushed IV before giving ativan and IV was leaking. Patient became very upset stating, "I told you al my IV was bad". RN told him the IV did not leak when RN gave morphine at 2030. RN attempted once unsuccessful. Annabelle Harman RN attempted, third attempt was successful. Patient crying and upset stating, "I told them all day my IV was bad and no one listened". IV had been working properly from 2030-2345. Patient given IV ativan once new IV was in.   0130: Patient sleeping.  35: RN went into patients room and asked patient if he needed anything for pain. Patient stated no multiple times. Patient then turned on light and asked RN to pick up the charger on the floor. Patient stated, "You see that? One of you guys who came in here broke my $12 charger". RN appologized for the charger being broken. Patient not making eye contact and has flat affect. Patient stable.   8657: Patient refusing pain medication although patient is moaning out in pain. RN has asked numerous times if patient needs something for pain, patient denying.   8469: RN went into room to give patient insulin. RN asked patient if he was in pain. Patient stated he was in a 12/10 pain. RN asked patient if he would like IV morphine or percocet PO. Patient stated he did not want any pain medication. Patient asked RN to have IV removed- Annabelle Harman RN had placed new IV at 0015. RN told patient he needed to keep IV bc he is a diabetic, and for emergencies. Patient started to yell and stated, "If you do not take this IV out now I will!". Desiree RN stepped into room. Desiree and this RN told patient he could not take IV out. Both RN's asked patient if he wanted IV ativan or pain medication. Patient told both RNs "just leave me alone and let me lay here". Patient stable, will continue to monitor.

## 2019-04-15 NOTE — Progress Notes (Signed)
Progress Notes by Lucky RathkeUzzaman, [place] Souter, MD at 04/15/19 1127                Author: Lucky RathkeUzzaman, Brenly Trawick, MD  Service: Internal Medicine  Author Type: Physician       Filed: 04/15/19 1127  Date of Service: 04/15/19 1127  Status: Signed          Editor: Lucky RathkeUzzaman, Tomie Spizzirri, MD (Physician)                    [name]                                                                                              Hospitalist Progress Note   Lucky RathkeMahmud Amala Petion, MD   Answering service: [phone] OR [phone] from in house phone            Date of Service:  04/15/2019   NAME:  Derek PummelRobert Mclaughlin   DOB:  [birth year]   MRN:  [id number]           Admission Summary:        Molly MaduroRobert Mclaughlinis a 68 y.o.??male??who presents with PMH of DM, [name], HTN, [name], [name] ??He is visiting from??[place]??and??states he was supposed to living with friends when  he got here but the house was vacant when he arrived.??Has lived in motel until he ran out of money now homeless.??He has been out of medications??for about 1 week prior to coming to ED. He??states he has a history of alcohol dependence and??restarted  drinking alcohol about 2 weeks??prior to coming to ED??and drinks 12 beers/day. He??states he has a hx of bipolar and schizophrenia and has had an increase in symptoms of depression over last few months, he stated??he doesn't care if he lives  but denies suicidal and homicidal ideations.He??states he is??having??auditory and visual??hallucinations??x2 weeks where he hears his deceased wifes voice and thinks he has seen her in a room with him.??   Work up in ED: negative CXR, EKG, cardiology consult, ECHO ordered, labs reviewed, psych consult, r/o COVID. 04/07/2019   ??      Interval history / Subjective:        Stopped all IV  pin meds and benzos    PRN oral d/w CM ready for d/c  awaiting for placement    No overnight issues BS high will review insulin           Assessment & Plan:          Atypical Chest Pain:??resolved    - likely due to anxiety   -WGN:FAOZHYEKG:Normal sinus rhythm,??Inferior infarct, age undetermined,??RBBB   -troponin negative x 3 negative   -ECHO: EF 55 - 60%. No regional wall motion abnormality noted. Mild (grade 1) left ventricular diastolic dysfunction.   -cardiology on board   - cardiac cath 6/9: Normal LVEDP.    ??   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 Takes Xanax  tid last filled in May 20th 90 pills but patientstates he lost his medication while travelling here. Last dose was on Thursday 04/03/2019   - IV ativan prn    ??   Severe anxiety/ Panic attacks    C/w Hallucinations: hx of Bipolar and schizophrenia.??   - Auditory and visual hallucinations of deceased wife.??   -denies HI and SI, not on any mood medications except for Xanax   -seen by psyche , recommendation noted but pt does not want those medications as he thinks that makes him weak    - hydroxyzine prn    ??   DM type II with hyperglycemia   - SSI    - HGA1c 9   - started on NPH 15 U bid. C/w ISS   ??   [name]:    -lipid panel LDL 123   - started on statin   ????   HTN: BP elevated   - not on any meds per pharmacy   - started on lisinopril  daily     -prn hydralazine   ??   COPD not in exacerbation   -CXR negative   -guaifenesin as needed for cough, albuterol inhaler as needed   - COVID 19 negative ??   - recommend pulmonology and PFT eval as outpt    ??   ETOH Dependence:    - drinks 12 beers/day.   -CIWA protocol   ??   Tobacco Dependence: current smoker 2ppd-1/2ppd.   -not interested in quitting, counseled for cessation   -nicotine patch   ??   Fall 6/10   - CT head: No significant intracranial abnormalities   - CT neck: Avulsion fracture of the tip of the T1 spinous process, probably chronic   - NSGY consult placed    ??   Homelessness   -case  management consult for resources      Code status: Full   DVT prophylaxis: SCD      Care Plan discussed with: Patient/Family and Nurse   Anticipated Disposition: Home w/Family   Anticipated Discharge: 24 hours to 48 hours           Hospital Problems   Date Reviewed:  04/07/2019                         Codes  Class  Noted  POA              Schizophrenia (HCC)  ICD-10-CM: F20.9   ICD-9-CM: 295.90    04/09/2019  Yes                        Bipolar 1 disorder, depressed, severe (HCC)  ICD-10-CM: F31.4   ICD-9-CM: 296.53    04/09/2019  Yes                        Tobacco abuse  ICD-10-CM: Z72.0   ICD-9-CM: 305.1    04/09/2019  Yes                        Alcohol abuse  ICD-10-CM: F10.10   ICD-9-CM: 305.00    04/09/2019  Yes                        Chest pain  ICD-10-CM: R07.9   ICD-9-CM: 786.50    04/07/2019  Yes  Review of Systems:     A comprehensive review of systems was negative.            Vital Signs:      Last 24hrs VS reviewed since prior progress note. Most recent are:   Visit Vitals      BP  171/89 (BP 1 Location: Left arm, BP Patient Position: At rest)     Pulse  85     Temp  98.3 ??F (36.8 ??C)     Resp  16     Ht  5\' 10"  (1.778 m)     Wt  116.9 kg (257 lb 11.2 oz)     SpO2  95%        BMI  36.98 kg/m??           No intake or output data in the 24 hours ending 04/15/19 1127         Physical Examination:                     Constitutional:   No acute distress, cooperative, pleasant      ENT:   Oral mucosa moist, oropharynx benign.      Resp:   CTA bilaterally. No wheezing/rhonchi/rales. No accessory muscle use     CV:   Regular rhythm, normal rate, no murmurs, gallops, rubs         GI:   Soft, non distended, non tender. normoactive bowel sounds, no hepatosplenomegaly          Musculoskeletal:   No edema, warm, 2+ pulses throughout         Neurologic:   Moves all extremities.  AAOx3, CN II-XII reviewed       Psych:  Good insight, positive anxious nor agitated.               Data Review:      I  personally reviewed  Image and Lbs           Labs:        No results for input(s): WBC, HGB, HCT, PLT, HGBEXT, HCTEXT, PLTEXT, HGBEXT, HCTEXT, PLTEXT in the last 72 hours.   No results for input(s): NA, K, CL, CO2, BUN, CREA, GLU, CA, MG, PHOS, URICA in the last 72 hours.   No results for input(s): ALT, AP, TBIL, TBILI, TP, ALB, GLOB, GGT, AML, LPSE in the last 72 hours.      No lab exists for component: SGOT, GPT, AMYP, HLPSE     Recent Labs             04/15/19   0015  04/14/19   0341  04/13/19   0350     INR  1.0  1.0  1.0     PTP  10.1  10.3  10.0          APTT  26.0  26.9  26.3         No results for input(s): FE, TIBC, PSAT, FERR in the last 72 hours.    No results found for: FOL, RBCF    No results for input(s): PH, PCO2, PO2 in the last 72 hours.   No results for input(s): CPK, CKNDX, TROIQ in the last 72 hours.      No lab exists for component: CPKMB     Lab Results         Component  Value  Date/Time            Cholesterol, total  201 (H)  04/08/2019 12:29 AM       HDL Cholesterol  36  04/08/2019 12:29 AM       LDL, calculated  123.2 (H)  04/08/2019 12:29 AM       Triglyceride  209 (H)  04/08/2019 12:29 AM            CHOL/HDL Ratio  5.6 (H)  04/08/2019 12:29 AM          Lab Results         Component  Value  Date/Time            Glucose (POC)  158 (H)  04/15/2019 11:07 AM       Glucose (POC)  146 (H)  04/15/2019 06:11 AM       Glucose (POC)  204 (H)  04/14/2019 09:16 PM       Glucose (POC)  193 (H)  04/14/2019 04:19 PM            Glucose (POC)  190 (H)  04/14/2019 11:08 AM          Lab Results         Component  Value  Date/Time            Color  YELLOW/STRAW  04/08/2019 02:07 AM       Appearance  CLEAR  04/08/2019 02:07 AM       Specific gravity  1.019  04/08/2019 02:07 AM       pH (UA)  5.0  04/08/2019 02:07 AM       Protein  100 (A)  04/08/2019 02:07 AM       Glucose  Negative  04/08/2019 02:07 AM       Ketone  Negative  04/08/2019 02:07 AM       Bilirubin  Negative  04/08/2019 02:07 AM        Urobilinogen  0.2  04/08/2019 02:07 AM       Nitrites  Negative  04/08/2019 02:07 AM       Leukocyte Esterase  Negative  04/08/2019 02:07 AM       Epithelial cells  FEW  04/08/2019 02:07 AM       Bacteria  Negative  04/08/2019 02:07 AM       WBC  0-4  04/08/2019 02:07 AM            RBC  50-100  04/08/2019 02:07 AM                Medications Reviewed:          Current Facility-Administered Medications          Medication  Dose  Route  Frequency           ?  LORazepam (ATIVAN) tablet 0.5 mg   0.5 mg  Oral  Q6H PRN     ?  insulin NPH (NOVOLIN N, HUMULIN N) injection 15 Units   15 Units  SubCUTAneous  ACB/HS     ?  senna-docusate (PERICOLACE) 8.6-50 mg per tablet 1 Tab   1 Tab  Oral  DAILY     ?  oxyCODONE-acetaminophen (PERCOCET) 5-325 mg per tablet 1 Tab   1 Tab  Oral  Q8H PRN     ?  thiamine HCL (B-1) tablet 100 mg   100 mg  Oral  DAILY     ?  folic acid (FOLVITE) tablet 1 mg   1 mg  Oral  DAILY     ?  therapeutic multivitamin (  THERAGRAN) tablet 1 Tab   1 Tab  Oral  DAILY           ?  lisinopriL (PRINIVIL, ZESTRIL) tablet 20 mg   20 mg  Oral  DAILY           ?  sodium chloride (NS) flush 5-40 mL   5-40 mL  IntraVENous  Q8H     ?  sodium chloride (NS) flush 5-40 mL   5-40 mL  IntraVENous  PRN     ?  famotidine (PEPCID) tablet 20 mg   20 mg  Oral  BID     ?  hydrOXYzine HCL (ATARAX) tablet 25 mg   25 mg  Oral  TID PRN     ?  atorvastatin (LIPITOR) tablet 40 mg   40 mg  Oral  QHS     ?  sodium chloride (NS) flush 5-40 mL   5-40 mL  IntraVENous  Q8H     ?  sodium chloride (NS) flush 5-40 mL   5-40 mL  IntraVENous  PRN     ?  heparin (porcine) injection 5,000 Units   5,000 Units  SubCUTAneous  Q8H     ?  glucose chewable tablet 16 g   4 Tab  Oral  PRN     ?  glucagon (GLUCAGEN) injection 1 mg   1 mg  IntraMUSCular  PRN     ?  insulin lispro (HUMALOG) injection     SubCUTAneous  AC&HS     ?  acetaminophen (TYLENOL) tablet 650 mg   650 mg  Oral  Q4H PRN     ?  hydrALAZINE (APRESOLINE) 20 mg/mL injection 20 mg   20 mg   IntraVENous  Q6H PRN     ?  guaiFENesin (ROBITUSSIN) 100 mg/5 mL oral liquid 100 mg   100 mg  Oral  Q4H PRN     ?  ondansetron (ZOFRAN) injection 4 mg   4 mg  IntraVENous  Q4H PRN     ?  nicotine (NICODERM CQ) 14 mg/24 hr patch 1 Patch   1 Patch  TransDERmal  DAILY     ?  albuterol (PROVENTIL HFA, VENTOLIN HFA, PROAIR HFA) inhaler 2 Puff   2 Puff  Inhalation  Q4H PRN           ?  aspirin chewable tablet 81 mg   81 mg  Oral  DAILY        ______________________________________________________________________   EXPECTED LENGTH OF STAY: 2d 14h   ACTUAL LENGTH OF STAY:          7                    Lucky RathkeMahmud Samarth Ogle, MD

## 2019-04-15 NOTE — Group Note (Signed)
Diabetes Mgmt by Florian Buff, CNS at 04/15/19 1137                Author: Florian Buff, CNS  Service: Certified Clinical Nurse Specialist  Author Type: Clinical Nurse Specialist       Filed: 04/16/19 1052  Date of Service: 04/15/19 1137  Status: Signed          Editor: Florian Buff, CNS (Clinical Nurse Specialist)               Sondra Barges   CLINICAL NURSE SPECIALIST CONSULT   PROGRAM FOR DIABETES HEALTH      FOLLOW-UP NOTE        Presentation     Derek Mclaughlin is a 68 y.o. male with a PMH of DM, [name], HTN, [name] s/p stent placement, COPD, Afib, amxiety, MI, schizophrenia and alcohol abuse who presented  to the ED with a c/o chest pain for a week with N/V/SOB and cough.  [place] when he got here but the house was vacant when he arrived.Has lived in motel until he ran  out of money now homeless.He has been out of medicationsfor about 1 week prior to coming to ED. Also verbalized he was experiencing auditory and visual hallucinations the past two weeks.  In the ED he was admitted for a cardiac work-up.        Subjective     I am in much better spirits today.   Case Management assisting with referrals to inpatient ETOH facilities after discharge   NPH adjusted to 15 units BID; BG today within target: 146, 158.       Objective     Physical exam   General Alert, oriented and in no acute distress/ill-appearing. Conversant and cooperative.   Vital Signs    Visit Vitals      BP  171/89 (BP 1 Location: Left arm, BP Patient Position: At rest)     Pulse  85     Temp  98.3 F (36.8 C)     Resp  16     Ht  5\' 10"  (1.778 m)     Wt  116.9 kg (257 lb 11.2 oz)     SpO2  95%        BMI  36.98 kg/m        Skin  Warm and dry   Heart   Regular rate and rhythm. No murmurs, rubs or gallops   Lungs  Clear to auscultation without rales or rhonchi   Extremities No foot wounds      Laboratory     Lab Results         Component  Value   Date/Time            Hemoglobin A1c  9.1 (H)  04/07/2019 04:49 PM          Lab Results         Component  Value  Date/Time            LDL, calculated  123.2 (H)  04/08/2019 12:29 AM          Lab Results         Component  Value  Date/Time            Creatinine  1.17  04/12/2019 01:54 AM          Lab Results         Component  Value  Date/Time            Sodium  135 (L)  04/12/2019 01:54 AM       Potassium  4.1  04/12/2019 01:54 AM       Chloride  105  04/12/2019 01:54 AM       CO2  24  04/12/2019 01:54 AM       Anion gap  6  04/12/2019 01:54 AM       Glucose  196 (H)  04/12/2019 01:54 AM       BUN  26 (H)  04/12/2019 01:54 AM       Creatinine  1.17  04/12/2019 01:54 AM       BUN/Creatinine ratio  22 (H)  04/12/2019 01:54 AM       GFR est AA  >60  04/12/2019 01:54 AM       GFR est non-AA  >60  04/12/2019 01:54 AM       Calcium  8.8  04/12/2019 01:54 AM       Bilirubin, total  0.2  04/12/2019 01:54 AM       Alk. phosphatase  74  04/12/2019 01:54 AM       Protein, total  6.6  04/12/2019 01:54 AM       Albumin  2.8 (L)  04/12/2019 01:54 AM       Globulin  3.8  04/12/2019 01:54 AM       A-G Ratio  0.7 (L)  04/12/2019 01:54 AM            ALT (SGPT)  32  04/12/2019 01:54 AM          Lab Results         Component  Value  Date/Time            ALT (SGPT)  32  04/12/2019 01:54 AM           Blood glucose pattern                Assessment and Plan        Nursing Diagnosis  Risk for unstable blood glucose pattern     Nursing Intervention Domain  5250 Decision-making Support        Nursing Interventions  Examined current inpatient diabetes control    Explored factors facilitating and impeding inpatient management          Evaluation     Derek Mclaughlin is a 68 y.o. male with a PMH of DM, [name], HTN, [name] s/p stent placement, COPD, Afib, amxiety, MI, schizophrenia and alcohol abuse who presented to the ED with a c/o  chest pain for a week with N/V/SOB and cough.  [place]  friends when he got here but the house was vacant when he arrived.Has lived in motel until he ran out of money now homeless.His  medications and glucometer were lost during his transit to [place].  Unfortunately, due to financial limitations, his diet and food options will be limited on discharge.  At this time, his blood glucose is in goal of 100-180.       Update: BG trending down within target range today after basal insulin increased yesterday.  Adding in mealtime bolus insulin would assist with post prandial hyperglycemia -     Recommendations/ Discharge Planning     Recommend:   1. Agree with advancement to 15 units NPH BID   Please give additional 5 units NPH x1  now as he received 10 units NPH this morning.      2. Adjust correctional insulin to resistance scale ACHS      3.  Add moderate dose mealtime Humalog: 8 units Humalog with meals as patient experiencing pre-prandial hyperglycemia.     Hold if patient consumes less than 50% of carbohydrates on meal tray         On Discharge:   Given his A1C was 9.1% and cost and resources are a supply concern.     -It would be recommended to resume his PTA metformin 1000 mg daily.     -Would add NPH 15 units BID   -Continue Lispro with meals with a dose adjustment: 10 units with meals   -Will need to establish a PCP- per case management recommendations    -Will need a new glucometer kit:  Check glucose twice daily (at breakfast and dinner)           Billing Code(s)        Thank you for including [place] in their care.  I spent 15 minutes in direct patient care today for this patient.  Time includes chart review, face to face with patient and collaboration with interdisciplinary care team.         Florian Buff, CNS   Access via [employer] & (C) [phone]

## 2019-04-16 LAB — POCT GLUCOSE
POC Glucose: 184 mg/dL — ABNORMAL HIGH (ref 65–100)
POC Glucose: 156 mg/dL — ABNORMAL HIGH (ref 65–100)
POC Glucose: 185 mg/dL — ABNORMAL HIGH (ref 65–100)
POC Glucose: 159 mg/dL — ABNORMAL HIGH (ref 65–100)

## 2019-04-16 LAB — GLUCOSE, POC
Glucose (POC): 184 mg/dL — ABNORMAL HIGH (ref 65–100)
Glucose (POC): 156 mg/dL — ABNORMAL HIGH (ref 65–100)
Glucose (POC): 185 mg/dL — ABNORMAL HIGH (ref 65–100)
Glucose (POC): 159 mg/dL — ABNORMAL HIGH (ref 65–100)

## 2019-04-16 MED ORDER — INSULIN NPH HUMAN RECOMB 100 UNIT/ML INJECTION
100 unit/mL | Freq: Two times a day (BID) | SUBCUTANEOUS | Status: DC
Start: 2019-04-16 — End: 2019-04-18
  Administered 2019-04-17 – 2019-04-18 (×4): via SUBCUTANEOUS

## 2019-04-16 MED FILL — LORAZEPAM 0.5 MG TAB: 0.5 mg | ORAL | Qty: 1

## 2019-04-16 MED FILL — LISINOPRIL 20 MG TAB: 20 mg | ORAL | Qty: 1

## 2019-04-16 MED FILL — HEPARIN (PORCINE) 5,000 UNIT/ML IJ SOLN: 5000 unit/mL | INTRAMUSCULAR | Qty: 1

## 2019-04-16 MED FILL — CHILDREN'S ASPIRIN 81 MG CHEWABLE TABLET: 81 mg | ORAL | Qty: 1

## 2019-04-16 MED FILL — INSULIN LISPRO 100 UNIT/ML INJECTION: 100 unit/mL | SUBCUTANEOUS | Qty: 1

## 2019-04-16 MED FILL — FOLIC ACID 1 MG TAB: 1 mg | ORAL | Qty: 1

## 2019-04-16 MED FILL — INSULIN NPH HUMAN RECOMB 100 UNIT/ML INJECTION: 100 unit/mL | SUBCUTANEOUS | Qty: 1

## 2019-04-16 MED FILL — OXYCODONE-ACETAMINOPHEN 5 MG-325 MG TAB: 5-325 mg | ORAL | Qty: 1

## 2019-04-16 MED FILL — THIAMINE HCL 100 MG TAB: 100 mg | ORAL | Qty: 1

## 2019-04-16 MED FILL — ATORVASTATIN 40 MG TAB: 40 mg | ORAL | Qty: 1

## 2019-04-16 MED FILL — NORMAL SALINE FLUSH 0.9 % INJECTION SYRINGE: INTRAMUSCULAR | Qty: 10

## 2019-04-16 MED FILL — FAMOTIDINE 20 MG TAB: 20 mg | ORAL | Qty: 1

## 2019-04-16 MED FILL — THERAPEUTIC MULTIVITAMIN TAB: ORAL | Qty: 1

## 2019-04-16 MED FILL — NICOTINE 14 MG/24 HR DAILY PATCH: 14 mg/24 hr | TRANSDERMAL | Qty: 1

## 2019-04-16 MED FILL — SENEXON-S 8.6 MG-50 MG TABLET: ORAL | Qty: 1

## 2019-04-16 NOTE — Progress Notes (Signed)
Bedside and Verbal shift change report given to Morgan, RN (oncoming nurse) by Malik, RN (offgoing nurse). Report included the following information SBAR, Kardex, MAR and Recent Results.

## 2019-04-16 NOTE — Other (Signed)
[facility]  CLINICAL NURSE SPECIALIST CONSULT  PROGRAM FOR DIABETES HEALTH    FOLLOW-UP NOTE    Presentation   Derek Mclaughlin is a 68 y.o. male with a PMH of DM, [name], HTN, [name] s/p stent placement, COPD, Afib, amxiety, MI, schizophrenia and alcohol abuse who presented to the ED with a c/o chest pain for a week with N/V/SOB and cough.  He is visiting from??[place]and??states he was supposed to living with friends when he got here but the house was vacant when he arrived.??Has lived in [place] until he ran out of money now homeless.??He has been out of medications??for about 1 week prior to coming to ED. Also verbalized he was experiencing auditory and visual hallucinations the past two weeks.  In the ED he was admitted for a cardiac work-up.    Subjective   He is easily awakened, but states he did not sleep well last evening, so wanted to continue to rest.   Case Management assisting with referrals to inpatient ETOH facilities after discharge  .   Objective   Physical exam  General Alert, oriented and in no acute distress/ill-appearing. Conversant and cooperative.  Vital Signs   Visit Vitals  BP 102/61 (BP 1 Location: Right arm, BP Patient Position: Lying right side;At rest)   Pulse 72   Temp 98.6 ??F (37 ??C)   Resp 16   Ht _0  (1.778 m)   Wt 116.9 kg (257 lb 11.2 oz)   SpO2 96%   BMI 36.98 kg/m??     Skin  Warm and dry  Heart   Regular rate and rhythm. No murmurs, rubs or gallops  Lungs  Clear to auscultation without rales or rhonchi  Extremities No foot wounds    Laboratory  Lab Results   Component Value Date/Time    Hemoglobin A1c 9.1 (H) 04/07/2019 04:49 PM     Lab Results   Component Value Date/Time    LDL, calculated 123.2 (H) 04/08/2019 12:29 AM     Lab Results   Component Value Date/Time    Creatinine 1.17 04/12/2019 01:54 AM     Lab Results   Component Value Date/Time    Sodium 135 (L) 04/12/2019 01:54 AM    Potassium 4.1 04/12/2019 01:54 AM    Chloride 105 04/12/2019 01:54 AM    CO2 24 04/12/2019 01:54 AM     Anion gap 6 04/12/2019 01:54 AM    Glucose 196 (H) 04/12/2019 01:54 AM    BUN 26 (H) 04/12/2019 01:54 AM    Creatinine 1.17 04/12/2019 01:54 AM    BUN/Creatinine ratio 22 (H) 04/12/2019 01:54 AM    GFR est AA >60 04/12/2019 01:54 AM    GFR est non-AA >60 04/12/2019 01:54 AM    Calcium 8.8 04/12/2019 01:54 AM    Bilirubin, total 0.2 04/12/2019 01:54 AM    Alk. phosphatase 74 04/12/2019 01:54 AM    Protein, total 6.6 04/12/2019 01:54 AM    Albumin 2.8 (L) 04/12/2019 01:54 AM    Globulin 3.8 04/12/2019 01:54 AM    A-G Ratio 0.7 (L) 04/12/2019 01:54 AM    ALT (SGPT) 32 04/12/2019 01:54 AM     Lab Results   Component Value Date/Time    ALT (SGPT) 32 04/12/2019 01:54 AM       Blood glucose pattern        Assessment and Plan   Nursing Diagnosis Risk for unstable blood glucose pattern   Nursing Intervention Domain 5250 Decision-making Support   Nursing Interventions  Examined current inpatient diabetes control   Explored factors facilitating and impeding inpatient management     Evaluation   Derek Mclaughlin is a 68 y.o. male with a PMH of DM, [name], HTN, [name] s/p stent placement, COPD, Afib, amxiety, MI, schizophrenia and alcohol abuse who presented to the ED with a c/o chest pain for a week with N/V/SOB and cough.  He is visiting from??[place]and??states he was supposed to living with friends when he got here but the house was vacant when he arrived.??Has lived in [place] until he ran out of money now homeless.??His medications and glucometer were lost during his transit to [place].  Unfortunately, due to financial limitations, his diet and food options will be limited on discharge.  At this time, his blood glucose is in goal of 100-180.     Update: BG trends normalizing since yesterday.  BG trends yesterday 146-172m/dl. Required 8units of correctional insulin yesterday.  Since lowest BG in 140s, and AM fasting BG 156 today, he could tolerate a small increase in his basal dose.  Spoke with CM re: [place]- need to ensure they will be able to administer his insulin and check his BG since he does not have a glucometer at this time.  Recommendations/ Discharge Planning   Recommend:  1. INCREASE basal NPH to 18units BID    2. Adjust correctional insulin to resistance scale ACHS    On Discharge:  Given his A1C was 9.1% and cost and resources are a supply concern.    -It would be recommended to resume his PTA metformin 1000 mg daily.    -Would add NPH 18 units BID (if tolerates increase)  -Will need to establish a PCP- per case management recommendations   -Will need script for a new glucometer and supplies:  Check glucose twice daily (at breakfast and dinner)      Billing Code(s)     Thank you for including [place] their care.  I spent 15 minutes in direct patient care today for this patient.  Time includes chart review, face to face with patient and collaboration with interdisciplinary care team.      MLily Lovings CNS  Access via [place](C) [phone]

## 2019-04-16 NOTE — Progress Notes (Signed)
[name]                                                                                          Hospitalist Progress Note  Derek Beagle, MD  Answering service: [phone] OR [phone] from in house phone        Date of Service:  04/16/2019  NAME:  Derek Mclaughlin  DOB:  12/27/1950  MRN:  [id number]      Admission Summary:   Derek Mclaughlin??is a 68 y.o.??male??who presents with PMH of DM, [name], HTN, [name], COPD here with c/o chest pain??x1 week and worsening??since last night??with associated??N/V and also having shortness of breath and cough.??He describes pain as??tight??heaviness to left side of chest??and radiates up to neck??and jaw. ??He is visiting from??[place]??and??states he was supposed to living with friends when he got here but the house was vacant when he arrived.??Has lived in [name] until he ran out of money now homeless.??He has been out of medications??for about 1 week prior to coming to ED. He??states he has a history of alcohol dependence and??restarted drinking alcohol about 2 weeks??prior to coming to ED??and drinks 12 beers/day. He??states he has a hx of bipolar and schizophrenia and has had an increase in symptoms of depression over last few months, he stated??he doesn't care if he lives but denies suicidal and homicidal ideations.He??states he is??having??auditory and visual??hallucinations??x2 weeks where he hears his deceased wifes voice and thinks he has seen her in a room with him.??  Work up in ED: negative CXR, EKG, cardiology consult, ECHO ordered, labs reviewed, psych consult, r/o COVID. 04/07/2019  ??    Interval history / Subjective:     PRN oral d/w CM ready for d/c  awaiting for placement   No overnight issues BS high will review insulin discussed with case manager in length   He is accepted in few places see case manager note but they will take some time to physically get him admitted to those places also the requested to  discontinue all IV medication for him and if possible to wean him off from all benzos       Assessment & Plan:     Atypical Chest Pain:??resolved   - likely due to anxiety  -HCW:CBJSEG sinus rhythm,??Inferior infarct, age undetermined,??RBBB  -troponin negative x 3 negative  -ECHO: EF 55 - 60%. No regional wall motion abnormality noted. Mild (grade 1) left ventricular diastolic dysfunction.  -cardiology on board  - cardiac cath 6/9: Normal LVEDP.   ??  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 Takes Xanax 2mg  tid last filled in May 20th 90 pills but patientstates he lost his medication while travelling here. Last dose was on Thursday 04/03/2019  - IV ativan prn   ??  Severe anxiety/ Panic attacks   C/w Hallucinations: hx of Bipolar and schizophrenia.??  - Auditory and visual hallucinations of deceased wife.??  -denies HI and SI, not on any mood medications except for Xanax  -seen by psyche , recommendation noted but pt does not want those medications as he thinks that makes him weak   -  hydroxyzine prn   ??  DM type II with hyperglycemia  - SSI   - HGA1c 9  - started on NPH 18 U bid. C/w ISS  ??  [name]:   -lipid panel LDL 123  - started on statin  ????  HTN: stable  - not on any meds per pharmacy  - started on lisinopril 20mg  daily    -prn hydralazine  ??  COPD not in exacerbation  -CXR negative  -guaifenesin as needed for cough, albuterol inhaler as needed  - COVID 19 negative ??  - recommend pulmonology and PFT eval as outpt   ??  ETOH Dependence:   - drinks 12 beers/day.  -CIWA protocol now out of window for withdrawl  ??  Tobacco Dependence: current smoker 2ppd-1/2ppd.  -not interested in quitting, counseled for cessation  -nicotine patch  ??  Fall 6/10  - CT head: No significant intracranial abnormalities  - CT neck: Avulsion fracture of the tip of the T1 spinous process, probably chronic  - NSGY consult placed   ??  Homelessness  -case management consult for resources    Code status: Full  DVT prophylaxis: SCD     Care Plan discussed with: Patient/Family and Nurse  Anticipated Disposition: Home w/Family  Anticipated Discharge: TBD medically stable for d/c     Hospital Problems  Date Reviewed: 04/07/2019          Codes Class Noted POA    Schizophrenia (HCC) ICD-10-CM: F20.9  ICD-9-CM: 295.90  04/09/2019 Yes        Bipolar 1 disorder, depressed, severe (HCC) ICD-10-CM: F31.4  ICD-9-CM: 296.53  04/09/2019 Yes        Tobacco abuse ICD-10-CM: Z72.0  ICD-9-CM: 305.1  04/09/2019 Yes        Alcohol abuse ICD-10-CM: F10.10  ICD-9-CM: 305.00  04/09/2019 Yes        Chest pain ICD-10-CM: R07.9  ICD-9-CM: 786.50  04/07/2019 Yes                Review of Systems:   A comprehensive review of systems was negative.       Vital Signs:    Last 24hrs VS reviewed since prior progress note. Most recent are:  Visit Vitals  BP 102/61 (BP 1 Location: Right arm, BP Patient Position: Lying right side;At rest)   Pulse 72   Temp 98.6 ??F (37 ??C)   Resp 16   Ht 5\' 10"  (1.778 m)   Wt 116.9 kg (257 lb 11.2 oz)   SpO2 96%   BMI 36.98 kg/m??       No intake or output data in the 24 hours ending 04/16/19 1304     Physical Examination:             Constitutional:  No acute distress, cooperative, pleasant    ENT:  Oral mucosa moist, oropharynx benign.    Resp:  CTA bilaterally. No wheezing/rhonchi/rales. No accessory muscle use   CV:  Regular rhythm, normal rate, no murmurs, gallops, rubs    GI:  Soft, non distended, non tender. normoactive bowel sounds, no hepatosplenomegaly     Musculoskeletal:  No edema, warm, 2+ pulses throughout    Neurologic:  Moves all extremities.  AAOx3, CN II-XII reviewed     Psych:  Good insight, positive anxious nor agitated.       Data Review:    I personally reviewed  Image and Lbs      Labs:     No results for input(s):  WBC, HGB, HCT, PLT, HGBEXT, HCTEXT, PLTEXT, HGBEXT, HCTEXT, PLTEXT in the last 72 hours.  No results for input(s): NA, K, CL, CO2, BUN, CREA, GLU, CA, MG, PHOS, URICA in the last 72 hours.   No results for input(s): ALT, AP, TBIL, TBILI, TP, ALB, GLOB, GGT, AML, LPSE in the last 72 hours.    No lab exists for component: SGOT, GPT, AMYP, HLPSE  Recent Labs     04/15/19  0015 04/14/19  0341   INR 1.0 1.0   PTP 10.1 10.3   APTT 26.0 26.9      No results for input(s): FE, TIBC, PSAT, FERR in the last 72 hours.   No results found for: FOL, RBCF   No results for input(s): PH, PCO2, PO2 in the last 72 hours.  No results for input(s): CPK, CKNDX, TROIQ in the last 72 hours.    No lab exists for component: CPKMB  Lab Results   Component Value Date/Time    Cholesterol, total 201 (H) 04/08/2019 12:29 AM    HDL Cholesterol 36 04/08/2019 12:29 AM    LDL, calculated 123.2 (H) 16/10/960406/07/2019 12:29 AM    Triglyceride 209 (H) 04/08/2019 12:29 AM    CHOL/HDL Ratio 5.6 (H) 04/08/2019 12:29 AM     Lab Results   Component Value Date/Time    Glucose (POC) 185 (H) 04/16/2019 11:37 AM    Glucose (POC) 156 (H) 04/16/2019 06:09 AM    Glucose (POC) 184 (H) 04/15/2019 09:10 PM    Glucose (POC) 173 (H) 04/15/2019 04:06 PM    Glucose (POC) 158 (H) 04/15/2019 11:07 AM     Lab Results   Component Value Date/Time    Color YELLOW/STRAW 04/08/2019 02:07 AM    Appearance CLEAR 04/08/2019 02:07 AM    Specific gravity 1.019 04/08/2019 02:07 AM    pH (UA) 5.0 04/08/2019 02:07 AM    Protein 100 (A) 04/08/2019 02:07 AM    Glucose Negative 04/08/2019 02:07 AM    Ketone Negative 04/08/2019 02:07 AM    Bilirubin Negative 04/08/2019 02:07 AM    Urobilinogen 0.2 04/08/2019 02:07 AM    Nitrites Negative 04/08/2019 02:07 AM    Leukocyte Esterase Negative 04/08/2019 02:07 AM    Epithelial cells FEW 04/08/2019 02:07 AM    Bacteria Negative 04/08/2019 02:07 AM    WBC 0-4 04/08/2019 02:07 AM    RBC 50-100 04/08/2019 02:07 AM         Medications Reviewed:     Current Facility-Administered Medications   Medication Dose Route Frequency   ??? insulin NPH (NOVOLIN N, HUMULIN N) injection 18 Units  18 Units SubCUTAneous ACB/HS    ??? LORazepam (ATIVAN) tablet 0.5 mg  0.5 mg Oral Q6H PRN   ??? senna-docusate (PERICOLACE) 8.6-50 mg per tablet 1 Tab  1 Tab Oral DAILY   ??? oxyCODONE-acetaminophen (PERCOCET) 5-325 mg per tablet 1 Tab  1 Tab Oral Q8H PRN   ??? thiamine HCL (B-1) tablet 100 mg  100 mg Oral DAILY   ??? folic acid (FOLVITE) tablet 1 mg  1 mg Oral DAILY   ??? therapeutic multivitamin (THERAGRAN) tablet 1 Tab  1 Tab Oral DAILY   ??? lisinopriL (PRINIVIL, ZESTRIL) tablet 20 mg  20 mg Oral DAILY   ??? sodium chloride (NS) flush 5-40 mL  5-40 mL IntraVENous Q8H   ??? sodium chloride (NS) flush 5-40 mL  5-40 mL IntraVENous PRN   ??? famotidine (PEPCID) tablet 20 mg  20 mg Oral BID   ???  hydrOXYzine HCL (ATARAX) tablet 25 mg  25 mg Oral TID PRN   ??? atorvastatin (LIPITOR) tablet 40 mg  40 mg Oral QHS   ??? sodium chloride (NS) flush 5-40 mL  5-40 mL IntraVENous Q8H   ??? sodium chloride (NS) flush 5-40 mL  5-40 mL IntraVENous PRN   ??? heparin (porcine) injection 5,000 Units  5,000 Units SubCUTAneous Q8H   ??? glucose chewable tablet 16 g  4 Tab Oral PRN   ??? glucagon (GLUCAGEN) injection 1 mg  1 mg IntraMUSCular PRN   ??? insulin lispro (HUMALOG) injection   SubCUTAneous AC&HS   ??? acetaminophen (TYLENOL) tablet 650 mg  650 mg Oral Q4H PRN   ??? hydrALAZINE (APRESOLINE) 20 mg/mL injection 20 mg  20 mg IntraVENous Q6H PRN   ??? guaiFENesin (ROBITUSSIN) 100 mg/5 mL oral liquid 100 mg  100 mg Oral Q4H PRN   ??? ondansetron (ZOFRAN) injection 4 mg  4 mg IntraVENous Q4H PRN   ??? nicotine (NICODERM CQ) 14 mg/24 hr patch 1 Patch  1 Patch TransDERmal DAILY   ??? albuterol (PROVENTIL HFA, VENTOLIN HFA, PROAIR HFA) inhaler 2 Puff  2 Puff Inhalation Q4H PRN   ??? aspirin chewable tablet 81 mg  81 mg Oral DAILY     ______________________________________________________________________  EXPECTED LENGTH OF STAY: 2d 14h  ACTUAL LENGTH OF STAY:          8                 Lucky RathkeMahmud Kezia Benevides, MD

## 2019-04-16 NOTE — Progress Notes (Signed)
Bedside shift change report given to Malik, RN (oncoming nurse) by Morgan, RN (offgoing nurse). Report included the following information SBAR, Kardex, Intake/Output and MAR.

## 2019-04-16 NOTE — Group Note (Signed)
 Diabetes Mgmt by Moises Rosaline LABOR, CNS at 04/16/19 1052                Author: Moises Rosaline LABOR, CNS  Service: Certified Clinical Nurse Specialist  Author Type: Clinical Nurse Specialist       Filed: 04/16/19 1108  Date of Service: 04/16/19 1052  Status: Signed          Editor: Moises Rosaline LABOR, CNS (Clinical Nurse Specialist)               [facility]   CLINICAL NURSE SPECIALIST CONSULT   PROGRAM FOR DIABETES HEALTH      FOLLOW-UP NOTE        Presentation     Derek Mclaughlin is a 68 y.o. male with a PMH of DM, [name], HTN, [name] s/p stent placement, COPD, Afib, amxiety, MI, schizophrenia and alcohol abuse who presented  to the ED with a c/o chest pain for a week with N/V/SOB and cough.  [place] he got here but the house was vacant when he arrived.Has lived in motel until he ran  out of money now homeless.He has been out of medicationsfor about 1 week prior to coming to ED. Also verbalized he was experiencing auditory and visual hallucinations the past two weeks.  In the ED he was admitted for a cardiac work-up.        Subjective     He is easily awakened, but states he did not sleep well last evening, so wanted to continue to rest.    Case Management assisting with referrals to inpatient ETOH facilities after discharge   .      Objective     Physical exam   General Alert, oriented and in no acute distress/ill-appearing. Conversant and cooperative.   Vital Signs    Visit Vitals      BP  102/61 (BP 1 Location: Right arm, BP Patient Position: Lying right side;At rest)     Pulse  72     Temp  98.6 F (37 C)     Resp  16     Ht  5' 10 (1.778 m)     Wt  116.9 kg (257 lb 11.2 oz)     SpO2  96%        BMI  36.98 kg/m        Skin  Warm and dry   Heart   Regular rate and rhythm. No murmurs, rubs or gallops   Lungs  Clear to auscultation without rales or rhonchi   Extremities No foot wounds      Laboratory     Lab Results          Component  Value  Date/Time            Hemoglobin A1c  9.1 (H)  04/07/2019 04:49 PM          Lab Results         Component  Value  Date/Time            LDL, calculated  123.2 (H)  04/08/2019 12:29 AM          Lab Results         Component  Value  Date/Time            Creatinine  1.17  04/12/2019 01:54 AM          Lab Results  Component  Value  Date/Time            Sodium  135 (L)  04/12/2019 01:54 AM       Potassium  4.1  04/12/2019 01:54 AM       Chloride  105  04/12/2019 01:54 AM       CO2  24  04/12/2019 01:54 AM       Anion gap  6  04/12/2019 01:54 AM       Glucose  196 (H)  04/12/2019 01:54 AM       BUN  26 (H)  04/12/2019 01:54 AM       Creatinine  1.17  04/12/2019 01:54 AM       BUN/Creatinine ratio  22 (H)  04/12/2019 01:54 AM       GFR est AA  >60  04/12/2019 01:54 AM       GFR est non-AA  >60  04/12/2019 01:54 AM       Calcium  8.8  04/12/2019 01:54 AM       Bilirubin, total  0.2  04/12/2019 01:54 AM       Alk. phosphatase  74  04/12/2019 01:54 AM       Protein, total  6.6  04/12/2019 01:54 AM       Albumin  2.8 (L)  04/12/2019 01:54 AM       Globulin  3.8  04/12/2019 01:54 AM       A-G Ratio  0.7 (L)  04/12/2019 01:54 AM            ALT (SGPT)  32  04/12/2019 01:54 AM          Lab Results         Component  Value  Date/Time            ALT (SGPT)  32  04/12/2019 01:54 AM           Blood glucose pattern                Assessment and Plan        Nursing Diagnosis  Risk for unstable blood glucose pattern     Nursing Intervention Domain  5250 Decision-making Support        Nursing Interventions  Examined current inpatient diabetes control    Explored factors facilitating and impeding inpatient management          Evaluation     Derek Mclaughlin is a 68 y.o. male with a PMH of DM, [name], HTN, [name] s/p stent placement, COPD, Afib, amxiety, MI, schizophrenia and alcohol abuse who presented to the ED with a c/o  chest pain for a week with N/V/SOB and cough.  [place] living with friends when he got here but the house was vacant when he arrived.Has lived in motel until he ran out of money now homeless.His  medications and glucometer were lost during his transit to [place] .  Unfortunately, due to financial limitations, his diet and food options will be limited on discharge.  At this time, his blood glucose is in goal of 100-180.       Update: BG trends normalizing since yesterday.  BG trends yesterday 146-184mg /dl. Required 8units of correctional insulin yesterday.  Since lowest BG in 140s, and AM fasting BG 156 today, he could  tolerate a small increase in his basal dose.  Spoke with CM re: his disposition to the rehab in [place]  - need to ensure  they will be able to administer his insulin and check his BG since he  does not have a glucometer at this time.     Recommendations/ Discharge Planning     Recommend:   1. INCREASE basal NPH to 18units BID      2. Adjust correctional insulin to resistance scale ACHS      On Discharge:   Given his A1C was 9.1% and cost and resources are a supply concern.     -It would be recommended to resume his PTA metformin 1000 mg daily.     -Would add NPH 18 units BID (if tolerates increase)   -Will need to establish a PCP- per case management recommendations    -Will need script for a new glucometer and supplies:  Check glucose twice daily (at breakfast and dinner)           Billing Code(s)        Thank you for including us  in their care.  I spent 15 minutes in direct patient care today for this patient.  Time includes chart review, face to face with patient and collaboration with interdisciplinary care team.         Rosaline DELENA Bangs, CNS   Access via [employer] & (C) [phone]

## 2019-04-16 NOTE — Progress Notes (Signed)
Bedside and Verbal shift change report given to Lequita Halt, Charity fundraiser (Cabin crew) by Danelle Earthly, RN (offgoing nurse). Report included the following information SBAR, Kardex, MAR and Recent Results.

## 2019-04-16 NOTE — Progress Notes (Signed)
Progress Notes by Lucky Rathke, MD at 04/16/19 1304                Author: Lucky Rathke, MD  Service: Internal Medicine  Author Type: Physician       Filed: 04/16/19 1307  Date of Service: 04/16/19 1304  Status: Signed          Editor: Lucky Rathke, MD (Physician)                    [name]                                                                                              Hospitalist Progress Note   Lucky Rathke, MD   Answering service: [phone] OR [phone] from in house phone            Date of Service:  04/16/2019   NAME:  Derek Mclaughlin   DOB:  08-Apr-1951   MRN:  [id number]           Admission Summary:        Derek Mclaughlin??is a 68 y.o.??male??who presents with PMH of DM, [name], HTN, [name], [name] ??He is visiting from??[place]??and??states he was supposed to living with friends when  he got here but the house was vacant when he arrived.??Has lived in motel until he ran out of money now homeless.??He has been out of medications??for about 1 week prior to coming to ED. He??states he has a history of alcohol dependence and??restarted  drinking alcohol about 2 weeks??prior to coming to ED??and drinks 12 beers/day. He??states he has a hx of bipolar and schizophrenia and has had an increase in symptoms of depression over last few months, he stated??he doesn't care if he lives  but denies suicidal and homicidal ideations.He??states he is??having??auditory and visual??hallucinations??x2 weeks where he hears his deceased wifes voice and thinks he has seen her in a room with him.??   Work up in ED: negative CXR, EKG, cardiology consult, ECHO ordered, labs reviewed, psych consult, r/o COVID. 04/07/2019   ??      Interval history / Subjective:           PRN oral  d/w CM ready for d/c  awaiting for placement    No overnight issues BS high will review insulin discussed with case manager in length    He is accepted in few places see case manager note but they will take some time to physically get him admitted to those places also the requested to discontinue all IV medication for him and if possible to wean him off from all benzos             Assessment & Plan:          Atypical Chest Pain:??resolved    - likely due to anxiety   -AOZ:HYQMVH sinus rhythm,??Inferior infarct, age undetermined,??RBBB   -troponin negative x  3 negative   -ECHO: EF 55 - 60%. No regional wall motion abnormality noted. Mild (grade 1) left ventricular diastolic dysfunction.   -cardiology on board   - cardiac cath 6/9: Normal LVEDP.    ??   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 Takes Xanax 2mg  tid last filled in May 20th 90 pills but patientstates he lost his medication while travelling here. Last dose was on Thursday 04/03/2019   - IV ativan prn    ??   Severe anxiety/ Panic attacks    C/w Hallucinations: hx of Bipolar and schizophrenia.??   - Auditory and visual hallucinations of deceased wife.??   -denies HI and SI, not on any mood medications except for Xanax   -seen by psyche , recommendation noted but pt does not want those medications as he thinks that makes him weak    - hydroxyzine prn    ??   DM type II with hyperglycemia   - SSI    - HGA1c 9   - started on NPH 18 U bid. C/w ISS   ??   [name]:    -lipid panel LDL 123   - started on statin   ????   HTN: stable   - not on any meds per pharmacy   - started on lisinopril 20mg  daily     -prn hydralazine   ??   COPD not in exacerbation   -CXR negative   -guaifenesin as needed for cough, albuterol inhaler as needed   - COVID 19 negative ??   - recommend pulmonology and PFT eval as outpt    ??   ETOH Dependence:    - drinks 12 beers/day.   -CIWA protocol now out of window for withdrawl   ??   Tobacco Dependence: current smoker 2ppd-1/2ppd.   -not  interested in quitting, counseled for cessation   -nicotine patch   ??   Fall 6/10   - CT head: No significant intracranial abnormalities   - CT neck: Avulsion fracture of the tip of the T1 spinous process, probably chronic   - NSGY consult placed    ??   Homelessness   -case management consult for resources      Code status: Full   DVT prophylaxis: SCD      Care Plan discussed with: Patient/Family and Nurse   Anticipated Disposition: Home w/Family   Anticipated Discharge: TBD medically stable for d/c           Hospital Problems   Date Reviewed:  04/07/2019                         Codes  Class  Noted  POA              Schizophrenia ([place])  ICD-10-CM: F20.9   ICD-9-CM: 295.90    04/09/2019  Yes                        Bipolar 1 disorder, depressed, severe ([place])  ICD-10-CM: F31.4   ICD-9-CM: 296.53    04/09/2019  Yes                        Tobacco abuse  ICD-10-CM: Z72.0   ICD-9-CM: 305.1    04/09/2019  Yes  Alcohol abuse  ICD-10-CM: F10.10   ICD-9-CM: 305.00    04/09/2019  Yes                        Chest pain  ICD-10-CM: R07.9   ICD-9-CM: 786.50    04/07/2019  Yes                               Review of Systems:     A comprehensive review of systems was negative.            Vital Signs:      Last 24hrs VS reviewed since prior progress note. Most recent are:   Visit Vitals      BP  102/61 (BP 1 Location: Right arm, BP Patient Position: Lying right side;At rest)     Pulse  72     Temp  98.6 ??F (37 ??C)     Resp  16     Ht  5\' 10"  (1.778 m)     Wt  116.9 kg (257 lb 11.2 oz)     SpO2  96%        BMI  36.98 kg/m??           No intake or output data in the 24 hours ending 04/16/19 1304         Physical Examination:                     Constitutional:   No acute distress, cooperative, pleasant      ENT:   Oral mucosa moist, oropharynx benign.      Resp:   CTA bilaterally. No wheezing/rhonchi/rales. No accessory muscle use     CV:   Regular rhythm, normal rate, no murmurs, gallops, rubs         GI:   Soft, non  distended, non tender. normoactive bowel sounds, no hepatosplenomegaly          Musculoskeletal:   No edema, warm, 2+ pulses throughout         Neurologic:   Moves all extremities.  AAOx3, CN II-XII reviewed       Psych:  Good insight, positive anxious nor agitated.               Data Review:      I personally reviewed  Image and Lbs           Labs:        No results for input(s): WBC, HGB, HCT, PLT, HGBEXT, HCTEXT, PLTEXT, HGBEXT, HCTEXT, PLTEXT in the last 72 hours.   No results for input(s): NA, K, CL, CO2, BUN, CREA, GLU, CA, MG, PHOS, URICA in the last 72 hours.   No results for input(s): ALT, AP, TBIL, TBILI, TP, ALB, GLOB, GGT, AML, LPSE in the last 72 hours.      No lab exists for component: SGOT, GPT, AMYP, HLPSE     Recent Labs            04/15/19   0015  04/14/19   0341     INR  1.0  1.0     PTP  10.1  10.3         APTT  26.0  26.9         No results for input(s): FE, TIBC, PSAT, FERR in the last 72 hours.    No results found for: FOL, RBCF  No results for input(s): PH, PCO2, PO2 in the last 72 hours.   No results for input(s): CPK, CKNDX, TROIQ in the last 72 hours.      No lab exists for component: CPKMB     Lab Results         Component  Value  Date/Time            Cholesterol, total  201 (H)  04/08/2019 12:29 AM       HDL Cholesterol  36  04/08/2019 12:29 AM       LDL, calculated  123.2 (H)  04/08/2019 12:29 AM       Triglyceride  209 (H)  04/08/2019 12:29 AM            CHOL/HDL Ratio  5.6 (H)  04/08/2019 12:29 AM          Lab Results         Component  Value  Date/Time            Glucose (POC)  185 (H)  04/16/2019 11:37 AM       Glucose (POC)  156 (H)  04/16/2019 06:09 AM       Glucose (POC)  184 (H)  04/15/2019 09:10 PM       Glucose (POC)  173 (H)  04/15/2019 04:06 PM            Glucose (POC)  158 (H)  04/15/2019 11:07 AM          Lab Results         Component  Value  Date/Time            Color  YELLOW/STRAW  04/08/2019 02:07 AM       Appearance  CLEAR  04/08/2019 02:07 AM       Specific gravity   1.019  04/08/2019 02:07 AM       pH (UA)  5.0  04/08/2019 02:07 AM       Protein  100 (A)  04/08/2019 02:07 AM       Glucose  Negative  04/08/2019 02:07 AM       Ketone  Negative  04/08/2019 02:07 AM       Bilirubin  Negative  04/08/2019 02:07 AM       Urobilinogen  0.2  04/08/2019 02:07 AM       Nitrites  Negative  04/08/2019 02:07 AM       Leukocyte Esterase  Negative  04/08/2019 02:07 AM       Epithelial cells  FEW  04/08/2019 02:07 AM       Bacteria  Negative  04/08/2019 02:07 AM       WBC  0-4  04/08/2019 02:07 AM            RBC  50-100  04/08/2019 02:07 AM                Medications Reviewed:          Current Facility-Administered Medications          Medication  Dose  Route  Frequency           ?  insulin NPH (NOVOLIN N, HUMULIN N) injection 18 Units   18 Units  SubCUTAneous  ACB/HS     ?  LORazepam (ATIVAN) tablet 0.5 mg   0.5 mg  Oral  Q6H PRN     ?  senna-docusate (PERICOLACE) 8.6-50 mg per tablet 1 Tab   1 Tab  Oral  DAILY     ?  oxyCODONE-acetaminophen (PERCOCET) 5-325 mg per tablet 1 Tab   1 Tab  Oral  Q8H PRN     ?  thiamine HCL (B-1) tablet 100 mg   100 mg  Oral  DAILY     ?  folic acid (FOLVITE) tablet 1 mg   1 mg  Oral  DAILY           ?  therapeutic multivitamin (THERAGRAN) tablet 1 Tab   1 Tab  Oral  DAILY           ?  lisinopriL (PRINIVIL, ZESTRIL) tablet 20 mg   20 mg  Oral  DAILY     ?  sodium chloride (NS) flush 5-40 mL   5-40 mL  IntraVENous  Q8H     ?  sodium chloride (NS) flush 5-40 mL   5-40 mL  IntraVENous  PRN     ?  famotidine (PEPCID) tablet 20 mg   20 mg  Oral  BID     ?  hydrOXYzine HCL (ATARAX) tablet 25 mg   25 mg  Oral  TID PRN     ?  atorvastatin (LIPITOR) tablet 40 mg   40 mg  Oral  QHS     ?  sodium chloride (NS) flush 5-40 mL   5-40 mL  IntraVENous  Q8H     ?  sodium chloride (NS) flush 5-40 mL   5-40 mL  IntraVENous  PRN     ?  heparin (porcine) injection 5,000 Units   5,000 Units  SubCUTAneous  Q8H     ?  glucose chewable tablet 16 g   4 Tab  Oral  PRN     ?  glucagon  (GLUCAGEN) injection 1 mg   1 mg  IntraMUSCular  PRN     ?  insulin lispro (HUMALOG) injection     SubCUTAneous  AC&HS     ?  acetaminophen (TYLENOL) tablet 650 mg   650 mg  Oral  Q4H PRN     ?  hydrALAZINE (APRESOLINE) 20 mg/mL injection 20 mg   20 mg  IntraVENous  Q6H PRN     ?  guaiFENesin (ROBITUSSIN) 100 mg/5 mL oral liquid 100 mg   100 mg  Oral  Q4H PRN     ?  ondansetron (ZOFRAN) injection 4 mg   4 mg  IntraVENous  Q4H PRN     ?  nicotine (NICODERM CQ) 14 mg/24 hr patch 1 Patch   1 Patch  TransDERmal  DAILY     ?  albuterol (PROVENTIL HFA, VENTOLIN HFA, PROAIR HFA) inhaler 2 Puff   2 Puff  Inhalation  Q4H PRN           ?  aspirin chewable tablet 81 mg   81 mg  Oral  DAILY        ______________________________________________________________________   EXPECTED LENGTH OF STAY: 2d 14h   ACTUAL LENGTH OF STAY:          8                    Lucky RathkeMahmud Keerstin Bjelland, MD

## 2019-04-17 LAB — POCT GLUCOSE
POC Glucose: 183 mg/dL — ABNORMAL HIGH (ref 65–100)
POC Glucose: 210 mg/dL — ABNORMAL HIGH (ref 65–100)
POC Glucose: 135 mg/dL — ABNORMAL HIGH (ref 65–100)
POC Glucose: 162 mg/dL — ABNORMAL HIGH (ref 65–100)

## 2019-04-17 LAB — GLUCOSE, POC
Glucose (POC): 183 mg/dL — ABNORMAL HIGH (ref 65–100)
Glucose (POC): 210 mg/dL — ABNORMAL HIGH (ref 65–100)
Glucose (POC): 135 mg/dL — ABNORMAL HIGH (ref 65–100)
Glucose (POC): 162 mg/dL — ABNORMAL HIGH (ref 65–100)

## 2019-04-17 MED ORDER — ALPRAZOLAM 0.25 MG TAB
0.25 mg | Freq: Two times a day (BID) | ORAL | Status: DC
Start: 2019-04-17 — End: 2019-04-17

## 2019-04-17 MED ORDER — ALPRAZOLAM 0.25 MG TAB
0.25 mg | Freq: Two times a day (BID) | ORAL | Status: DC
Start: 2019-04-17 — End: 2019-04-18
  Administered 2019-04-17 – 2019-04-18 (×2): via ORAL

## 2019-04-17 MED FILL — LORAZEPAM 0.5 MG TAB: 0.5 mg | ORAL | Qty: 1

## 2019-04-17 MED FILL — NORMAL SALINE FLUSH 0.9 % INJECTION SYRINGE: INTRAMUSCULAR | Qty: 10

## 2019-04-17 MED FILL — INSULIN LISPRO 100 UNIT/ML INJECTION: 100 unit/mL | SUBCUTANEOUS | Qty: 1

## 2019-04-17 MED FILL — ACETAMINOPHEN 325 MG TABLET: 325 mg | ORAL | Qty: 2

## 2019-04-17 MED FILL — HEPARIN (PORCINE) 5,000 UNIT/ML IJ SOLN: 5000 unit/mL | INTRAMUSCULAR | Qty: 1

## 2019-04-17 MED FILL — OXYCODONE-ACETAMINOPHEN 5 MG-325 MG TAB: 5-325 mg | ORAL | Qty: 1

## 2019-04-17 MED FILL — FOLIC ACID 1 MG TAB: 1 mg | ORAL | Qty: 1

## 2019-04-17 MED FILL — THIAMINE HCL 100 MG TAB: 100 mg | ORAL | Qty: 1

## 2019-04-17 MED FILL — CHILDREN'S ASPIRIN 81 MG CHEWABLE TABLET: 81 mg | ORAL | Qty: 1

## 2019-04-17 MED FILL — INSULIN NPH HUMAN RECOMB 100 UNIT/ML INJECTION: 100 unit/mL | SUBCUTANEOUS | Qty: 1

## 2019-04-17 MED FILL — ALPRAZOLAM 0.25 MG TAB: 0.25 mg | ORAL | Qty: 2

## 2019-04-17 MED FILL — THERAPEUTIC MULTIVITAMIN TAB: ORAL | Qty: 1

## 2019-04-17 MED FILL — ATORVASTATIN 40 MG TAB: 40 mg | ORAL | Qty: 1

## 2019-04-17 MED FILL — SENEXON-S 8.6 MG-50 MG TABLET: ORAL | Qty: 1

## 2019-04-17 MED FILL — FAMOTIDINE 20 MG TAB: 20 mg | ORAL | Qty: 1

## 2019-04-17 MED FILL — NICOTINE 14 MG/24 HR DAILY PATCH: 14 mg/24 hr | TRANSDERMAL | Qty: 1

## 2019-04-17 MED FILL — LISINOPRIL 20 MG TAB: 20 mg | ORAL | Qty: 1

## 2019-04-17 NOTE — Other (Signed)
Bedside shift change report given to Morgan RN (oncoming nurse) by Lenny RN (offgoing nurse). Report included the following information SBAR, Kardex, Intake/Output and MAR.

## 2019-04-17 NOTE — Progress Notes (Signed)
Bedside shift change report given to Lenny, RN (oncoming nurse) by Morgan, RN (offgoing nurse). Report included the following information SBAR, Kardex and MAR.

## 2019-04-17 NOTE — Progress Notes (Signed)
Physical Therapy      Chart reviewed and cleared by RN. Two attempts to work with pt this AM. Initially pt reports episode of vomiting and requesting to come back in an hour. Returned later and pt continues to decline. Will defer for today. Pt can be up with staff members to ambulate in hallway.    Thank You,  Rebekah Rubin, PT, DPT

## 2019-04-17 NOTE — Other (Signed)
[facility]  CLINICAL NURSE SPECIALIST CONSULT  PROGRAM FOR DIABETES HEALTH    FOLLOW-UP NOTE    Presentation   Brandyn Lowrey is a 68 y.o. male with a PMH of DM, [name], HTN, [name] s/p stent placement, COPD, Afib, amxiety, MI, schizophrenia and alcohol abuse who presented to the ED with a c/o chest pain for a week with N/V/SOB and cough.  He is visiting from??[place]??and??states he was supposed to living with friends when he got here but the house was vacant when he arrived.??Has lived in [place] until he ran out of money now homeless.??He has been out of medications??for about 1 week prior to coming to ED. Also verbalized he was experiencing auditory and visual hallucinations the past two weeks.  In the ED he was admitted for a cardiac work-up.    Subjective     Case Management assisting with referrals to inpatient ETOH facilities after discharge  .   Objective   Physical exam  General Alert, oriented and in no acute distress/ill-appearing. Conversant and cooperative.  Vital Signs   Visit Vitals  BP 114/60 (BP 1 Location: Left arm)   Pulse 86   Temp 98.3 ??F (36.8 ??C)   Resp 16   Ht '5\' 10"'$  (1.778 m)   Wt 116.9 kg (257 lb 11.2 oz)   SpO2 97%   BMI 36.98 kg/m??     Skin  Warm and dry  Heart   Regular rate and rhythm. No murmurs, rubs or gallops  Lungs  Clear to auscultation without rales or rhonchi  Extremities No foot wounds    Laboratory  Lab Results   Component Value Date/Time    Hemoglobin A1c 9.1 (H) 04/07/2019 04:49 PM     Lab Results   Component Value Date/Time    LDL, calculated 123.2 (H) 04/08/2019 12:29 AM     Lab Results   Component Value Date/Time    Creatinine 1.17 04/12/2019 01:54 AM     Lab Results   Component Value Date/Time    Sodium 135 (L) 04/12/2019 01:54 AM    Potassium 4.1 04/12/2019 01:54 AM    Chloride 105 04/12/2019 01:54 AM    CO2 24 04/12/2019 01:54 AM    Anion gap 6 04/12/2019 01:54 AM    Glucose 196 (H) 04/12/2019 01:54 AM    BUN 26 (H) 04/12/2019 01:54 AM    Creatinine 1.17 04/12/2019 01:54 AM     BUN/Creatinine ratio 22 (H) 04/12/2019 01:54 AM    GFR est AA >60 04/12/2019 01:54 AM    GFR est non-AA >60 04/12/2019 01:54 AM    Calcium 8.8 04/12/2019 01:54 AM    Bilirubin, total 0.2 04/12/2019 01:54 AM    Alk. phosphatase 74 04/12/2019 01:54 AM    Protein, total 6.6 04/12/2019 01:54 AM    Albumin 2.8 (L) 04/12/2019 01:54 AM    Globulin 3.8 04/12/2019 01:54 AM    A-G Ratio 0.7 (L) 04/12/2019 01:54 AM    ALT (SGPT) 32 04/12/2019 01:54 AM     Lab Results   Component Value Date/Time    ALT (SGPT) 32 04/12/2019 01:54 AM       Blood glucose pattern        Assessment and Plan   Nursing Diagnosis Risk for unstable blood glucose pattern   Nursing Intervention Domain 5250 Decision-making Support   Nursing Interventions Examined current inpatient diabetes control   Explored factors facilitating and impeding inpatient management     Evaluation   Torell Minder is a 68  y.o. male with a PMH of DM, [name], HTN, [name] s/p stent placement, COPD, Afib, amxiety, MI, schizophrenia and alcohol abuse who presented to the ED with a c/o chest pain for a week with N/V/SOB and cough.  He is visiting from??[place]??and??states he was supposed to living with friends when he got here but the house was vacant when he arrived.??Has lived in [place] until he ran out of money now homeless.??His medications and glucometer were lost during his transit to [place].  Unfortunately, due to financial limitations, his diet and food options will be limited on discharge.  At this time, his blood glucose is in goal of 100-180.     Update:   BG trends yesterday 156-'185mg'$ /dl. Required 8units of correctional insulin yesterday.  AM BG today 210.  Received 18 units NPH last evening.   Spoke with CM re: his disposition to the rehab in [place] - need to ensure they will be able to administer his insulin and check his BG since he does not have a glucometer at this time.  Recommendations/ Discharge Planning   Recommend:  1.CONTINUE  basal NPH to 18units BID     2. Adjust correctional insulin to resistance scale ACHS    On Discharge:  Given his A1C was 9.1% and cost and resources are a supply concern.    -It would be recommended to resume his PTA metformin 1000 mg daily.    -Would add NPH 18 units BID (if tolerates increase)  -Will need to establish a PCP- per case management recommendations   -Will need script for a new glucometer and supplies:  Check glucose twice daily (at breakfast and dinner)      Billing Code(s)     Thank you for including [place] in their care.  I spent 15 minutes in direct patient care today for this patient.  Time includes chart review, face to face with patient and collaboration with interdisciplinary care team.      Lily Lovings, CNS  Access via [place] (C) [phone]

## 2019-04-17 NOTE — Progress Notes (Addendum)
Hospital follow-up new PCP transitional care appointment has been scheduled with Dr. Rishi Bala for Wednesday, 04/23/19 at 11:15 a.m.  Pending patient discharge.  Gladys Randolph, Care Management Specialist.    Specialty appointment has been scheduled with Dr. Navin Gupta for Friday, 04/25/19 at 1:40 p.m.  Pending patient discharge.  Gladys Randolph, Care Management Specialist.

## 2019-04-17 NOTE — Progress Notes (Signed)
Patient stated his back was very itchy. I rubbed lotion on and patient said it felt better. Small scratches seen from where patient was scratching.

## 2019-04-17 NOTE — Progress Notes (Signed)
When passing morning medications the patient informed me that he threw up around 0300. He stated it was just a small amount. I asked him if he felt ok now and he said yes, its just his nerves. I asked him why he did not call for me and tell me and he said he didn't want to bother anyone because it was just his nerves. I informed him to always call for the nurse if something like that happens again.

## 2019-04-17 NOTE — Progress Notes (Signed)
TOC:    Patient is ready for discharge.    CM spoke to Andre Young at [facility] several times today and faxed over all requested information.  Follow up appointments are being made for PCP and Cardiology.  CM unable to make appointment with [facility],as they are closed until Monday.  Per Andre, that will not hold up decision.    COVID test pending.  If negative it is possible that [facility] can accept tomorrow.    Kristi Nelligan, RN/CRM

## 2019-04-17 NOTE — Progress Notes (Signed)
[name]                                                                                          Hospitalist Progress Note  Derek SalvageJessica I Mirakov Cohen, MD  Answering service: [phone] from in house phone        Date of Service:    NAME:  Derek PummelRobert Mclaughlin  DOB:  1951-07-03  MRN:  [id number]      Admission Summary:   Derek MaduroRobert Mcleish??is a 68 y.o.??male??who presents with PMH of DM, [name], HTN, [name], COPD here with c/o chest pain??x1 week and worsening??since last night??with associated??N/V and also having shortness of breath and cough.??He describes pain as??tight??heaviness to left side of chest??and radiates up to neck??and jaw. ??He is visiting from??[place]??and??states he was supposed to living with friends when he got here but the house was vacant when he arrived.??Has lived in motel until he ran out of money now homeless.??He has been out of medications??for about 1 week prior to coming to ED. He??states he has a history of alcohol dependence and??restarted drinking alcohol about 2 weeks??prior to coming to ED??and drinks 12 beers/day. He??states he has a hx of bipolar and schizophrenia and has had an increase in symptoms of depression over last few months, he stated??he doesn't care if he lives but denies suicidal and homicidal ideations.He??states he is??having??auditory and visual??hallucinations??x2 weeks where he hears his deceased wifes voice and thinks he has seen her in a room with him.??  Work up in ED: negative CXR, EKG, cardiology consult, ECHO ordered, labs reviewed, psych consult, r/o COVID. 04/07/2019  ??    Interval history / Subjective:   Patient complains of chronic pain, and worsening anxiety. Very worried about finding a place to live after his hospitalization.      Assessment & Plan:     Atypical Chest Pain:??improved. Recurs with increased anxiety.  -MVH:QIONGEEKG:Normal sinus rhythm,??Inferior infarct, age undetermined,??RBBB  -troponin negative x 3 negative   -ECHO: EF 55 - 60%. No regional wall motion abnormality noted. Mild (grade 1) left ventricular diastolic dysfunction.  - Cardiology following, have now signed off.   - cardiac cath 6/9: Normal LVEDP, [name] ~70% stenosis, but no intervention.  ??  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 Takes Xanax 2mg  tid last filled in May 20th 90 pills but patientstates he lost his medication while travelling here. Last dose was on Thursday 04/03/2019  - PO ativan PRN switched to scheduled low dose xanax - to continue to wean as outpatient.   ??  Severe anxiety/ Panic attacks   C/w Hallucinations: hx of Bipolar and schizophrenia.??  - Auditory and visual hallucinations of deceased wife.??  -denies HI and SI, not on any mood medications except for Xanax  -seen by psyche , recommendation noted but pt does not want those medications as he thinks that makes him weak   - hydroxyzine prn   ??  DM type II with hyperglycemia BS mostly in goal 210-135  - SSI   - HGA1c 9.0  - started on NPH 18 U bid. C/w ISS  ??  [name]:   -lipid panel LDL 123  - started on statin  ????  HTN: stable  - not on any meds per pharmacy  - started on lisinopril 20mg  daily    -prn hydralazine  ??  COPD not in exacerbation  -CXR negative  -guaifenesin as needed for cough, albuterol inhaler as needed  - COVID 19 negative ??  - recommend pulmonology and PFT eval as outpt   ??  ETOH Dependence:   - drinks 12 beers/day.  -CIWA protocol now out of window for withdrawal   ??  Tobacco Dependence: current smoker 2ppd-1/2ppd.  -not interested in quitting, counseled for cessation  -nicotine patch  ??  Fall 6/10  - CT head: No significant intracranial abnormalities  - CT neck: Avulsion fracture of the tip of the T1 spinous process, probably chronic  - NSGY consult placed   ??  Homelessness  -case management consult for resources    Code status: Full  DVT prophylaxis: SCD    Care Plan discussed with: Patient/Family and Nurse  Anticipated Disposition: Home w/Family   Anticipated Discharge: TBD medically stable for d/c, awaiting placement.     Hospital Problems  Date Reviewed: May 02, 2019          Codes Class Noted POA    Schizophrenia ([place]) ICD-10-CM: F20.9  ICD-9-CM: 295.90  04/09/2019 Yes        Bipolar 1 disorder, depressed, severe ([place]) ICD-10-CM: F31.4  ICD-9-CM: 296.53  04/09/2019 Yes        Tobacco abuse ICD-10-CM: Z72.0  ICD-9-CM: 305.1  04/09/2019 Yes        Alcohol abuse ICD-10-CM: F10.10  ICD-9-CM: 305.00  04/09/2019 Yes        Chest pain ICD-10-CM: R07.9  ICD-9-CM: 786.50  02-May-2019 Yes                Review of Systems:   A comprehensive review of systems was negative.       Vital Signs:    Last 24hrs VS reviewed since prior progress note. Most recent are:  Visit Vitals  BP 114/60 (BP 1 Location: Left arm)   Pulse 86   Temp 98.3 ??F (36.8 ??C)   Resp 16   Ht 5\' 10"  (1.778 m)   Wt 116.9 kg (257 lb 11.2 oz)   SpO2 97%   BMI 36.98 kg/m??       No intake or output data in the 24 hours ending  1443     Physical Examination:             Constitutional:  No acute distress, cooperative, pleasant    ENT:  Oral mucosa moist, oropharynx benign.    Resp:  CTA bilaterally. No wheezing/rhonchi/rales. No accessory muscle use   CV:  Regular rhythm, normal rate, no murmurs, gallops, rubs + pain to palpation over the chest    GI:  Soft, non distended, non tender. normoactive bowel sounds, no hepatosplenomegaly     Musculoskeletal:  No edema, warm, 2+ pulses throughout    Neurologic:  Moves all extremities.  AAOx3, CN II-XII reviewed     Psych:  + Anxious        Data Review:    Review and/or order of clinical lab test  Review and/or order of tests in the radiology section of CPT  Review and/or order of tests in the medicine section of CPT      Labs:     No results for input(s): WBC, HGB, HCT, PLT, HGBEXT, HCTEXT, PLTEXT, HGBEXT,  HCTEXT, PLTEXT in the last 72 hours.  No results for input(s): NA, K, CL, CO2, BUN, CREA, GLU, CA, MG, PHOS, URICA in the last 72 hours.   No results for input(s): ALT, AP, TBIL, TBILI, TP, ALB, GLOB, GGT, AML, LPSE in the last 72 hours.    No lab exists for component: SGOT, GPT, AMYP, HLPSE  Recent Labs     04/15/19  0015   INR 1.0   PTP 10.1   APTT 26.0      No results for input(s): FE, TIBC, PSAT, FERR in the last 72 hours.   No results found for: FOL, RBCF   No results for input(s): PH, PCO2, PO2 in the last 72 hours.  No results for input(s): CPK, CKNDX, TROIQ in the last 72 hours.    No lab exists for component: CPKMB  Lab Results   Component Value Date/Time    Cholesterol, total 201 (H) 04/08/2019 12:29 AM    HDL Cholesterol 36 04/08/2019 12:29 AM    LDL, calculated 123.2 (H) 16/10/960406/07/2019 12:29 AM    Triglyceride 209 (H) 04/08/2019 12:29 AM    CHOL/HDL Ratio 5.6 (H) 04/08/2019 12:29 AM     Lab Results   Component Value Date/Time    Glucose (POC) 135 (H)  12:03 PM    Glucose (POC) 210 (H)  06:07 AM    Glucose (POC) 183 (H) 04/16/2019 09:07 PM    Glucose (POC) 159 (H) 04/16/2019 04:20 PM    Glucose (POC) 185 (H) 04/16/2019 11:37 AM     Lab Results   Component Value Date/Time    Color YELLOW/STRAW 04/08/2019 02:07 AM    Appearance CLEAR 04/08/2019 02:07 AM    Specific gravity 1.019 04/08/2019 02:07 AM    pH (UA) 5.0 04/08/2019 02:07 AM    Protein 100 (A) 04/08/2019 02:07 AM    Glucose Negative 04/08/2019 02:07 AM    Ketone Negative 04/08/2019 02:07 AM    Bilirubin Negative 04/08/2019 02:07 AM    Urobilinogen 0.2 04/08/2019 02:07 AM    Nitrites Negative 04/08/2019 02:07 AM    Leukocyte Esterase Negative 04/08/2019 02:07 AM    Epithelial cells FEW 04/08/2019 02:07 AM    Bacteria Negative 04/08/2019 02:07 AM    WBC 0-4 04/08/2019 02:07 AM    RBC 50-100 04/08/2019 02:07 AM         Medications Reviewed:     Current Facility-Administered Medications   Medication Dose Route Frequency   ??? ALPRAZolam (XANAX) tablet 0.25 mg  0.25 mg Oral BID   ??? insulin NPH (NOVOLIN N, HUMULIN N) injection 18 Units  18 Units SubCUTAneous ACB/HS    ??? senna-docusate (PERICOLACE) 8.6-50 mg per tablet 1 Tab  1 Tab Oral DAILY   ??? oxyCODONE-acetaminophen (PERCOCET) 5-325 mg per tablet 1 Tab  1 Tab Oral Q8H PRN   ??? thiamine HCL (B-1) tablet 100 mg  100 mg Oral DAILY   ??? folic acid (FOLVITE) tablet 1 mg  1 mg Oral DAILY   ??? therapeutic multivitamin (THERAGRAN) tablet 1 Tab  1 Tab Oral DAILY   ??? lisinopriL (PRINIVIL, ZESTRIL) tablet 20 mg  20 mg Oral DAILY   ??? sodium chloride (NS) flush 5-40 mL  5-40 mL IntraVENous Q8H   ??? sodium chloride (NS) flush 5-40 mL  5-40 mL IntraVENous PRN   ??? famotidine (PEPCID) tablet 20 mg  20 mg Oral BID   ??? hydrOXYzine HCL (ATARAX) tablet 25 mg  25 mg Oral TID PRN   ???  atorvastatin (LIPITOR) tablet 40 mg  40 mg Oral QHS   ??? sodium chloride (NS) flush 5-40 mL  5-40 mL IntraVENous Q8H   ??? sodium chloride (NS) flush 5-40 mL  5-40 mL IntraVENous PRN   ??? heparin (porcine) injection 5,000 Units  5,000 Units SubCUTAneous Q8H   ??? glucose chewable tablet 16 g  4 Tab Oral PRN   ??? glucagon (GLUCAGEN) injection 1 mg  1 mg IntraMUSCular PRN   ??? insulin lispro (HUMALOG) injection   SubCUTAneous AC&HS   ??? acetaminophen (TYLENOL) tablet 650 mg  650 mg Oral Q4H PRN   ??? hydrALAZINE (APRESOLINE) 20 mg/mL injection 20 mg  20 mg IntraVENous Q6H PRN   ??? guaiFENesin (ROBITUSSIN) 100 mg/5 mL oral liquid 100 mg  100 mg Oral Q4H PRN   ??? ondansetron (ZOFRAN) injection 4 mg  4 mg IntraVENous Q4H PRN   ??? nicotine (NICODERM CQ) 14 mg/24 hr patch 1 Patch  1 Patch TransDERmal DAILY   ??? albuterol (PROVENTIL HFA, VENTOLIN HFA, PROAIR HFA) inhaler 2 Puff  2 Puff Inhalation Q4H PRN   ??? aspirin chewable tablet 81 mg  81 mg Oral DAILY     ______________________________________________________________________  EXPECTED LENGTH OF STAY: 2d 14h  ACTUAL LENGTH OF STAY:          9                 Derek SalvageJessica I Mirakov Cohen, MD

## 2019-04-17 NOTE — Progress Notes (Signed)
When passing morning medications the patient informed me that he threw up around 0300. He stated it was just a small amount. I asked him if he felt ok now and he said yes, its just his nerves. I asked him why he did not call for me and tell me and he said he didn't want to bother anyone because it was just his nerves. I informed him to always call for the nurse if something like that happens again.

## 2019-04-17 NOTE — Progress Notes (Signed)
Hospital follow-up new PCP transitional care appointment has been scheduled with Dr. Clinton Sawyer for Wednesday, 04/23/19 at 11:15 a.m.  Pending patient discharge.  Elio Forget, Care Management Specialist.    Specialty appointment has been scheduled with Dr. Veneda Melter for Friday, 04/25/19 at 1:40 p.m.  Pending patient discharge.  Elio Forget, Care Management Specialist.

## 2019-04-17 NOTE — Progress Notes (Signed)
Physical Therapy      Chart reviewed and cleared by RN. Two attempts to work with pt this AM. Initially pt reports episode of vomiting and requesting to come back in an hour. Returned later and pt continues to decline. Will defer for today. Pt can be up with staff members to ambulate in hallway.    Thank You,  Ulyses Jarred, PT, DPT

## 2019-04-17 NOTE — Progress Notes (Signed)
Bedside shift change report given to Sela Hua, Charity fundraiser (Cabin crew) by Lequita Halt, RN (offgoing nurse). Report included the following information SBAR, Kardex and MAR.

## 2019-04-17 NOTE — Progress Notes (Signed)
Progress Notes by Derek Mclaughlin, Lessie Funderburke I, MD at  1443                Author: Doreen SalvageMirakov Mclaughlin, Derek Kirsh I, MD  Service: Hospitalist  Author Type: Physician       Filed:  1453  Date of Service:  1443  Status: Signed          Editor: Derek Mclaughlin, Jodelle GrossJessica I, MD (Physician)                    [name]                                                                                              Hospitalist Progress Note   Derek SalvageJessica Mclaughlin Mirakov Cohen, MD   Answering service: [phone] from in house phone            Date of Service:     NAME:  Derek PummelRobert Mclaughlin   DOB:  [birth year]   MRN:  [id number]           Admission Summary:        Derek MaduroRobert Mclaughlin??is a 68 y.o.??male??who presents with PMH of DM, [name], HTN, [name], [name] ??He is visiting from??[place]??and??states he was supposed to living with friends when  he got here but the house was vacant when he arrived.??Has lived in motel until he ran out of money now homeless.??He has been out of medications??for about 1 week prior to coming to ED. He??states he has a history of alcohol dependence and??restarted  drinking alcohol about 2 weeks??prior to coming to ED??and drinks 12 beers/day. He??states he has a hx of bipolar and schizophrenia and has had an increase in symptoms of depression over last few months, he stated??he doesn't care if he lives  but denies suicidal and homicidal ideations.He??states he is??having??auditory and visual??hallucinations??x2 weeks where he hears his deceased wifes voice and thinks he has seen her in a room with him.??   Work up in ED: negative CXR, EKG, cardiology consult, ECHO ordered, labs reviewed, psych consult, r/o COVID. 04/07/2019   ??      Interval history /  Subjective:        Patient complains of chronic pain, and worsening anxiety. Very worried about finding a place to live after his hospitalization.           Assessment & Plan:          Atypical Chest Pain:??improved. Recurs with increased anxiety.   -ION:GEXBMWEKG:Normal sinus rhythm,??Inferior infarct, age undetermined,??RBBB   -troponin negative x 3 negative   -ECHO: EF 55 - 60%. No regional wall motion abnormality noted. Mild (grade 1) left ventricular diastolic dysfunction.   - Cardiology following, have now signed off.    - cardiac cath 6/9: Normal LVEDP, [name] ~70% stenosis, but no intervention.   ??   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 Takes Xanax 2mg  tid last filled in May 20th 90 pills but patientstates he lost his medication while travelling here. Last dose was on Thursday 04/03/2019   - PO ativan PRN switched to scheduled low dose xanax - to continue to wean as outpatient.    ??   Severe anxiety/ Panic attacks    C/w Hallucinations: hx of Bipolar and schizophrenia.??   - Auditory and visual hallucinations of deceased wife.??   -denies HI and SI, not on any mood medications except for Xanax   -seen by psyche , recommendation noted but pt does not want those medications as he thinks that makes him weak    - hydroxyzine prn    ??   DM type II with hyperglycemia BS mostly in goal 210-135   - SSI    - HGA1c 9.0   - started on NPH 18 U bid. C/w ISS   ??   [name]:    -lipid panel LDL 123   - started on statin   ????   HTN: stable   - not on any meds per pharmacy   - started on lisinopril 20mg  daily     -prn hydralazine   ??   COPD not in exacerbation   -CXR negative   -guaifenesin as needed for cough, albuterol inhaler as needed   - COVID 19 negative ??   - recommend pulmonology and PFT eval as outpt    ??   ETOH Dependence:    - drinks 12 beers/day.   -CIWA protocol now out of window for withdrawal    ??   Tobacco Dependence: current smoker 2ppd-1/2ppd.   -not interested in quitting, counseled for cessation    -nicotine patch   ??   Fall 6/10   - CT head: No significant intracranial abnormalities   - CT neck: Avulsion fracture of the tip of the T1 spinous process, probably chronic   - NSGY consult placed    ??   Homelessness   -case management consult for resources      Code status: Full   DVT prophylaxis: SCD      Care Plan discussed with: Patient/Family and Nurse   Anticipated Disposition: Home w/Family   Anticipated Discharge: TBD medically stable for d/c, awaiting placement.           Hospital Problems   Date Reviewed:  04/07/2019                         Codes  Class  Noted  POA              Schizophrenia ([place])  ICD-10-CM: F20.9   ICD-9-CM: 295.90    04/09/2019  Yes                        Bipolar 1 disorder, depressed, severe ([place])  ICD-10-CM: F31.4   ICD-9-CM: 296.53    04/09/2019  Yes                        Tobacco abuse  ICD-10-CM: Z72.0   ICD-9-CM: 305.1    04/09/2019  Yes                        Alcohol abuse  ICD-10-CM: F10.10   ICD-9-CM: 305.00    04/09/2019  Yes  Chest pain  ICD-10-CM: R07.9   ICD-9-CM: 786.50    04/07/2019  Yes                               Review of Systems:     A comprehensive review of systems was negative.            Vital Signs:      Last 24hrs VS reviewed since prior progress note. Most recent are:   Visit Vitals      BP  114/60 (BP 1 Location: Left arm)     Pulse  86     Temp  98.3 ??F (36.8 ??C)     Resp  16     Ht   (1.778 m)     Wt  116.9 kg (257 lb 11.2 oz)     SpO2  97%        BMI  36.98 kg/m??           No intake or output data in the 24 hours ending  1443         Physical Examination:                     Constitutional:   No acute distress, cooperative, pleasant      ENT:   Oral mucosa moist, oropharynx benign.      Resp:   CTA bilaterally. No wheezing/rhonchi/rales. No accessory muscle use     CV:   Regular rhythm, normal rate, no murmurs, gallops, rubs + pain to palpation over the chest         GI:   Soft, non distended, non tender. normoactive bowel  sounds, no hepatosplenomegaly          Musculoskeletal:   No edema, warm, 2+ pulses throughout         Neurologic:   Moves all extremities.  AAOx3, CN II-XII reviewed       Psych:  + Anxious                Data Review:      Review and/or order of clinical lab test   Review and/or order of tests in the radiology section of CPT   Review and/or order of tests in the medicine section of CPT           Labs:        No results for input(s): WBC, HGB, HCT, PLT, HGBEXT, HCTEXT, PLTEXT, HGBEXT, HCTEXT, PLTEXT in the last 72 hours.   No results for input(s): NA, K, CL, CO2, BUN, CREA, GLU, CA, MG, PHOS, URICA in the last 72 hours.   No results for input(s): ALT, AP, TBIL, TBILI, TP, ALB, GLOB, GGT, AML, LPSE in the last 72 hours.      No lab exists for component: SGOT, GPT, AMYP, HLPSE     Recent Labs           04/15/19   0015     INR  1.0     PTP  10.1        APTT  26.0         No results for input(s): FE, TIBC, PSAT, FERR in the last 72 hours.    No results found for: FOL, RBCF    No results for input(s): PH, PCO2, PO2 in the last 72 hours.   No results for input(s): CPK, CKNDX, TROIQ in the last 72  hours.      No lab exists for component: CPKMB     Lab Results         Component  Value  Date/Time            Cholesterol, total  201 (H)  04/08/2019 12:29 AM       HDL Cholesterol  36  04/08/2019 12:29 AM       LDL, calculated  123.2 (H)  04/08/2019 12:29 AM       Triglyceride  209 (H)  04/08/2019 12:29 AM            CHOL/HDL Ratio  5.6 (H)  04/08/2019 12:29 AM          Lab Results         Component  Value  Date/Time            Glucose (POC)  135 (H)   12:03 PM       Glucose (POC)  210 (H)   06:07 AM       Glucose (POC)  183 (H)  04/16/2019 09:07 PM       Glucose (POC)  159 (H)  04/16/2019 04:20 PM            Glucose (POC)  185 (H)  04/16/2019 11:37 AM          Lab Results         Component  Value  Date/Time            Color  YELLOW/STRAW  04/08/2019 02:07 AM       Appearance  CLEAR  04/08/2019 02:07 AM        Specific gravity  1.019  04/08/2019 02:07 AM       pH (UA)  5.0  04/08/2019 02:07 AM       Protein  100 (A)  04/08/2019 02:07 AM       Glucose  Negative  04/08/2019 02:07 AM       Ketone  Negative  04/08/2019 02:07 AM       Bilirubin  Negative  04/08/2019 02:07 AM       Urobilinogen  0.2  04/08/2019 02:07 AM       Nitrites  Negative  04/08/2019 02:07 AM       Leukocyte Esterase  Negative  04/08/2019 02:07 AM       Epithelial cells  FEW  04/08/2019 02:07 AM       Bacteria  Negative  04/08/2019 02:07 AM       WBC  0-4  04/08/2019 02:07 AM            RBC  50-100  04/08/2019 02:07 AM                Medications Reviewed:          Current Facility-Administered Medications          Medication  Dose  Route  Frequency           ?  ALPRAZolam (XANAX) tablet 0.25 mg   0.25 mg  Oral  BID     ?  insulin NPH (NOVOLIN N, HUMULIN N) injection 18 Units   18 Units  SubCUTAneous  ACB/HS     ?  senna-docusate (PERICOLACE) 8.6-50 mg per tablet 1 Tab   1 Tab  Oral  DAILY     ?  oxyCODONE-acetaminophen (PERCOCET) 5-325 mg per tablet 1 Tab   1 Tab  Oral  Q8H PRN     ?  thiamine HCL (B-1) tablet 100 mg   100 mg  Oral  DAILY     ?  folic acid (FOLVITE) tablet 1 mg   1 mg  Oral  DAILY     ?  therapeutic multivitamin (THERAGRAN) tablet 1 Tab   1 Tab  Oral  DAILY           ?  lisinopriL (PRINIVIL, ZESTRIL) tablet 20 mg   20 mg  Oral  DAILY           ?  sodium chloride (NS) flush 5-40 mL   5-40 mL  IntraVENous  Q8H     ?  sodium chloride (NS) flush 5-40 mL   5-40 mL  IntraVENous  PRN     ?  famotidine (PEPCID) tablet 20 mg   20 mg  Oral  BID     ?  hydrOXYzine HCL (ATARAX) tablet 25 mg   25 mg  Oral  TID PRN     ?  atorvastatin (LIPITOR) tablet 40 mg   40 mg  Oral  QHS     ?  sodium chloride (NS) flush 5-40 mL   5-40 mL  IntraVENous  Q8H     ?  sodium chloride (NS) flush 5-40 mL   5-40 mL  IntraVENous  PRN     ?  heparin (porcine) injection 5,000 Units   5,000 Units  SubCUTAneous  Q8H     ?  glucose chewable tablet 16 g   4 Tab  Oral  PRN     ?   glucagon (GLUCAGEN) injection 1 mg   1 mg  IntraMUSCular  PRN     ?  insulin lispro (HUMALOG) injection     SubCUTAneous  AC&HS     ?  acetaminophen (TYLENOL) tablet 650 mg   650 mg  Oral  Q4H PRN     ?  hydrALAZINE (APRESOLINE) 20 mg/mL injection 20 mg   20 mg  IntraVENous  Q6H PRN     ?  guaiFENesin (ROBITUSSIN) 100 mg/5 mL oral liquid 100 mg   100 mg  Oral  Q4H PRN     ?  ondansetron (ZOFRAN) injection 4 mg   4 mg  IntraVENous  Q4H PRN     ?  nicotine (NICODERM CQ) 14 mg/24 hr patch 1 Patch   1 Patch  TransDERmal  DAILY     ?  albuterol (PROVENTIL HFA, VENTOLIN HFA, PROAIR HFA) inhaler 2 Puff   2 Puff  Inhalation  Q4H PRN           ?  aspirin chewable tablet 81 mg   81 mg  Oral  DAILY        ______________________________________________________________________   EXPECTED LENGTH OF STAY: 2d 14h   ACTUAL LENGTH OF STAY:          9                    Derek SalvageJessica Mclaughlin Mirakov Cohen, MD

## 2019-04-17 NOTE — Group Note (Signed)
 Diabetes Mgmt by Moises Rosaline LABOR, CNS at 2019/05/16 1140                Author: Moises Rosaline LABOR, CNS  Service: Certified Clinical Nurse Specialist  Author Type: Clinical Nurse Specialist       Filed: 05/16/2019 1144  Date of Service: 05-16-19 1140  Status: Signed          Editor: Moises Rosaline LABOR, CNS (Clinical Nurse Specialist)               [facility]   CLINICAL NURSE SPECIALIST CONSULT   PROGRAM FOR DIABETES HEALTH      FOLLOW-UP NOTE        Presentation     Derek Mclaughlin is a 68 y.o. male with a PMH of DM, [name], HTN, [name] s/p stent placement, COPD, Afib, amxiety, MI, schizophrenia and alcohol abuse who presented  to the ED with a c/o chest pain for a week with N/V/SOB and cough.  [place] when he got here but the house was vacant when he arrived.Has lived in motel until he ran  out of money now homeless.He has been out of medicationsfor about 1 week prior to coming to ED. Also verbalized he was experiencing auditory and visual hallucinations the past two weeks.  In the ED he was admitted for a cardiac work-up.        Subjective        Case Management assisting with referrals to inpatient ETOH facilities after discharge   .      Objective     Physical exam   General Alert, oriented and in no acute distress/ill-appearing. Conversant and cooperative.   Vital Signs    Visit Vitals      BP  114/60 (BP 1 Location: Left arm)     Pulse  86     Temp  98.3 F (36.8 C)     Resp  16     Ht  5' 10 (1.778 m)     Wt  116.9 kg (257 lb 11.2 oz)     SpO2  97%        BMI  36.98 kg/m        Skin  Warm and dry   Heart   Regular rate and rhythm. No murmurs, rubs or gallops   Lungs  Clear to auscultation without rales or rhonchi   Extremities No foot wounds      Laboratory     Lab Results         Component  Value  Date/Time            Hemoglobin A1c  9.1 (H)  04/07/2019 04:49 PM          Lab Results         Component  Value  Date/Time             LDL, calculated  123.2 (H)  04/08/2019 12:29 AM          Lab Results         Component  Value  Date/Time            Creatinine  1.17  04/12/2019 01:54 AM          Lab Results         Component  Value  Date/Time            Sodium  135 (L)  04/12/2019 01:54 AM  Potassium  4.1  04/12/2019 01:54 AM       Chloride  105  04/12/2019 01:54 AM       CO2  24  04/12/2019 01:54 AM       Anion gap  6  04/12/2019 01:54 AM       Glucose  196 (H)  04/12/2019 01:54 AM       BUN  26 (H)  04/12/2019 01:54 AM       Creatinine  1.17  04/12/2019 01:54 AM       BUN/Creatinine ratio  22 (H)  04/12/2019 01:54 AM       GFR est AA  >60  04/12/2019 01:54 AM       GFR est non-AA  >60  04/12/2019 01:54 AM       Calcium  8.8  04/12/2019 01:54 AM       Bilirubin, total  0.2  04/12/2019 01:54 AM       Alk. phosphatase  74  04/12/2019 01:54 AM       Protein, total  6.6  04/12/2019 01:54 AM       Albumin  2.8 (L)  04/12/2019 01:54 AM       Globulin  3.8  04/12/2019 01:54 AM       A-G Ratio  0.7 (L)  04/12/2019 01:54 AM            ALT (SGPT)  32  04/12/2019 01:54 AM          Lab Results         Component  Value  Date/Time            ALT (SGPT)  32  04/12/2019 01:54 AM           Blood glucose pattern                Assessment and Plan        Nursing Diagnosis  Risk for unstable blood glucose pattern     Nursing Intervention Domain  5250 Decision-making Support        Nursing Interventions  Examined current inpatient diabetes control    Explored factors facilitating and impeding inpatient management          Evaluation     Derek Mclaughlin is a 68 y.o. male with a PMH of DM, [name], HTN, [name] s/p stent placement, COPD, Afib, amxiety, MI, schizophrenia and alcohol abuse who presented to the ED with a c/o  chest pain for a week with N/V/SOB and cough.  [place] friends when he got here but the house was vacant when he arrived.Has lived in motel until he ran out of money now homeless.His   medications and glucometer were lost during his transit to [place] .  Unfortunately, due to financial limitations, his diet and food options will be limited on discharge.  At this time, his blood glucose is in goal of 100-180.       Update:   BG trends yesterday 156-185mg /dl. Required 8units of correctional insulin yesterday.  AM BG today 210.  Received 18 units NPH last evening.    Spoke with CM re: his disposition to the rehab in [place]  - need to ensure they will be able to administer his insulin and check his BG since he does not have a glucometer at this time.     Recommendations/ Discharge Planning     Recommend:   1.CONTINUE  basal NPH to 18units BID  2. Adjust correctional insulin to resistance scale ACHS      On Discharge:   Given his A1C was 9.1% and cost and resources are a supply concern.     -It would be recommended to resume his PTA metformin 1000 mg daily.     -Would add NPH 18 units BID (if tolerates increase)   -Will need to establish a PCP- per case management recommendations    -Will need script for a new glucometer and supplies:  Check glucose twice daily (at breakfast and dinner)           Billing Code(s)        Thank you for including us  in their care.  I spent 15 minutes in direct patient care today for this patient.  Time includes chart review, face to face with patient and collaboration with interdisciplinary care team.         Rosaline DELENA Bangs, CNS   Access via [employer] (C) [phone]

## 2019-04-17 NOTE — Progress Notes (Signed)
Patient stated his back was very itchy. I rubbed lotion on and patient said it felt better. Small scratches seen from where patient was scratching.

## 2019-04-17 NOTE — Progress Notes (Signed)
TOC:    Patient is ready for discharge.    CM spoke to Druscilla Brownie at [facility] several times today and faxed over all requested information.  Follow up appointments are being made for PCP and Cardiology.  CM unable to make appointment with [facility] they are closed until Monday.  Per Debby Bud, that will not hold up decision.    COVID test pending.  If negative it is possible that [facility] can accept tomorrow.    Cheree Ditto, RN/CRM

## 2019-04-18 LAB — COVID-19: SARS-CoV-2: NOT DETECTED

## 2019-04-18 LAB — CBC WITH AUTO DIFFERENTIAL
Basophils %: 0 % (ref 0–1)
Basophils Absolute: 0.1 10*3/uL (ref 0.0–0.1)
Eosinophils %: 3 % (ref 0–7)
Eosinophils Absolute: 0.3 10*3/uL (ref 0.0–0.4)
Granulocyte Absolute Count: 0.1 10*3/uL — ABNORMAL HIGH (ref 0.00–0.04)
Hematocrit: 42.4 % (ref 36.6–50.3)
Hemoglobin: 13.9 g/dL (ref 12.1–17.0)
Immature Granulocytes: 1 % — ABNORMAL HIGH (ref 0.0–0.5)
Lymphocytes %: 25 % (ref 12–49)
Lymphocytes Absolute: 2.8 10*3/uL (ref 0.8–3.5)
MCH: 29 PG (ref 26.0–34.0)
MCHC: 32.8 g/dL (ref 30.0–36.5)
MCV: 88.5 FL (ref 80.0–99.0)
MPV: 11.5 FL (ref 8.9–12.9)
Monocytes %: 8 % (ref 5–13)
Monocytes Absolute: 0.9 10*3/uL (ref 0.0–1.0)
NRBC Absolute: 0 10*3/uL (ref 0.00–0.01)
Neutrophils %: 63 % (ref 32–75)
Neutrophils Absolute: 7.1 10*3/uL (ref 1.8–8.0)
Nucleated RBCs: 0 PER 100 WBC
Platelets: 260 10*3/uL (ref 150–400)
RBC: 4.79 M/uL (ref 4.10–5.70)
RDW: 13.7 % (ref 11.5–14.5)
WBC: 11.2 10*3/uL — ABNORMAL HIGH (ref 4.1–11.1)

## 2019-04-18 LAB — BASIC METABOLIC PANEL
Anion Gap: 8 mmol/L (ref 5–15)
BUN: 23 MG/DL — ABNORMAL HIGH (ref 6–20)
Bun/Cre Ratio: 19 (ref 12–20)
CO2: 26 mmol/L (ref 21–32)
Calcium: 8.6 MG/DL (ref 8.5–10.1)
Chloride: 103 mmol/L (ref 97–108)
Creatinine: 1.2 MG/DL (ref 0.70–1.30)
EGFR IF NonAfrican American: >60 mL/min/{1.73_m2} (ref >60)
GFR African American: >60 mL/min/{1.73_m2} (ref >60)
Glucose: 167 mg/dL — ABNORMAL HIGH (ref 65–100)
Potassium: 3.8 mmol/L (ref 3.5–5.1)
Sodium: 137 mmol/L (ref 136–145)

## 2019-04-18 LAB — MAGNESIUM
Magnesium: 1.8 mg/dL (ref 1.6–2.4)
Magnesium: 1.8 mg/dL (ref 1.6–2.4)

## 2019-04-18 LAB — POCT GLUCOSE
POC Glucose: 167 mg/dL — ABNORMAL HIGH (ref 65–100)
POC Glucose: 165 mg/dL — ABNORMAL HIGH (ref 65–100)
POC Glucose: 149 mg/dL — ABNORMAL HIGH (ref 65–100)

## 2019-04-18 LAB — PHOSPHORUS
Phosphorus: 3.9 MG/DL (ref 2.6–4.7)
Phosphorus: 3.9 MG/DL (ref 2.6–4.7)

## 2019-04-18 LAB — SARS-COV-2: SARS-CoV-2 by PCR: NOT DETECTED

## 2019-04-18 LAB — CBC WITH AUTOMATED DIFF
ABS. BASOPHILS: 0.1 10*3/uL (ref 0.0–0.1)
ABS. EOSINOPHILS: 0.3 10*3/uL (ref 0.0–0.4)
ABS. IMM. GRANS.: 0.1 10*3/uL — ABNORMAL HIGH (ref 0.00–0.04)
ABS. LYMPHOCYTES: 2.8 10*3/uL (ref 0.8–3.5)
ABS. MONOCYTES: 0.9 10*3/uL (ref 0.0–1.0)
ABS. NEUTROPHILS: 7.1 10*3/uL (ref 1.8–8.0)
ABSOLUTE NRBC: 0 10*3/uL (ref 0.00–0.01)
BASOPHILS: 0 % (ref 0–1)
EOSINOPHILS: 3 % (ref 0–7)
HCT: 42.4 % (ref 36.6–50.3)
HGB: 13.9 g/dL (ref 12.1–17.0)
IMMATURE GRANULOCYTES: 1 % — ABNORMAL HIGH (ref 0.0–0.5)
LYMPHOCYTES: 25 % (ref 12–49)
MCH: 29 PG (ref 26.0–34.0)
MCHC: 32.8 g/dL (ref 30.0–36.5)
MCV: 88.5 FL (ref 80.0–99.0)
MONOCYTES: 8 % (ref 5–13)
MPV: 11.5 FL (ref 8.9–12.9)
NEUTROPHILS: 63 % (ref 32–75)
NRBC: 0 PER 100 WBC
PLATELET: 260 10*3/uL (ref 150–400)
RBC: 4.79 M/uL (ref 4.10–5.70)
RDW: 13.7 % (ref 11.5–14.5)
WBC: 11.2 10*3/uL — ABNORMAL HIGH (ref 4.1–11.1)

## 2019-04-18 LAB — GLUCOSE, POC
Glucose (POC): 167 mg/dL — ABNORMAL HIGH (ref 65–100)
Glucose (POC): 165 mg/dL — ABNORMAL HIGH (ref 65–100)
Glucose (POC): 149 mg/dL — ABNORMAL HIGH (ref 65–100)

## 2019-04-18 LAB — METABOLIC PANEL, BASIC
Anion gap: 8 mmol/L (ref 5–15)
BUN/Creatinine ratio: 19 (ref 12–20)
BUN: 23 MG/DL — ABNORMAL HIGH (ref 6–20)
CO2: 26 mmol/L (ref 21–32)
Calcium: 8.6 MG/DL (ref 8.5–10.1)
Chloride: 103 mmol/L (ref 97–108)
Creatinine: 1.2 MG/DL (ref 0.70–1.30)
GFR est AA: >60 mL/min/{1.73_m2} (ref >60)
GFR est non-AA: >60 mL/min/{1.73_m2} (ref >60)
Glucose: 167 mg/dL — ABNORMAL HIGH (ref 65–100)
Potassium: 3.8 mmol/L (ref 3.5–5.1)
Sodium: 137 mmol/L (ref 136–145)

## 2019-04-18 MED ORDER — OTHER
0 refills | Status: AC
Start: 2019-04-18 — End: ?

## 2019-04-18 MED ORDER — LISINOPRIL 10 MG TAB
10 mg | ORAL_TABLET | Freq: Every day | ORAL | 0 refills | Status: DC
Start: 2019-04-18 — End: 2019-04-25
  Filled 2019-04-18: qty 60, 30d supply, fill #0

## 2019-04-18 MED ORDER — ALPRAZOLAM 2 MG TAB
2 mg | ORAL_TABLET | Freq: Two times a day (BID) | ORAL | 0 refills | Status: AC
Start: 2019-04-18 — End: 2019-05-09
  Filled 2019-04-18: qty 21, 14d supply, fill #0

## 2019-04-18 MED ORDER — OXYCODONE-ACETAMINOPHEN 5 MG-325 MG TAB
5-325 mg | ORAL_TABLET | Freq: Three times a day (TID) | ORAL | 0 refills | Status: AC | PRN
Start: 2019-04-18 — End: 2019-04-23
  Filled 2019-04-18: qty 15, 5d supply, fill #0

## 2019-04-18 MED ORDER — ALBUTEROL SULFATE HFA 90 MCG/ACTUATION AEROSOL INHALER
90 mcg/actuation | RESPIRATORY_TRACT | 0 refills | Status: AC | PRN
Start: 2019-04-18 — End: 2019-05-18
  Filled 2019-04-18: qty 8.5, 15d supply, fill #0

## 2019-04-18 MED ORDER — THERAPEUTIC MULTIVITAMIN TAB
ORAL_TABLET | Freq: Every day | ORAL | 0 refills | Status: AC
Start: 2019-04-18 — End: 2019-05-19
  Filled 2019-04-18: qty 30, 30d supply, fill #0

## 2019-04-18 MED ORDER — ASPIRIN 81 MG CHEWABLE TAB
81 mg | ORAL_TABLET | Freq: Every day | ORAL | 0 refills | Status: DC
Start: 2019-04-18 — End: 2019-04-25
  Filled 2019-04-18: qty 30, 30d supply, fill #0

## 2019-04-18 MED ORDER — INSULIN NPH HUMAN RECOMB 100 UNIT/ML INJECTION
100 unit/mL | Freq: Two times a day (BID) | SUBCUTANEOUS | 0 refills | Status: AC
Start: 2019-04-18 — End: 2019-05-18
  Filled 2019-04-18: qty 10, 27d supply, fill #0

## 2019-04-18 MED ORDER — THIAMINE HCL 100 MG TAB
100 mg | ORAL_TABLET | Freq: Every day | ORAL | 0 refills | Status: AC
Start: 2019-04-18 — End: 2019-05-19
  Filled 2019-04-18: qty 30, 30d supply, fill #0

## 2019-04-18 MED ORDER — SENNOSIDES-DOCUSATE SODIUM 8.6 MG-50 MG TAB
ORAL_TABLET | Freq: Every day | ORAL | 0 refills | Status: AC
Start: 2019-04-18 — End: ?
  Filled 2019-04-18: qty 30, 30d supply, fill #0

## 2019-04-18 MED ORDER — FOLIC ACID 1 MG TAB
1 mg | ORAL_TABLET | Freq: Every day | ORAL | 0 refills | Status: AC
Start: 2019-04-18 — End: 2019-05-19
  Filled 2019-04-18: qty 30, 30d supply, fill #0

## 2019-04-18 MED ORDER — HYDROXYZINE 25 MG TAB
25 mg | ORAL_TABLET | Freq: Three times a day (TID) | ORAL | 0 refills | Status: AC | PRN
Start: 2019-04-18 — End: 2019-04-28
  Filled 2019-04-18: qty 30, 10d supply, fill #0

## 2019-04-18 MED ORDER — ATORVASTATIN 40 MG TAB
40 mg | ORAL_TABLET | Freq: Every evening | ORAL | 0 refills | Status: DC
Start: 2019-04-18 — End: 2019-04-25
  Filled 2019-04-18: qty 30, 30d supply, fill #0

## 2019-04-18 MED FILL — NORMAL SALINE FLUSH 0.9 % INJECTION SYRINGE: INTRAMUSCULAR | Qty: 10

## 2019-04-18 MED FILL — INSULIN NPH HUMAN RECOMB 100 UNIT/ML INJECTION: 100 unit/mL | SUBCUTANEOUS | Qty: 1

## 2019-04-18 MED FILL — THERAPEUTIC MULTIVITAMIN TAB: ORAL | Qty: 1

## 2019-04-18 MED FILL — HEPARIN (PORCINE) 5,000 UNIT/ML IJ SOLN: 5000 unit/mL | INTRAMUSCULAR | Qty: 1

## 2019-04-18 MED FILL — SENEXON-S 8.6 MG-50 MG TABLET: ORAL | Qty: 1

## 2019-04-18 MED FILL — LISINOPRIL 20 MG TAB: 20 mg | ORAL | Qty: 1

## 2019-04-18 MED FILL — THIAMINE HCL 100 MG TAB: 100 mg | ORAL | Qty: 1

## 2019-04-18 MED FILL — FOLIC ACID 1 MG TAB: 1 mg | ORAL | Qty: 1

## 2019-04-18 MED FILL — CHILDREN'S ASPIRIN 81 MG CHEWABLE TABLET: 81 mg | ORAL | Qty: 1

## 2019-04-18 MED FILL — INSULIN LISPRO 100 UNIT/ML INJECTION: 100 unit/mL | SUBCUTANEOUS | Qty: 1

## 2019-04-18 MED FILL — ALPRAZOLAM 0.25 MG TAB: 0.25 mg | ORAL | Qty: 2

## 2019-04-18 MED FILL — ONDANSETRON (PF) 4 MG/2 ML INJECTION: 4 mg/2 mL | INTRAMUSCULAR | Qty: 2

## 2019-04-18 MED FILL — OXYCODONE-ACETAMINOPHEN 5 MG-325 MG TAB: 5-325 mg | ORAL | Qty: 1

## 2019-04-18 MED FILL — NICOTINE 14 MG/24 HR DAILY PATCH: 14 mg/24 hr | TRANSDERMAL | Qty: 1

## 2019-04-18 MED FILL — ACETAMINOPHEN 325 MG TABLET: 325 mg | ORAL | Qty: 2

## 2019-04-18 MED FILL — FAMOTIDINE 20 MG TAB: 20 mg | ORAL | Qty: 1

## 2019-04-18 MED FILL — ATORVASTATIN 40 MG TAB: 40 mg | ORAL | Qty: 1

## 2019-04-18 MED FILL — PRODIGY AUTOCODE BLOOD W/DEVICE KIT: 30 days supply | Qty: 1 | Fill #0 | Status: AC

## 2019-04-18 MED FILL — PRODIGY TWIST TOP LANCETS 28  MISC: 30 days supply | Qty: 100 | Fill #0 | Status: AC

## 2019-04-18 MED FILL — LEADER INSULIN SYR 31G X 5/16""X .3ML: 30 days supply | Qty: 60 | Fill #0 | Status: AC

## 2019-04-18 MED FILL — PRODIGY NO CODING BLOOD GLUC  STRP: 30 days supply | Qty: 100 | Fill #0 | Status: AC

## 2019-04-18 NOTE — Progress Notes (Signed)
TOC:    RUR 14%    COVID negative    [facility] has accepted patient. CM spoke to Andre Young.    CM will fax prescriptions to [facility] pharmacy when available.    CM to arrange Lyft transportation to [facility] by 3pm.    Kristi Nelligan, RN/CRM

## 2019-04-18 NOTE — Other (Signed)
[place]  CLINICAL NURSE SPECIALIST CONSULT  PROGRAM FOR DIABETES HEALTH    FOLLOW-UP NOTE    Presentation   Derek Mclaughlin is a 68 y.o. male with a PMH of DM, [name], HTN, [name] s/p stent placement, COPD, Afib, amxiety, MI, schizophrenia and alcohol abuse who presented to the ED with a c/o chest pain for a week with N/V/SOB and cough.  He is visiting from??[place]and??states he was supposed to living with friends when he got here but the house was vacant when he arrived.??Has lived in [place] until he ran out of money now homeless.??He has been out of medications??for about 1 week prior to coming to ED. Also verbalized he was experiencing auditory and visual hallucinations the past two weeks.  In the ED he was admitted for a cardiac work-up.    Subjective   Derek Mclaughlin is looking forward to getting sober -  He verbalized and demonstrated that he knew how to give himself insulin via the pen.   Case Management assisting with referrals to inpatient ETOH facilities after discharge  .   Objective   Physical exam  General Alert, oriented and in no acute distress. Conversant and cooperative.  Vital Signs   Visit Vitals  BP 125/87 (BP 1 Location: Left arm, BP Patient Position: At rest)   Pulse 71   Temp 98.9 ??F (37.2 ??C)   Resp 16   Ht '5\' 10"'$  (1.778 m)   Wt 116.9 kg (257 lb 11.2 oz)   SpO2 96%   BMI 36.98 kg/m??     Skin  Warm and dry  Heart   Regular rate and rhythm. No murmurs, rubs or gallops  Lungs  Clear to auscultation without rales or rhonchi  Extremities No foot wounds    Laboratory  Lab Results   Component Value Date/Time    Hemoglobin A1c 9.1 (H) 04/07/2019 04:49 PM     Lab Results   Component Value Date/Time    LDL, calculated 123.2 (H) 04/08/2019 12:29 AM     Lab Results   Component Value Date/Time    Creatinine 1.20 04/18/2019 02:08 AM     Lab Results   Component Value Date/Time    Sodium 137 04/18/2019 02:08 AM    Potassium 3.8 04/18/2019 02:08 AM    Chloride 103 04/18/2019 02:08 AM    CO2 26 04/18/2019 02:08 AM     Anion gap 8 04/18/2019 02:08 AM    Glucose 167 (H) 04/18/2019 02:08 AM    BUN 23 (H) 04/18/2019 02:08 AM    Creatinine 1.20 04/18/2019 02:08 AM    BUN/Creatinine ratio 19 04/18/2019 02:08 AM    GFR est AA >60 04/18/2019 02:08 AM    GFR est non-AA >60 04/18/2019 02:08 AM    Calcium 8.6 04/18/2019 02:08 AM    Bilirubin, total 0.2 04/12/2019 01:54 AM    Alk. phosphatase 74 04/12/2019 01:54 AM    Protein, total 6.6 04/12/2019 01:54 AM    Albumin 2.8 (L) 04/12/2019 01:54 AM    Globulin 3.8 04/12/2019 01:54 AM    A-G Ratio 0.7 (L) 04/12/2019 01:54 AM    ALT (SGPT) 32 04/12/2019 01:54 AM     Lab Results   Component Value Date/Time    ALT (SGPT) 32 04/12/2019 01:54 AM       Blood glucose pattern        Assessment and Plan   Nursing Diagnosis Risk for unstable blood glucose pattern   Nursing Intervention Domain 5250 Decision-making Support  Nursing Interventions Examined current inpatient diabetes control   Explored factors facilitating and impeding inpatient management     Evaluation   Derek Mclaughlin is a 68 y.o. male with a PMH of DM, [name], HTN, [name] s/p stent placement, COPD, Afib, amxiety, MI, schizophrenia and alcohol abuse who presented to the ED with a c/o chest pain for a week with N/V/SOB and cough.  He is visiting from??[place]and??states he was supposed to living with friends when he got here but the house was vacant when he arrived.??Has lived in [place] until he ran out of money now homeless.??His medications and glucometer were lost during his transit to [place].  Unfortunately, due to financial limitations, his diet and food options will be limited on discharge.  At this time, his blood glucose is in goal of 100-180.     Update:   Mr.  is headed to the [facility] today.  His BG trends are very stable and within target range on 18units NPH BID.    Recommendations/ Discharge Planning   Recommend:  1.CONTINUE  basal NPH to 18units BID    2. Adjust correctional insulin to resistance scale ACHS     On Discharge:  Given his A1C was 9.1% and cost and resources are a supply concern.    -It would be recommended to resume his PTA metformin 1000 mg daily.    -Would add NPH 18 units BID (if tolerates increase)  -Will need to establish a PCP- per case management recommendations   -Will need script for a new glucometer and supplies:  Check glucose twice daily (at breakfast and dinner)      Billing Code(s)     Thank you for including [place] in their care.  I spent 15 minutes in direct patient care today for this patient.  Time includes chart review, face to face with patient and collaboration with interdisciplinary care team.      Lily Lovings, CNS  Access via [place] (C) [phone]

## 2019-04-18 NOTE — Progress Notes (Signed)
I have reviewed discharge instructions with the patient.  The patient verbalized understanding.    Discharge medications reviewed with patient and appropriate educational materials and side effects teaching were provided.

## 2019-04-18 NOTE — Progress Notes (Signed)
Bedside shift change report given to Mallory, RN (oncoming nurse) by Morgan, RN (offgoing nurse). Report included the following information SBAR, Kardex, Intake/Output and MAR.

## 2019-04-18 NOTE — Discharge Summary (Signed)
Discharge Summary       PATIENT ID: Derek Mclaughlin  MRN: [id number]   DATE OF BIRTH: 1951/09/21    DATE OF ADMISSION: 04/07/2019 10:54 AM    DATE OF DISCHARGE: 04/18/2019  PRIMARY CARE PROVIDER: Lacey Jensen, MD     DISCHARGING PROVIDER: Candis Musa, MD    To contact this individual call [phone] and ask the operator to page.  If unavailable ask to be transferred the Adult Hospitalist Department.    CONSULTATIONS: IP CONSULT TO HOSPITALIST  IP CONSULT TO CARDIOLOGY  IP CONSULT TO PSYCHIATRY  IP CONSULT TO NEUROSURGERY    PROCEDURES/SURGERIES: Procedure(s):  LEFT HEART CATH / CORONARY ANGIOGRAPHY  Fractional Flow [place] COURSE:   Atypical chest pain???likely secondary to anxiety, cast negative  Benzodiazepine withdrawal, now on a taper  Severe anxiety  Bipolar disorder and schizophrenia  Type 2 diabetes, insulin-dependent  Hypertension, hyperlipidemia  COPD not in exacerbation  Alcohol dependence and tobacco dependence    Per H&P    Jaymir Bora??is a 68 y.o.??male??who presents with PMH of DM, [name], HTN, [name], COPD here with c/o chest pain??x1 week and worsening??since last night??with associated??N/V and also having shortness of breath and cough.??He describes pain as??tight??heaviness to left side of chest??and radiates up to neck??and jaw. ??He is visiting from??[place]??and??states he was supposed to living with friends when he got here but the house was vacant when he arrived.??Has lived in [place] until he ran out of money now homeless.??He has been out of medications??for about 1 week prior to coming to ED. He??states he has a history of alcohol dependence and??restarted drinking alcohol about 2 weeks??prior to coming to ED??and drinks 12 beers/day. He??states he has a hx of bipolar and schizophrenia and has had an increase in symptoms of depression over last few months, he stated??he doesn't care if he lives but denies suicidal and homicidal  ideations.He??states he is??having??auditory and visual??hallucinations??x2 weeks where he hears his deceased wifes voice and thinks he has seen her in a room with him.??  Work up in ED: negative CXR, EKG, cardiology consult, ECHO ordered, labs reviewed, psych consult, r/o COVID.??04/07/2019    Hospital stay was significant for ongoing atypical chest pain, resulting in a eventual left heart cath.  His cath showed stable coronary artery disease, and was not compatible with ACS.  Patient psychiatric history including anxiety bipolar and schizophrenia likely contributing to his atypical chest pain.  Psychiatry had recommended to start antipsychotic medications, however patient refuses.  Patient was found to be taking Xanax 3 times daily consistently therefore some of his anxiety was likely related to abrupt cessation.  We have been using PRN Ativan which she has been getting regularly here in the hospital.  I will therefore plan to discharge him on a slow taper of Xanax.  He will need to be monitored for worsening withdrawal symptoms.  Patient should be encouraged to take his antipsychotic meds medications, and perhaps establish care with a provider in order to develop trust.  As far as alcohol is related, patient's CIWA scores have been low, and he is stable for discharge.  He has been counseled on cessation.  He has had significant hyperglycemia due to diabetes in the hospital, his regimen was switched over to NPH in order to facilitate improved compliance given the high cost of other insulins.  [facility] will then help him undergo permanent housing.  Please see below  for further information regarding hospital stay.    DISCHARGE DIAGNOSES / PLAN:      Atypical Chest Pain:??improved. Recurs with increased anxiety.  -NGE:XBMWUXEKG:Normal sinus rhythm,??Inferior infarct, age undetermined,??RBBB  -troponin negative x 3 negative   -ECHO: EF 55 - 60%. No regional wall motion abnormality noted. Mild (grade 1) left ventricular diastolic dysfunction.  - Cardiology following, have now signed off.   - cardiac cath 6/9: Normal LVEDP, [name] ~70% stenosis, but no intervention.  ??  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 Takes Xanax 2mg  tid last filled in May 20th 90 pills but patientstates he lost his medication while travelling here. Last dose was on Thursday 04/03/2019  - PO ativan PRN switched to scheduled low dose xanax - to continue to wean as outpatient.   ??  Severe anxiety/ Panic attacks??  C/w Hallucinations: hx of Bipolar and schizophrenia.??  - Auditory and visual hallucinations of deceased wife.??  -denies HI and SI, not on any mood medications except for Xanax  -seen by psyche , recommendation noted but pt does not want those medications as he thinks that makes him weak   - hydroxyzine prn??  ??  DM type II with hyperglycemia BS mostly in goal 210-135  - SSI   - HGA1c 9.0  - started on NPH??18 U bid. C/w ISS  ??  [name]:   -lipid panel LDL 123  -??started on??statin  ????  HTN:??stable  -??not on any meds per pharmacy  - started on lisinopril 20mg  daily????  -prn hydralazine  ??  COPD not in exacerbation  -CXR negative  -guaifenesin as needed for cough,??albuterol inhaler as needed  - COVID 19 negative ??  - recommend pulmonology and PFT eval as outpt??  ??  ETOH Dependence:   - drinks 12 beers/day.  -CIWA protocol now out of window for withdrawal   ??  Tobacco Dependence: current smoker 2ppd-1/2ppd.  -not interested in quitting, counseled for cessation  -nicotine patch  ??  Fall 6/10  - CT head:??No significant intracranial abnormalities  - CT neck:??Avulsion fracture of the tip of the T1 spinous process, probably chronic  - NSGY consult placed??  ??  Homelessness  -case management consult for resources     ADDITIONAL CARE RECOMMENDATIONS:   - Please take all medications as prescribed. Note changes as below.    **Reduce your Xanax to twice a day, take this for 1 week.  After 1 week please reduce to once a day.  **Only take pain medications as needed, you will need to see her primary care physician in order to get a long-term prescription.  - Please make sure to follow up with your primary care physician within 1-2 weeks of discharge for hospital follow up.  You should also establish care with a psychiatrist, and we encourage you to start antipsychotic medications as these will help with your anxiety.  You also need to follow-up with a cardiologist this appointment has been set for you in 1 week.  - Please get up slowly from a seated or laying position, avoid falls.   - Avoid tobacco, alcohol and other illicit drug use.         PENDING TEST RESULTS:   At the time of discharge the following test results are still pending: None    FOLLOW UP APPOINTMENTS:    Follow-up Information     Follow up With Specialties Details Why Contact Info    Veneda MelterGupta, Navin, MD Cardiology On 04/25/2019 Hospital follow  up visit Friday, 04/25/19 @ 1:40 p.m.  [address]  [address]  [place] [id number]  [phone]      Cassell ClementBala, Rishi K, MD [facility] On 04/23/2019 Hospital follow up new PCP appointment Wednesday, 04/23/19 @ 11:15 a.m.  Please bring photo ID, insurance card and a listing of all medications.  Please wear face covering.  [address]  [place] [id number]  [phone]               DIET: Cardiac Diet and Diabetic Diet    ACTIVITY: Activity as tolerated    WOUND CARE: None    EQUIPMENT needed: None      DISCHARGE MEDICATIONS:  Current Discharge Medication List      START taking these medications    Details   albuterol (PROVENTIL HFA, VENTOLIN HFA, PROAIR HFA) 90 mcg/actuation inhaler Take 2 Puffs by inhalation every four (4) hours as needed for Wheezing or Shortness of Breath for up to 30 days.  Qty: 1 Inhaler, Refills: 0      aspirin 81 mg chewable tablet Take 1 Tab by mouth daily for 30 days.   Qty: 30 Tab, Refills: 0      atorvastatin (LIPITOR) 40 mg tablet Take 1 Tab by mouth nightly for 30 days.  Qty: 30 Tab, Refills: 0      folic acid (FOLVITE) 1 mg tablet Take 1 Tab by mouth daily for 30 days.  Qty: 30 Tab, Refills: 0      hydrOXYzine HCL (ATARAX) 25 mg tablet Take 1 Tab by mouth three (3) times daily as needed for Itching or Anxiety for up to 10 days.  Qty: 30 Tab, Refills: 0      insulin NPH (NOVOLIN N, HUMULIN N) 100 unit/mL injection 18 Units by SubCUTAneous route every twelve (12) hours for 30 days.  Qty: 1 Vial, Refills: 0      senna-docusate (PERICOLACE) 8.6-50 mg per tablet Take 1 Tab by mouth daily.  Qty: 30 Tab, Refills: 0      therapeutic multivitamin (THERAGRAN) tablet Take 1 Tab by mouth daily for 30 days.  Qty: 30 Tab, Refills: 0      thiamine HCL (B-1) 100 mg tablet Take 1 Tab by mouth daily for 30 days.  Qty: 30 Tab, Refills: 0         CONTINUE these medications which have CHANGED    Details   ALPRAZolam (XANAX) 2 mg tablet Take 1 Tab by mouth two (2) times a day for 21 days. Max Daily Amount: 4 mg. Please take 1 tab by mouth twice a day for 7 days. Then take one tab daily for 7 days.  Qty: 21 Tab, Refills: 0    Associated Diagnoses: Other chest pain      lisinopriL (PRINIVIL, ZESTRIL) 10 mg tablet Take 2 Tabs by mouth daily for 30 days.  Qty: 60 Tab, Refills: 0      oxyCODONE-acetaminophen (PERCOCET) 5-325 mg per tablet Take 1 Tab by mouth every eight (8) hours as needed for Pain for up to 5 days. Max Daily Amount: 3 Tabs.  Qty: 15 Tab, Refills: 0    Associated Diagnoses: Other chest pain         CONTINUE these medications which have NOT CHANGED    Details   metFORMIN (GLUCOPHAGE) 1,000 mg tablet Take 1,000 mg by mouth daily.         STOP taking these medications       baclofen (  LIORESAL) 10 mg tablet Comments:   Reason for Stopping:         pregabalin (Lyrica) 75 mg capsule Comments:   Reason for Stopping:          insulin lispro (HumaLOG U-100 Insulin) 100 unit/mL injection Comments:   Reason for Stopping:         insulin lispro (HumaLOG U-100 Insulin) 100 unit/mL injection Comments:   Reason for Stopping:         insulin lispro (HUMALOG) 100 unit/mL kwikpen Comments:   Reason for Stopping:                 NOTIFY YOUR PHYSICIAN FOR ANY OF THE FOLLOWING:   Fever over 101 degrees for 24 hours.   Chest pain, shortness of breath, fever, chills, nausea, vomiting, diarrhea, change in mentation, falling, weakness, bleeding. Severe pain or pain not relieved by medications.  Or, any other signs or symptoms that you may have questions about.    DISPOSITION:    Home With:   OT  PT  HH  RN       Long term SNF/Inpatient Rehab    Independent/assisted living    Hospice   x Other: [facility]       PATIENT CONDITION AT DISCHARGE:     Functional status    Poor     Deconditioned    x Independent      Cognition    x Lucid     Forgetful     Dementia      Catheters/lines (plus indication)    Foley     PICC     PEG    x None      Code status    x Full code     DNR      PHYSICAL EXAMINATION AT DISCHARGE:  General:          Alert, cooperative, no distress, appears stated age.     HEENT:           Atraumatic, anicteric sclerae, pink conjunctivae                          No oral ulcers, mucosa moist, throat clear, dentition fair  Neck:               Supple, symmetrical  Lungs:             Clear to auscultation bilaterally.  No Wheezing or Rhonchi. No rales.  Chest wall:      No tenderness  No Accessory muscle use.  Heart:              Regular  rhythm,  No  murmur   No edema  Abdomen:        Soft, non-tender. Not distended.  Bowel sounds normal  Extremities:     No cyanosis.  No clubbing,                            Skin turgor normal, Capillary refill normal  Skin:                Not pale.  Not Jaundiced  No rashes   Psych:             + anxious not agitated.   Neurologic:      Alert, moves all extremities, answers questions  appropriately and responds to commands  CHRONIC MEDICAL DIAGNOSES:  Problem List as of 04/18/2019 Date Reviewed: 04/07/2019          Codes Class Noted - Resolved    Schizophrenia (HCC) ICD-10-CM: F20.9  ICD-9-CM: 295.90  04/09/2019 - Present        Bipolar 1 disorder, depressed, severe (HCC) ICD-10-CM: F31.4  ICD-9-CM: 296.53  04/09/2019 - Present        Tobacco abuse ICD-10-CM: Z72.0  ICD-9-CM: 305.1  04/09/2019 - Present        Alcohol abuse ICD-10-CM: F10.10  ICD-9-CM: 305.00  04/09/2019 - Present        Chest pain ICD-10-CM: R07.9  ICD-9-CM: 786.50  04/07/2019 - Present              Greater than 30 minutes were spent with the patient on counseling and coordination of care    Signed:   Doreen SalvageJessica I Mirakov Cohen, MD  04/18/2019  11:01 AM

## 2019-04-18 NOTE — Group Note (Signed)
 Diabetes Mgmt by Moises Rosaline LABOR, CNS at 04/18/19 1424                Author: Moises Rosaline LABOR, CNS  Service: Certified Clinical Nurse Specialist  Author Type: Clinical Nurse Specialist       Filed: 04/18/19 1431  Date of Service: 04/18/19 1424  Status: Signed          Editor: Moises Rosaline LABOR, CNS (Clinical Nurse Specialist)               Derek Mclaughlin   CLINICAL NURSE SPECIALIST CONSULT   PROGRAM FOR DIABETES HEALTH      FOLLOW-UP NOTE        Presentation     Derek Mclaughlin is a 68 y.o. male with a PMH of DM, [name], HTN, [name] s/p stent placement, COPD, Afib, amxiety, MI, schizophrenia and alcohol abuse who presented  to the ED with a c/o chest pain for a week with N/V/SOB and cough.  [place] when he got here but the house was vacant when he arrived.Has lived in motel until he ran  out of money now homeless.He has been out of medicationsfor about 1 week prior to coming to ED. Also verbalized he was experiencing auditory and visual hallucinations the past two weeks.  In the ED he was admitted for a cardiac work-up.        Subjective     Derek Mclaughlin is looking forward to getting sober -  He verbalized and demonstrated that he knew how to give himself insulin via the pen.    Case Management assisting with referrals to inpatient ETOH facilities after discharge   .      Objective     Physical exam   General Alert, oriented and in no acute distress. Conversant and cooperative.   Vital Signs    Visit Vitals      BP  125/87 (BP 1 Location: Left arm, BP Patient Position: At rest)     Pulse  71     Temp  98.9 F (37.2 C)     Resp  16     Ht  5' 10 (1.778 m)     Wt  116.9 kg (257 lb 11.2 oz)     SpO2  96%        BMI  36.98 kg/m        Skin  Warm and dry   Heart   Regular rate and rhythm. No murmurs, rubs or gallops   Lungs  Clear to auscultation without rales or rhonchi   Extremities No foot wounds      Laboratory     Lab Results          Component  Value  Date/Time            Hemoglobin A1c  9.1 (H)  04/07/2019 04:49 PM          Lab Results         Component  Value  Date/Time            LDL, calculated  123.2 (H)  04/08/2019 12:29 AM          Lab Results         Component  Value  Date/Time            Creatinine  1.20  04/18/2019 02:08 AM          Lab Results  Component  Value  Date/Time            Sodium  137  04/18/2019 02:08 AM       Potassium  3.8  04/18/2019 02:08 AM       Chloride  103  04/18/2019 02:08 AM       CO2  26  04/18/2019 02:08 AM       Anion gap  8  04/18/2019 02:08 AM       Glucose  167 (H)  04/18/2019 02:08 AM       BUN  23 (H)  04/18/2019 02:08 AM       Creatinine  1.20  04/18/2019 02:08 AM       BUN/Creatinine ratio  19  04/18/2019 02:08 AM       GFR est AA  >60  04/18/2019 02:08 AM       GFR est non-AA  >60  04/18/2019 02:08 AM       Calcium  8.6  04/18/2019 02:08 AM       Bilirubin, total  0.2  04/12/2019 01:54 AM       Alk. phosphatase  74  04/12/2019 01:54 AM       Protein, total  6.6  04/12/2019 01:54 AM       Albumin  2.8 (L)  04/12/2019 01:54 AM       Globulin  3.8  04/12/2019 01:54 AM       A-G Ratio  0.7 (L)  04/12/2019 01:54 AM            ALT (SGPT)  32  04/12/2019 01:54 AM          Lab Results         Component  Value  Date/Time            ALT (SGPT)  32  04/12/2019 01:54 AM           Blood glucose pattern                Assessment and Plan        Nursing Diagnosis  Risk for unstable blood glucose pattern     Nursing Intervention Domain  5250 Decision-making Support        Nursing Interventions  Examined current inpatient diabetes control    Explored factors facilitating and impeding inpatient management          Evaluation     Derek Mclaughlin is a 68 y.o. male with a PMH of DM, [name], HTN, [name] s/p stent placement, COPD, Afib, amxiety, MI, schizophrenia and alcohol abuse who presented to the ED with a c/o  chest pain for a week with N/V/SOB and cough.  [place] with friends when he got here but the house was vacant when he arrived.Has lived in motel until he ran out of money now homeless.His  medications and glucometer were lost during his transit to [place] .  Unfortunately, due to financial limitations, his diet and food options will be limited on discharge.  At this time, his blood glucose is in goal of 100-180.       Update:   Derek Mclaughlin is headed to [facility] today.  His BG trends are very stable and within target range on 18units NPH BID.       Recommendations/ Discharge Planning     Recommend:   1.CONTINUE  basal NPH to 18units BID      2. Adjust correctional  insulin to resistance scale ACHS      On Discharge:   Given his A1C was 9.1% and cost and resources are a supply concern.     -It would be recommended to resume his PTA metformin 1000 mg daily.     -Would add NPH 18 units BID (if tolerates increase)   -Will need to establish a PCP- per case management recommendations    -Will need script for a new glucometer and supplies:  Check glucose twice daily (at breakfast and dinner)           Billing Code(s)        Thank you for including us  in their care.  I spent 15 minutes in direct patient care today for this patient.  Time includes chart review, face to face with patient and collaboration with interdisciplinary care team.         Rosaline DELENA Bangs, CNS   Access via [employer] & (C) [phone]

## 2019-04-18 NOTE — Progress Notes (Signed)
I have reviewed discharge instructions with the patient.  The patient verbalized understanding.  Discharge medications reviewed with patient and appropriate educational materials and side effects teaching were provided.

## 2019-04-18 NOTE — Progress Notes (Signed)
TOC:    RUR 14%    COVID negative    [facility] has accepted patient. CM spoke to [employer].    CM will fax prescriptions to [facility] pharmacy when available.    CM to arrange Lyft transportation to [facility] by 3pm.    Cheree Ditto, RN/CRM

## 2019-04-18 NOTE — Discharge Summary (Signed)
Discharge Summary by Doreen Salvage, MD at 04/18/19 1101                Author: Ellard Artis, Jodelle Gross, MD  Service: Hospitalist  Author Type: Physician       Filed: 04/18/19 1110  Date of Service: 04/18/19 1101  Status: Signed          Editor: Doreen Salvage, MD (Physician)                       Discharge Summary           PATIENT ID: Derek Mclaughlin   MRN: [id number]    DATE OF BIRTH: 07-15-1951     DATE OF ADMISSION: 04/07/2019 10:54 AM     DATE OF DISCHARGE: 04/18/2019   PRIMARY CARE PROVIDER: Sol Blazing, MD        DISCHARGING PROVIDER: Doreen Salvage, MD      To contact this individual call [phone] and ask the operator to page.  If unavailable ask to be transferred the Adult Hospitalist Department.      CONSULTATIONS: IP CONSULT TO HOSPITALIST   IP CONSULT TO CARDIOLOGY   IP CONSULT TO PSYCHIATRY   IP CONSULT TO NEUROSURGERY      PROCEDURES/SURGERIES: Procedure(s):   LEFT HEART CATH / CORONARY ANGIOGRAPHY   Fractional Flow Reserve      ADMITTING DIAGNOSES & HOSPITAL COURSE:    Atypical chest pain-likely secondary to anxiety, cast negative   Benzodiazepine withdrawal, now on a taper   Severe anxiety   Bipolar disorder and schizophrenia   Type 2 diabetes, insulin-dependent   Hypertension, hyperlipidemia   COPD not in exacerbation   Alcohol dependence and tobacco dependence      Per H&P      Derek Mclaughlin??is a 67 y.o.??male??who presents with PMH of DM, [name], HTN, [name], [name] ??He is visiting from??[place]??and??states he was supposed to living with friends when  he got here but the house was vacant when he arrived.??Has lived in motel until he ran out of money now homeless.??He has been out of medications??for about 1 week prior to coming to ED. He??states he has  a history of alcohol dependence and??restarted  drinking alcohol about 2 weeks??prior to coming to ED??and drinks 12 beers/day. He??states he has a hx of bipolar and schizophrenia and has had an increase in symptoms of depression over last few months, he stated??he doesn't care if he lives  but denies suicidal and homicidal ideations.He??states he is??having??auditory and visual??hallucinations??x2 weeks where he hears his deceased wifes voice and thinks he has seen her in a room with him.??   Work up in ED: negative CXR, EKG, cardiology consult, ECHO ordered, labs reviewed, psych consult, r/o COVID.??04/07/2019      Hospital stay was significant for ongoing atypical chest pain, resulting in a eventual left heart cath.  His cath showed stable coronary artery disease, and was not compatible with ACS.  Patient psychiatric history including anxiety bipolar and schizophrenia  likely contributing to his atypical chest pain.  Psychiatry had recommended to start antipsychotic medications, however patient refuses.  Patient was found to be taking Xanax 3 times daily consistently therefore some of his anxiety was likely related  to abrupt cessation.  We have been using  PRN Ativan which she has been getting regularly here in the hospital.  I will therefore plan to discharge him on a slow taper of Xanax.  He will need to be monitored for worsening withdrawal symptoms.  Patient  should be encouraged to take his antipsychotic meds medications, and perhaps establish care with a provider in order to develop trust.  As far as alcohol is related, patient's CIWA scores have been low, and he is stable for discharge.  He has been counseled  on cessation.  He has had significant hyperglycemia due to diabetes in the hospital, his regimen was switched over to NPH in order to facilitate improved compliance given the high cost of other insulins.  [facility] will then help him undergo permanent housing.   Please see below for further information regarding hospital stay.        DISCHARGE DIAGNOSES / PLAN:           Atypical Chest Pain:??improved. Recurs with increased anxiety.   -NWG:NFAOZHEKG:Normal sinus rhythm,??Inferior infarct, age undetermined,??RBBB   -troponin negative x 3 negative   -ECHO: EF 55 - 60%. No regional wall motion abnormality noted. Mild (grade 1) left ventricular diastolic dysfunction.   - Cardiology following, have now signed off.    - cardiac cath 6/9: Normal LVEDP, [name] ~70% stenosis, but no intervention.   ??   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 Takes Xanax 2mg  tid last filled in May 20th 90 pills but patientstates he lost his medication while travelling here. Last dose was on Thursday 04/03/2019   - PO ativan PRN switched to scheduled low dose xanax - to continue to wean as outpatient.    ??   Severe anxiety/ Panic attacks??   C/w Hallucinations: hx of Bipolar and schizophrenia.??   - Auditory and visual hallucinations of deceased wife.??   -denies HI and SI, not on any mood medications except for Xanax   -seen by psyche , recommendation noted but pt does not want those medications as he thinks that makes him weak    - hydroxyzine prn??   ??   DM type II with hyperglycemia BS mostly in goal 210-135   - SSI    - HGA1c 9.0   - started on NPH??18 U bid. C/w ISS   ??   [name]:    -lipid panel LDL 123   -??started on??statin   ????   HTN:??stable   -??not on any meds per pharmacy   - started on lisinopril 20mg  daily????   -prn hydralazine   ??   COPD not in exacerbation   -CXR negative   -guaifenesin as needed for cough,??albuterol inhaler as needed   - COVID 19 negative ??   - recommend pulmonology and PFT eval as outpt??   ??   ETOH Dependence:    - drinks 12 beers/day.   -CIWA protocol now out of window for withdrawal    ??   Tobacco Dependence: current smoker 2ppd-1/2ppd.   -not interested in quitting, counseled for cessation   -nicotine patch   ??   Fall 6/10   - CT head:??No significant intracranial  abnormalities   - CT neck:??Avulsion fracture of the tip of the T1 spinous process, probably chronic   - NSGY consult placed??   ??   Homelessness   -case management consult for resources        ADDITIONAL CARE RECOMMENDATIONS:    -  Please take all medications as prescribed. Note changes as below.    **Reduce your Xanax to twice a day, take this for 1 week.  After 1 week please reduce to once a day.   **Only take pain medications as needed, you will need to see her primary care physician in order to get a long-term prescription.   - Please make sure to follow up with your primary care physician within 1-2 weeks of discharge for hospital follow up.  You should also establish care with a psychiatrist, and we encourage you to start antipsychotic medications as these will help with  your anxiety.  You also need to follow-up with a cardiologist this appointment has been set for you in 1 week.   - Please get up slowly from a seated or laying position, avoid falls.    - Avoid tobacco, alcohol and other illicit drug use.             PENDING TEST RESULTS:    At the time of discharge the following test results are still pending: None      FOLLOW UP APPOINTMENTS:       Follow-up Information               Follow up With  Specialties  Details  Why  Contact Info              Veneda MelterGupta, Navin, MD  Cardiology  On 04/25/2019  Hospital follow up visit Friday, 04/25/19 @ 1:40 p.m.   [address]   [address]   [place] [id number]   [phone]                 Cassell ClementBala, Rishi K, MD  [facility]  On 04/23/2019  Hospital follow up new PCP appointment Wednesday, 04/23/19 @ 11:15 a.m.  Please bring photo ID, insurance card and a listing of all  medications.  Please wear face covering.   [address]   [place] [id number]   [phone]                       DIET: Cardiac Diet and Diabetic Diet      ACTIVITY: Activity as tolerated      WOUND CARE: None      EQUIPMENT needed: None         DISCHARGE MEDICATIONS:     Current  Discharge Medication List              START taking these medications          Details        albuterol (PROVENTIL HFA, VENTOLIN HFA, PROAIR HFA) 90 mcg/actuation inhaler  Take 2 Puffs by inhalation every four (4) hours as needed for Wheezing or Shortness of Breath for up to 30 days.   Qty: 1 Inhaler, Refills:  0               aspirin 81 mg chewable tablet  Take 1 Tab by mouth daily for 30 days.   Qty: 30 Tab, Refills:  0               atorvastatin (LIPITOR) 40 mg tablet  Take 1 Tab by mouth nightly for 30 days.   Qty: 30 Tab, Refills:  0               folic acid (FOLVITE) 1 mg tablet  Take 1 Tab by mouth daily for 30 days.  Qty: 30 Tab, Refills:  0               hydrOXYzine HCL (ATARAX) 25 mg tablet  Take 1 Tab by mouth three (3) times daily as needed for Itching or Anxiety for up to 10 days.   Qty: 30 Tab, Refills:  0               insulin NPH (NOVOLIN N, HUMULIN N) 100 unit/mL injection  18 Units by SubCUTAneous route every twelve (12) hours for 30 days.   Qty: 1 Vial, Refills:  0               senna-docusate (PERICOLACE) 8.6-50 mg per tablet  Take 1 Tab by mouth daily.   Qty: 30 Tab, Refills:  0               therapeutic multivitamin (THERAGRAN) tablet  Take 1 Tab by mouth daily for 30 days.   Qty: 30 Tab, Refills:  0               thiamine HCL (B-1) 100 mg tablet  Take 1 Tab by mouth daily for 30 days.   Qty: 30 Tab, Refills:  0                     CONTINUE these medications which have CHANGED          Details        ALPRAZolam (XANAX) 2 mg tablet  Take 1 Tab by mouth two (2) times a day for 21 days. Max Daily Amount: 4 mg. Please take 1 tab by mouth twice a day for 7 days. Then take one tab daily  for 7 days.   Qty: 21 Tab, Refills:  0          Associated Diagnoses: Other chest pain               lisinopriL (PRINIVIL, ZESTRIL) 10 mg tablet  Take 2 Tabs by mouth daily for 30 days.   Qty: 60 Tab, Refills:  0               oxyCODONE-acetaminophen (PERCOCET) 5-325 mg per tablet  Take 1 Tab by mouth every eight  (8) hours as needed for Pain for up to 5 days. Max Daily Amount: 3 Tabs.   Qty: 15 Tab, Refills:  0          Associated Diagnoses: Other chest pain                     CONTINUE these medications which have NOT CHANGED          Details        metFORMIN (GLUCOPHAGE) 1,000 mg tablet  Take 1,000 mg by mouth daily.                     STOP taking these medications                  baclofen (LIORESAL) 10 mg tablet  Comments:    Reason for Stopping:                      pregabalin (Lyrica) 75 mg capsule  Comments:    Reason for Stopping:                      insulin lispro (HumaLOG U-100 Insulin) 100 unit/mL injection  Comments:  Reason for Stopping:                      insulin lispro (HumaLOG U-100 Insulin) 100 unit/mL injection  Comments:    Reason for Stopping:                      insulin lispro (HUMALOG) 100 unit/mL kwikpen  Comments:    Reason for Stopping:                                NOTIFY YOUR PHYSICIAN FOR ANY OF THE FOLLOWING:    Fever over 101 degrees for 24 hours.    Chest pain, shortness of breath, fever, chills, nausea, vomiting, diarrhea, change in mentation, falling, weakness, bleeding. Severe pain or pain not relieved by medications.   Or, any other signs or symptoms that you may have questions about.      DISPOSITION:         Home With:     OT    PT    HH    RN                   Long term SNF/Inpatient Rehab       Independent/assisted living          Hospice        x  Other: [facility]           PATIENT CONDITION AT DISCHARGE:       Functional status         Poor        Deconditioned         x  Independent         Cognition        x  Lucid        Forgetful           Dementia         Catheters/lines (plus indication)         Foley        PICC        PEG         x  None         Code status        x  Full code           DNR         PHYSICAL EXAMINATION AT DISCHARGE:   General:          Alert, cooperative, no distress, appears stated age.      HEENT:           Atraumatic, anicteric sclerae, pink  conjunctivae                           No oral ulcers, mucosa moist, throat clear, dentition fair   Neck:               Supple, symmetrical   Lungs:             Clear to auscultation bilaterally.  No Wheezing or Rhonchi. No rales.   Chest wall:      No tenderness  No Accessory muscle use.   Heart:              Regular  rhythm,  No  murmur   No edema   Abdomen:  Soft, non-tender. Not distended.  Bowel sounds normal   Extremities:     No cyanosis.  No clubbing,                             Skin turgor normal, Capillary refill normal   Skin:                Not pale.  Not Jaundiced  No rashes    Psych:             + anxious not agitated.    Neurologic:      Alert, moves all extremities, answers questions appropriately and responds to commands          CHRONIC MEDICAL DIAGNOSES:      Problem List as of 04/18/2019  Date Reviewed:  05/05/19                        Codes  Class  Noted - Resolved             Schizophrenia (HCC)  ICD-10-CM: F20.9   ICD-9-CM: 295.90    04/09/2019 - Present                       Bipolar 1 disorder, depressed, severe (HCC)  ICD-10-CM: F31.4   ICD-9-CM: 296.53    04/09/2019 - Present                       Tobacco abuse  ICD-10-CM: Z72.0   ICD-9-CM: 305.1    04/09/2019 - Present                       Alcohol abuse  ICD-10-CM: F10.10   ICD-9-CM: 305.00    04/09/2019 - Present                       Chest pain  ICD-10-CM: R07.9   ICD-9-CM: 786.50    05/05/2019 - Present                          Greater than 30 minutes were spent with the patient on counseling and coordination of care      Signed:    Doreen Salvage, MD   04/18/2019   11:01 AM

## 2019-04-23 ENCOUNTER — Ambulatory Visit: Attending: Family Medicine | Primary: Family Medicine

## 2019-04-23 ENCOUNTER — Encounter: Attending: Family Medicine | Primary: Family Medicine

## 2019-04-23 ENCOUNTER — Encounter

## 2019-04-23 ENCOUNTER — Ambulatory Visit: Admit: 2019-04-23 | Payer: PRIVATE HEALTH INSURANCE | Attending: Family Medicine | Primary: Family Medicine

## 2019-04-23 DIAGNOSIS — E119 Type 2 diabetes mellitus without complications: Secondary | ICD-10-CM
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
## 2019-04-24 ENCOUNTER — Encounter: Attending: Family Medicine | Primary: Family Medicine

## 2019-04-24 LAB — MICROALBUMIN / CREATININE URINE RATIO
Creatinine, Ur: 253.3 mg/dL
Microalb, Ur: 426.1 ug/mL
Microalbumin Creatinine Ratio: 168 mg/g creat — ABNORMAL HIGH (ref 0–29)

## 2019-04-24 LAB — MICROALBUMIN, UR, RAND W/ MICROALB/CREAT RATIO
Creatinine, urine random: 253.3 mg/dL
Microalb/Creat ratio (ug/mg creat.): 168 mg/g creat — ABNORMAL HIGH (ref 0–29)
Microalbumin, urine: 426.1 ug/mL

## 2019-04-24 NOTE — Telephone Encounter (Signed)
LVM for patient, needs to be screened.

## 2019-04-25 ENCOUNTER — Ambulatory Visit: Attending: Interventional Cardiology | Primary: Family Medicine

## 2019-04-25 ENCOUNTER — Ambulatory Visit
Admit: 2019-04-25 | Discharge: 2019-04-25 | Payer: PRIVATE HEALTH INSURANCE | Attending: Interventional Cardiology | Primary: Family Medicine

## 2019-04-25 DIAGNOSIS — F209 Schizophrenia, unspecified: Secondary | ICD-10-CM

## 2019-04-25 MED ORDER — ATORVASTATIN 40 MG TAB
40 mg | ORAL_TABLET | Freq: Every evening | ORAL | 0 refills | Status: AC
Start: 2019-04-25 — End: 2019-05-25

## 2019-04-25 MED ORDER — LISINOPRIL 10 MG TAB
10 mg | ORAL_TABLET | Freq: Every day | ORAL | 0 refills | Status: AC
Start: 2019-04-25 — End: 2019-05-25

## 2019-04-25 MED ORDER — ASPIRIN 81 MG CHEWABLE TAB
81 mg | ORAL_TABLET | Freq: Every day | ORAL | 0 refills | Status: AC
Start: 2019-04-25 — End: 2019-05-25

## 2019-04-25 NOTE — Addendum Note (Signed)
Addended by: Margaretmary Bayley on: 04/25/2019 02:31 PM     Modules accepted: Orders

## 2019-04-25 NOTE — Progress Notes (Signed)
Progress Note    Patient: Derek Mclaughlin MRN: [id number]  SSN: [id number]    Date of Birth: Apr 29, 1951  Age: 68 y.o.  Sex: male      Admit Date: (Not on file)    LOS: 1 day     Subjective:     Derek Mclaughlin is a 68 yo WM with bipolar disorder/schizophrenia, PAF, ETOH/Tob abuse admitted off medications with chest pain and shortness of breath. Cardiac catheterization performed with reported history of multiple stents per patient and prior myocardial infarction. Cardiac cath 04/08/2019 with moderate LAD lesion for medical therapy. (70% visual, iFFR 0.93)       Staying at [facility]. Has Case Worker helping. Tearful. Chest still bothers hi, especially at night. Gets short of breath with exertion. Legs hurt from neuropathy. Had been on Lyrica, but lost medications at [place] with his luggage.    Seeing Dr. Brayton Layman for Primary Care.    Objective:     Vitals:    04/25/19 1323   BP: 100/60   Pulse: 83   Resp: 18   SpO2: 98%   Weight: 253 lb (114.8 kg)   Height: '5\' 10"'$  (1.778 m)        Physical Exam: Overweight  General:  Alert, cooperative, well nourished, well developed, appears stated age, overweight   Eyes:  Conjunctivae/corneas clear    Nose: Nares normal. Septum midline. Mucosa normal. No drainage or sinus tenderness.   Neck: Supple, symmetrical, trachea midline, no JVD   Lungs:   Clear to auscultation bilaterally, good effort   Heart:  Regular rate and rhythm, no murmur, click, rub, or gallop   Abdomen:   Soft, non-tender, bowel sounds normal, non-distended   Extremities: Trace edema. varicosities   Skin: Skin color, texture, turgor normal. No rash or lesions.   Neurologic: Non focal, anxious       Current Outpatient Medications:   ???  ALPRAZolam (XANAX) 2 mg tablet, Take 1 Tab by mouth two (2) times a day for 21 days. Max Daily Amount: 4 mg. Please take 1 tab by mouth twice a day for 7 days. Then take one tab daily for 7 days., Disp: 21 Tab, Rfl: 0   ???  lisinopriL (PRINIVIL, ZESTRIL) 10 mg tablet, Take 2 Tabs by mouth daily for 30 days., Disp: 60 Tab, Rfl: 0  ???  albuterol (PROVENTIL HFA, VENTOLIN HFA, PROAIR HFA) 90 mcg/actuation inhaler, Take 2 Puffs by inhalation every four (4) hours as needed for Wheezing or Shortness of Breath for up to 30 days., Disp: 1 Inhaler, Rfl: 0  ???  aspirin 81 mg chewable tablet, Take 1 Tab by mouth daily for 30 days., Disp: 30 Tab, Rfl: 0  ???  atorvastatin (LIPITOR) 40 mg tablet, Take 1 Tab by mouth nightly for 30 days., Disp: 30 Tab, Rfl: 0  ???  folic acid (FOLVITE) 1 mg tablet, Take 1 Tab by mouth daily for 30 days., Disp: 30 Tab, Rfl: 0  ???  hydrOXYzine HCL (ATARAX) 25 mg tablet, Take 1 Tab by mouth three (3) times daily as needed for Itching or Anxiety for up to 10 days., Disp: 30 Tab, Rfl: 0  ???  insulin NPH (NOVOLIN N, HUMULIN N) 100 unit/mL injection, 18 Units by SubCUTAneous route every twelve (12) hours for 30 days., Disp: 1 Vial, Rfl: 0  ???  senna-docusate (PERICOLACE) 8.6-50 mg per tablet, Take 1 Tab by mouth daily., Disp: 30 Tab, Rfl: 0  ???  therapeutic multivitamin (THERAGRAN) tablet, Take 1  Tab by mouth daily for 30 days., Disp: 30 Tab, Rfl: 0  ???  thiamine HCL (B-1) 100 mg tablet, Take 1 Tab by mouth daily for 30 days., Disp: 30 Tab, Rfl: 0  ???  OTHER, Please provide 1 glucometer with 120 test strips.  Please provide 60 insulin needles and syringes., Disp: 1 Units, Rfl: 0  ???  metFORMIN (GLUCOPHAGE) 1,000 mg tablet, Take 1,000 mg by mouth daily., Disp: , Rfl:     Lab Results   Component Value Date/Time    WBC 11.2 (H) 04/18/2019 02:08 AM    HGB 13.9 04/18/2019 02:08 AM    HCT 42.4 04/18/2019 02:08 AM    PLATELET 260 04/18/2019 02:08 AM    MCV 88.5 04/18/2019 02:08 AM     Lab Results   Component Value Date/Time    Sodium 137 04/18/2019 02:08 AM    Potassium 3.8 04/18/2019 02:08 AM    Chloride 103 04/18/2019 02:08 AM    CO2 26 04/18/2019 02:08 AM    Anion gap 8 04/18/2019 02:08 AM    Glucose 167 (H) 04/18/2019 02:08 AM     BUN 23 (H) 04/18/2019 02:08 AM    Creatinine 1.20 04/18/2019 02:08 AM    BUN/Creatinine ratio 19 04/18/2019 02:08 AM    GFR est AA >60 04/18/2019 02:08 AM    GFR est non-AA >60 04/18/2019 02:08 AM    Calcium 8.6 04/18/2019 02:08 AM    Bilirubin, total 0.2 04/12/2019 01:54 AM    Alk. phosphatase 74 04/12/2019 01:54 AM    Protein, total 6.6 04/12/2019 01:54 AM    Albumin 2.8 (L) 04/12/2019 01:54 AM    Globulin 3.8 04/12/2019 01:54 AM    A-G Ratio 0.7 (L) 04/12/2019 01:54 AM    ALT (SGPT) 32 04/12/2019 01:54 AM    AST (SGOT) 21 04/12/2019 01:54 AM     Lab Results   Component Value Date/Time    Cholesterol, total 201 (H) 04/08/2019 12:29 AM    HDL Cholesterol 36 04/08/2019 12:29 AM    LDL, calculated 123.2 (H) 04/08/2019 12:29 AM    VLDL, calculated 41.8 04/08/2019 12:29 AM    Triglyceride 209 (H) 04/08/2019 12:29 AM    CHOL/HDL Ratio 5.6 (H) 04/08/2019 12:29 AM     Lab Results   Component Value Date/Time    Hemoglobin A1c 9.1 (H) 04/07/2019 04:49 PM     No results found for: TSH, TSH2, TSH3, TSHP, TSHEXT      04/07/19   ECHO ADULT COMPLETE 04/08/2019 04/08/2019    Narrative ?? Image quality for this study was suboptimal.  ?? Contrast used: DEFINITY.  ?? Normal cavity size, wall thickness and systolic function (ejection   fraction normal). Estimated left ventricular ejection fraction is 55 -   60%. No regional wall motion abnormality noted. Mild (grade 1) left   ventricular diastolic dysfunction.  ?? Mildly dilated right atrium.  ?? Trace mitral valve regurgitation is present.        Signed by: Christy Sartorius, MD       04/07/19   CARDIAC PROCEDURE 04/08/2019 04/08/2019    Narrative Findings:  1)Normal LVEDP  2)Angiographically moderate to severe disease in mid LAD(70%) not   functionally significant based on iFR(0.93).  3)Mild disease elsewhere    Recommendations  1)Consider alternative etiology for chest pain  2)GDT for primary prevention of [name] events    Access: Right radial--no issues    Contrast 50 cc        Signed by: Hyman Hopes,  Arnoldo Hooker, MD         Assessment:       Patient Active Problem List   Diagnosis Code   ??? Chest pain R07.9   ??? Schizophrenia ([place]) F20.9   ??? Bipolar 1 disorder, depressed, severe ([place]) F31.4   ??? Tobacco abuse Z72.0   ??? Alcohol abuse F10.10   ??? Severe obesity (HCC) E66.01   ??? Peripheral vascular disease (HCC) I73.9   ??? Coronary artery disease involving native coronary artery of native heart without angina pectoris I25.10       Plan:     Derek Mclaughlin is a 68 yo WM with bipolar disorder, schizophrenia and chest pain. He has moderate [name] with preserved LV systolic function. Medical therapy is warranted, particularly in light of social issues and medical non-compliance.  Agree with aspirin and statin. Antihypertensives as indicated.    I discussed diet and exercise to lose weight and promote cardiovascular health. Will obtain arterial and venous dopplers to assess peripheral disease. He complains of pain all over, and so it is difficult to discern causes of his pain. Nonetheless, he clearly has some neuropathy. I have encouraged him to see Pain Management and his other physicians as scheduled. He does seem motivated to do this.    ABI  Venous doppler  Refill cardiac medications  RTC 3 months    With lipid panel, TSH/FT4        Signed By: Christy Sartorius, MD     April 25, 2019      Interventional Cardiology  [facility], [place] [address]  P: [phone]  F: [phone]

## 2019-04-25 NOTE — Addendum Note (Signed)
Addendum  Note by Margaretmary Bayley, RN at 04/25/19 1340                Author: Margaretmary Bayley, RN  Service: --  Author Type: Registered Nurse       Filed: 04/25/19 1431  Encounter Date: 04/25/2019  Status: Signed          Editor: Margaretmary Bayley, RN (Registered Nurse)          Addended by: Margaretmary Bayley on: 04/25/2019 02:31 PM    Modules accepted: Orders

## 2019-04-25 NOTE — Progress Notes (Signed)
Progress Note    Patient: Derek Mclaughlin MRN: [id number]  SSN: [id number]    Date of Birth: Jan 14, 1951  Age: 68 y.o.  Sex: male      Admit Date: (Not on file)    LOS: 1 day     Subjective:     Derek Mclaughlin is a 68 yo WM with bipolar disorder/schizophrenia, PAF, ETOH/Tob abuse admitted off medications with chest pain and shortness of breath. Cardiac catheterization performed with reported history of multiple stents per patient and prior myocardial infarction. Cardiac cath 04/08/2019 with moderate LAD lesion for medical therapy. (70% visual, iFFR 0.93)       Staying at [facility]. Has Case Worker helping. Tearful. Chest still bothers hi, especially at night. Gets short of breath with exertion. Legs hurt from neuropathy. Had been on Lyrica, but lost medications at [place] with his luggage.    Seeing Dr. Brayton Layman for Primary Care.    Objective:     Vitals:    04/25/19 1323   BP: 100/60   Pulse: 83   Resp: 18   SpO2: 98%   Weight: 253 lb (114.8 kg)   Height: 5' 10"  (1.778 m)        Physical Exam: Overweight  General:  Alert, cooperative, well nourished, well developed, appears stated age, overweight   Eyes:  Conjunctivae/corneas clear    Nose: Nares normal. Septum midline. Mucosa normal. No drainage or sinus tenderness.   Neck: Supple, symmetrical, trachea midline, no JVD   Lungs:   Clear to auscultation bilaterally, good effort   Heart:  Regular rate and rhythm, no murmur, click, rub, or gallop   Abdomen:   Soft, non-tender, bowel sounds normal, non-distended   Extremities: Trace edema. varicosities   Skin: Skin color, texture, turgor normal. No rash or lesions.   Neurologic: Non focal, anxious       Current Outpatient Medications:   ???  ALPRAZolam (XANAX) 2 mg tablet, Take 1 Tab by mouth two (2) times a day for 21 days. Max Daily Amount: 4 mg. Please take 1 tab by mouth twice a day for 7 days. Then take one tab daily for 7 days., Disp: 21 Tab, Rfl: 0  ???  lisinopriL (PRINIVIL, ZESTRIL) 10 mg tablet,  Take 2 Tabs by mouth daily for 30 days., Disp: 60 Tab, Rfl: 0  ???  albuterol (PROVENTIL HFA, VENTOLIN HFA, PROAIR HFA) 90 mcg/actuation inhaler, Take 2 Puffs by inhalation every four (4) hours as needed for Wheezing or Shortness of Breath for up to 30 days., Disp: 1 Inhaler, Rfl: 0  ???  aspirin 81 mg chewable tablet, Take 1 Tab by mouth daily for 30 days., Disp: 30 Tab, Rfl: 0  ???  atorvastatin (LIPITOR) 40 mg tablet, Take 1 Tab by mouth nightly for 30 days., Disp: 30 Tab, Rfl: 0  ???  folic acid (FOLVITE) 1 mg tablet, Take 1 Tab by mouth daily for 30 days., Disp: 30 Tab, Rfl: 0  ???  hydrOXYzine HCL (ATARAX) 25 mg tablet, Take 1 Tab by mouth three (3) times daily as needed for Itching or Anxiety for up to 10 days., Disp: 30 Tab, Rfl: 0  ???  insulin NPH (NOVOLIN N, HUMULIN N) 100 unit/mL injection, 18 Units by SubCUTAneous route every twelve (12) hours for 30 days., Disp: 1 Vial, Rfl: 0  ???  senna-docusate (PERICOLACE) 8.6-50 mg per tablet, Take 1 Tab by mouth daily., Disp: 30 Tab, Rfl: 0  ???  therapeutic multivitamin (THERAGRAN) tablet, Take 1  Tab by mouth daily for 30 days., Disp: 30 Tab, Rfl: 0  ???  thiamine HCL (B-1) 100 mg tablet, Take 1 Tab by mouth daily for 30 days., Disp: 30 Tab, Rfl: 0  ???  OTHER, Please provide 1 glucometer with 120 test strips.  Please provide 60 insulin needles and syringes., Disp: 1 Units, Rfl: 0  ???  metFORMIN (GLUCOPHAGE) 1,000 mg tablet, Take 1,000 mg by mouth daily., Disp: , Rfl:     Lab Results   Component Value Date/Time    WBC 11.2 (H) 04/18/2019 02:08 AM    HGB 13.9 04/18/2019 02:08 AM    HCT 42.4 04/18/2019 02:08 AM    PLATELET 260 04/18/2019 02:08 AM    MCV 88.5 04/18/2019 02:08 AM     Lab Results   Component Value Date/Time    Sodium 137 04/18/2019 02:08 AM    Potassium 3.8 04/18/2019 02:08 AM    Chloride 103 04/18/2019 02:08 AM    CO2 26 04/18/2019 02:08 AM    Anion gap 8 04/18/2019 02:08 AM    Glucose 167 (H) 04/18/2019 02:08 AM    BUN 23 (H) 04/18/2019 02:08 AM    Creatinine 1.20  04/18/2019 02:08 AM    BUN/Creatinine ratio 19 04/18/2019 02:08 AM    GFR est AA >60 04/18/2019 02:08 AM    GFR est non-AA >60 04/18/2019 02:08 AM    Calcium 8.6 04/18/2019 02:08 AM    Bilirubin, total 0.2 04/12/2019 01:54 AM    Alk. phosphatase 74 04/12/2019 01:54 AM    Protein, total 6.6 04/12/2019 01:54 AM    Albumin 2.8 (L) 04/12/2019 01:54 AM    Globulin 3.8 04/12/2019 01:54 AM    A-G Ratio 0.7 (L) 04/12/2019 01:54 AM    ALT (SGPT) 32 04/12/2019 01:54 AM    AST (SGOT) 21 04/12/2019 01:54 AM     Lab Results   Component Value Date/Time    Cholesterol, total 201 (H) 04/08/2019 12:29 AM    HDL Cholesterol 36 04/08/2019 12:29 AM    LDL, calculated 123.2 (H) 04/08/2019 12:29 AM    VLDL, calculated 41.8 04/08/2019 12:29 AM    Triglyceride 209 (H) 04/08/2019 12:29 AM    CHOL/HDL Ratio 5.6 (H) 04/08/2019 12:29 AM     Lab Results   Component Value Date/Time    Hemoglobin A1c 9.1 (H) 04/07/2019 04:49 PM     No results found for: TSH, TSH2, TSH3, TSHP, TSHEXT      04/07/19   ECHO ADULT COMPLETE 04/08/2019 04/08/2019    Narrative ?? Image quality for this study was suboptimal.  ?? Contrast used: DEFINITY.  ?? Normal cavity size, wall thickness and systolic function (ejection   fraction normal). Estimated left ventricular ejection fraction is 55 -   60%. No regional wall motion abnormality noted. Mild (grade 1) left   ventricular diastolic dysfunction.  ?? Mildly dilated right atrium.  ?? Trace mitral valve regurgitation is present.        Signed by: Christy Sartorius, MD       04/07/19   CARDIAC PROCEDURE 04/08/2019 04/08/2019    Narrative Findings:  1)Normal LVEDP  2)Angiographically moderate to severe disease in mid LAD(70%) not   functionally significant based on iFR(0.93).  3)Mild disease elsewhere    Recommendations  1)Consider alternative etiology for chest pain  2)GDT for primary prevention of [name] events    Access: Right radial--no issues    Contrast 50 cc       Signed by: Berniece Salines,  MD         Assessment:       Patient Active  Problem List   Diagnosis Code   ??? Chest pain R07.9   ??? Schizophrenia (HCC) F20.9   ??? Bipolar 1 disorder, depressed, severe ([place]) F31.4   ??? Tobacco abuse Z72.0   ??? Alcohol abuse F10.10   ??? Severe obesity (HCC) E66.01   ??? Peripheral vascular disease (HCC) I73.9   ??? Coronary artery disease involving native coronary artery of native heart without angina pectoris I25.10       Plan:     Derek Mclaughlin is a 68 yo WM with bipolar disorder, schizophrenia and chest pain. He has moderate [name] with preserved LV systolic function. Medical therapy is warranted, particularly in light of social issues and medical non-compliance.  Agree with aspirin and statin. Antihypertensives as indicated.    I discussed diet and exercise to lose weight and promote cardiovascular health. Will obtain arterial and venous dopplers to assess peripheral disease. He complains of pain all over, and so it is difficult to discern causes of his pain. Nonetheless, he clearly has some neuropathy. I have encouraged him to see Pain Management and his other physicians as scheduled. He does seem motivated to do this.    ABI  Venous doppler  Refill cardiac medications  RTC 3 months    With lipid panel, TSH/FT4        Signed By: Christy Sartorius, MD     April 25, 2019      Interventional Cardiology  [facility] [address], [place] [address]  P: [phone]  F: [phone]

## 2019-04-28 ENCOUNTER — Telehealth: Payer: Self-pay | Admitting: Critical Care Medicine

## 2019-04-28 ENCOUNTER — Other Ambulatory Visit: Payer: Self-pay | Admitting: Critical Care Medicine

## 2019-04-28 MED ORDER — PREGABALIN 75 MG PO CAPS: 75.0000 mg | ORAL_CAPSULE | Freq: Two times a day (BID) | ORAL | 1 refills | Status: DC

## 2019-04-28 MED ORDER — ALPRAZOLAM 2 MG PO TABS: 2.0000 mg | ORAL_TABLET | Freq: Three times a day (TID) | ORAL | 0 refills | Status: DC | PRN

## 2019-04-28 NOTE — Telephone Encounter (Signed)
Antonio Sparks, this patient has called my cell and left a message for me to call him   I have asked him to not do this in the past, can you call him at some point to find out what his request is today?

## 2019-04-28 NOTE — Telephone Encounter (Signed)
Attempt to call patient to follow up. No answer. LMOVM-

## 2019-04-28 NOTE — Progress Notes (Signed)
Needing refills on xanax and lyrica.  Rx sent to [facility] needing f/u in office on return to [facility]

## 2019-04-30 NOTE — Telephone Encounter (Signed)
Attempt to call patient for follow up. No answer. LMOVM.

## 2019-04-30 DEATH — deceased

## 2019-05-01 ENCOUNTER — Ambulatory Visit

## 2019-05-01 ENCOUNTER — Encounter

## 2019-05-01 ENCOUNTER — Ambulatory Visit: Admit: 2019-05-01 | Discharge: 2019-05-01 | Payer: PRIVATE HEALTH INSURANCE | Primary: Family Medicine

## 2019-05-01 DIAGNOSIS — M79606 Pain in leg, unspecified: Secondary | ICD-10-CM

## 2019-05-01 DIAGNOSIS — I8393 Asymptomatic varicose veins of bilateral lower extremities: Secondary | ICD-10-CM

## 2019-05-01 LAB — VAS DUP LOWER EXTREMITY VENOUS BILATERAL
Left GSV BK Dist Diam: 0.46 cm
Left GSV BK Mid Diam: 0.4 cm
Left GSV BK Mid Rfx: 0.3 s
Left GSV Junc Diam: 0.62 cm
Left GSV Junc Rfx: 1.8 s
Left GSV Thigh Dist Diam: 0.58 cm
Left GSV Thigh Dist Rfx: 1.2 s
Left GSV Thigh Mid Diam: 0.54 cm
Left GSV Thigh Prox Diam: 0.59 cm
Left GSV Thigh Prox Rfx: 1.7 s
Left GSV Thight Mid Rfx: 1.5 s
Right GSV BK Dist Diam: 0.44 cm
Right GSV BK Dist Rfx: 0.3 s
Right GSV BK Mid Diam: 0.34 cm
Right GSV BK Mid Rfx: 0.4 s
Right GSV Junc Diam: 0.95 cm
Right GSV Thigh Dist Rfx: 1 s
Right GSV Thigh Mid Diam: 0.45 cm
Right GSV Thigh Prox Diam: 0.82 cm
Right GSV Thigh Prox Rfx: 0.5 s

## 2019-05-01 LAB — VAS ANKLE BRACHIAL INDEX (ABI)
Left ABI: 1.26
Left anterior tibial: 146 mmHg
Left arm BP: 118 mmHg
Left posterior tibial: 149 mmHg
Right ABI: 1.28
Right anterior tibial: 128 mmHg
Right arm BP: 116 mmHg
Right posterior tibial: 151 mmHg

## 2019-05-01 LAB — DUPLEX LOWER EXT VENOUS BILAT
Left GSV BK Dist Diam: 0.46 cm
Left GSV BK Mid Diam: 0.4 cm
Left GSV BK Mid Rfx: 0.3 s
Left GSV Junc Diam: 0.62 cm
Left GSV Junc Rfx: 1.8 s
Left GSV Thigh Dist Diam: 0.58 cm
Left GSV Thigh Dist Rfx: 1.2 s
Left GSV Thigh Mid Diam: 0.54 cm
Left GSV Thigh Prox Diam: 0.59 cm
Left GSV Thigh Prox Rfx: 1.7 s
Left GSV Thight Mid Rfx: 1.5 s
Right GSV BK Dist Diam: 0.44 cm
Right GSV BK Dist Rfx: 0.3 s
Right GSV BK Mid Diam: 0.34 cm
Right GSV BK Mid Rfx: 0.4 s
Right GSV Junc Diam: 0.95 cm
Right GSV Thigh Dist Rfx: 1 s
Right GSV Thigh Mid Diam: 0.45 cm
Right GSV Thigh Prox Diam: 0.82 cm
Right GSV Thigh Prox Rfx: 0.5 s

## 2019-05-01 LAB — ANKLE BRACHIAL INDEX
Left ABI: 1.26
Left anterior tibial: 146 mmHg
Left arm BP: 118 mmHg
Left posterior tibial: 149 mmHg
Right ABI: 1.28
Right anterior tibial: 128 mmHg
Right arm BP: 116 mmHg
Right posterior tibial: 151 mmHg

## 2019-05-22 ENCOUNTER — Telehealth: Payer: Self-pay | Admitting: Critical Care Medicine

## 2019-05-22 MED ORDER — ASPIRIN 81 MG PO CHEW: 81.0000 mg | CHEWABLE_TABLET | Freq: Every day | ORAL | 1 refills | Status: AC

## 2019-05-22 MED ORDER — PREGABALIN 75 MG PO CAPS: 75.0000 mg | ORAL_CAPSULE | Freq: Two times a day (BID) | ORAL | 1 refills | Status: DC

## 2019-05-22 MED ORDER — HYDROXYZINE HCL 50 MG PO TABS: 50.0000 mg | ORAL_TABLET | Freq: Three times a day (TID) | ORAL | 0 refills | Status: DC | PRN

## 2019-05-22 MED ORDER — ALPRAZOLAM 2 MG PO TABS: 2.0000 mg | ORAL_TABLET | Freq: Three times a day (TID) | ORAL | 0 refills | Status: AC | PRN

## 2019-05-22 NOTE — Telephone Encounter (Signed)
This patient contacted me today indicating he and is needing refills on hydroxyzine, Xanax, and Lyrica.  The patient currently is in [place] and expects to return to [place] in 2 weeks.  We will give him a short-term refill and request to come into the office for follow-up upon return to [facility] and August   I checked the [facility] for controlled substances and there are no other fills for this patient other than 15 Norco as prescribed by [place] provider.  In June 2020

## 2019-06-22 ENCOUNTER — Telehealth: Payer: Self-pay | Admitting: Critical Care Medicine

## 2019-06-22 ENCOUNTER — Other Ambulatory Visit: Payer: Self-pay | Admitting: Critical Care Medicine

## 2019-06-22 NOTE — Progress Notes (Signed)
Patient ID: Antonio Sparks, male   DOB: 05-Nov-1950, 68 y.o.   MRN: [id number]  This patient is in [place] and calling me for med refills.   He is wanting his xanax refilled.  I told him previously I would not cover this med any longer.  I tried to call the patient back but no answer on his phone.  Asencion Noble

## 2019-06-22 NOTE — Telephone Encounter (Signed)
This patient is in [place] and calling me for med refills.   He is wanting his xanax refilled.  I told him previously I would not cover this med any longer as he no longer is living in [place].  He states he will "return" in September.  Can you reach out to this patient to tell him I can no longer refill his medications without seeing him and specifically he needs a mental health provider for his xanax refills.  I tried to call the patient back but no answer on his phone.

## 2019-06-23 ENCOUNTER — Telehealth: Payer: Self-pay | Admitting: Critical Care Medicine

## 2019-06-23 ENCOUNTER — Other Ambulatory Visit: Payer: Self-pay | Admitting: Critical Care Medicine

## 2019-06-23 MED ORDER — PEN NEEDLES 31G X 5 MM MISC: 1.0000 [IU] | Freq: Three times a day (TID) | 3 refills | Status: AC

## 2019-06-23 MED ORDER — CLOPIDOGREL BISULFATE 75 MG PO TABS: 75.0000 mg | ORAL_TABLET | Freq: Every day | ORAL | 6 refills | Status: AC

## 2019-06-23 MED ORDER — FLUTICASONE PROPIONATE 50 MCG/ACT NA SUSP: 2.0000 | Freq: Every day | NASAL | 6 refills | Status: AC

## 2019-06-23 MED ORDER — ALPRAZOLAM 1 MG PO TABS: 1.0000 mg | ORAL_TABLET | Freq: Three times a day (TID) | ORAL | 0 refills | Status: DC

## 2019-06-23 MED ORDER — HYDROXYZINE HCL 50 MG PO TABS: 50.0000 mg | ORAL_TABLET | Freq: Three times a day (TID) | ORAL | 1 refills | Status: AC | PRN

## 2019-06-23 MED ORDER — FUROSEMIDE 40 MG PO TABS: 40.0000 mg | ORAL_TABLET | Freq: Two times a day (BID) | ORAL | 6 refills | Status: AC

## 2019-06-23 MED ORDER — INSULIN LISPRO (1 UNIT DIAL) 100 UNIT/ML (KWIKPEN): PEN_INJECTOR | SUBCUTANEOUS | 11 refills | Status: AC

## 2019-06-23 MED ORDER — TIZANIDINE HCL 4 MG PO CAPS: 4.0000 mg | ORAL_CAPSULE | Freq: Three times a day (TID) | ORAL | 1 refills | Status: AC

## 2019-06-23 MED ORDER — PREGABALIN 75 MG PO CAPS: 75.0000 mg | ORAL_CAPSULE | Freq: Two times a day (BID) | ORAL | 1 refills | Status: AC

## 2019-06-23 MED ORDER — METFORMIN HCL 500 MG PO TABS: 500.0000 mg | ORAL_TABLET | Freq: Two times a day (BID) | ORAL | 0 refills | Status: DC

## 2019-06-23 MED ORDER — LOSARTAN POTASSIUM 50 MG PO TABS: 50.0000 mg | ORAL_TABLET | Freq: Every day | ORAL | 11 refills | Status: AC

## 2019-06-23 NOTE — Telephone Encounter (Signed)
This patient contacted me requesting refills on his Xanax insulin and blood pressure medications and Plavix  I indicated to him I could only refill 1 mg of Xanax 3 times a day instead of the 2 mg since he is now seeing a pain specialist in [place] and is now on the opiates.  The patient dictates he will be returning to the [facility] area around 20 September.  I told him this would be the last refill that I can provide until I see him again.  The patient took this under advisement.  I did review the [place] controlled substance database including [place] and [place] and found that he had only received approximately 30 of 0.5 mg Xanax in July  He has received prescriptions for Percocet by pain specialist in [place] as well

## 2019-06-24 NOTE — Telephone Encounter (Signed)
MA unable to reach patient regarding refill.

## 2019-07-10 ENCOUNTER — Telehealth: Payer: Self-pay | Admitting: Critical Care Medicine

## 2019-07-10 ENCOUNTER — Other Ambulatory Visit: Payer: Self-pay | Admitting: Critical Care Medicine

## 2019-07-10 MED ORDER — ALPRAZOLAM 1 MG PO TABS: 1.0000 mg | ORAL_TABLET | Freq: Three times a day (TID) | ORAL | 0 refills | Status: AC

## 2019-07-10 NOTE — Progress Notes (Signed)
Xanax refill. See telephone note

## 2019-07-10 NOTE — Telephone Encounter (Signed)
This patient called me today wanting refills on his Xanax 1 mg 3 times daily.  No this is an early refill.  He claims to been admitted to the hospital in [place] for mental breakdown and his Xanax was reduced to 0.5 mg twice daily and I took his original prescriptions.  He is needing a refill now at this time.  I do not have records from this hospitalization.  He claims to be on a bus heading back to [place] at this time.  The plan will be to give a short-term refill on 1 mg 3 times daily of Xanax and then at that point see him when he returns to the [place] area  I indicated him this to be the last refill I would be able to provide for him if he does not return to [place] this week  I reviewed the [place] including [place] and [place] and did not find any other prescriptions for Xanax since the one I sent on August 24  He claims that the hospital kept his 1 mg 3 times daily give Xanax prescription

## 2019-07-11 NOTE — Telephone Encounter (Signed)
I tried to call the pharmacy and could not get through  I called the patient back and told him he needs to see another MD in [place] as  There is nothing further I can do here

## 2019-07-11 NOTE — Telephone Encounter (Signed)
[facility] in [place] called about Alprazolam Rx. Pharmacist states that she is uncomfortable filled Rx almost 2 weeks early especially since patient also had Rx filled for Alprazolam 0.5 mg Rx written by PCP on 06/09/2019.  I did pull in the records from [place] if you wanted to review.  She would to speak with you directly about this Rx prior to filling.

## 2019-07-23 ENCOUNTER — Encounter: Attending: Interventional Cardiology | Primary: Family Medicine

## 2019-10-22 LAB — AMB EXT HGBA1C
Hemoglobin A1C, External: 6.7 %
Hemoglobin A1c, External: 6.7 %

## 2020-09-09 IMAGING — DX DG CHEST 2V
2 series · 2 of 2 positions shown · non-contrast
Comparison: 10/11/2018

CLINICAL DATA: Chest pain.

EXAM:
CHEST - 2 VIEW

[x chest ap]
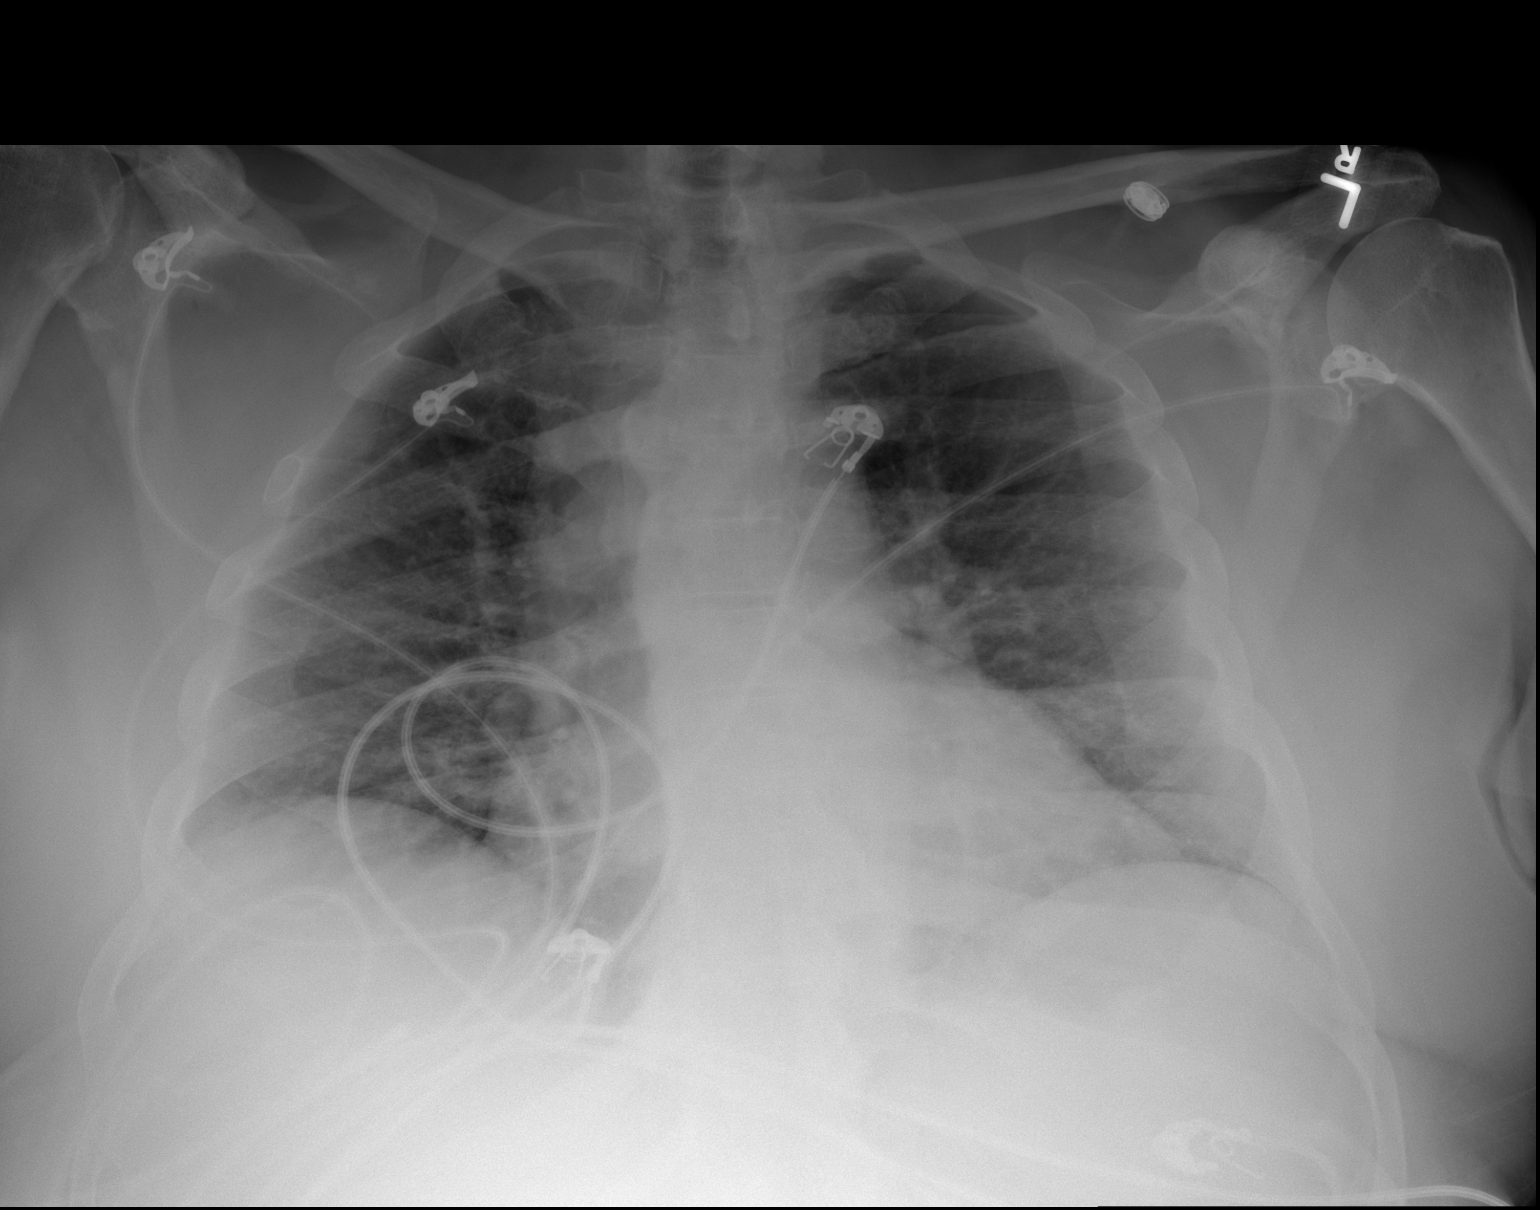

[w chest lat]
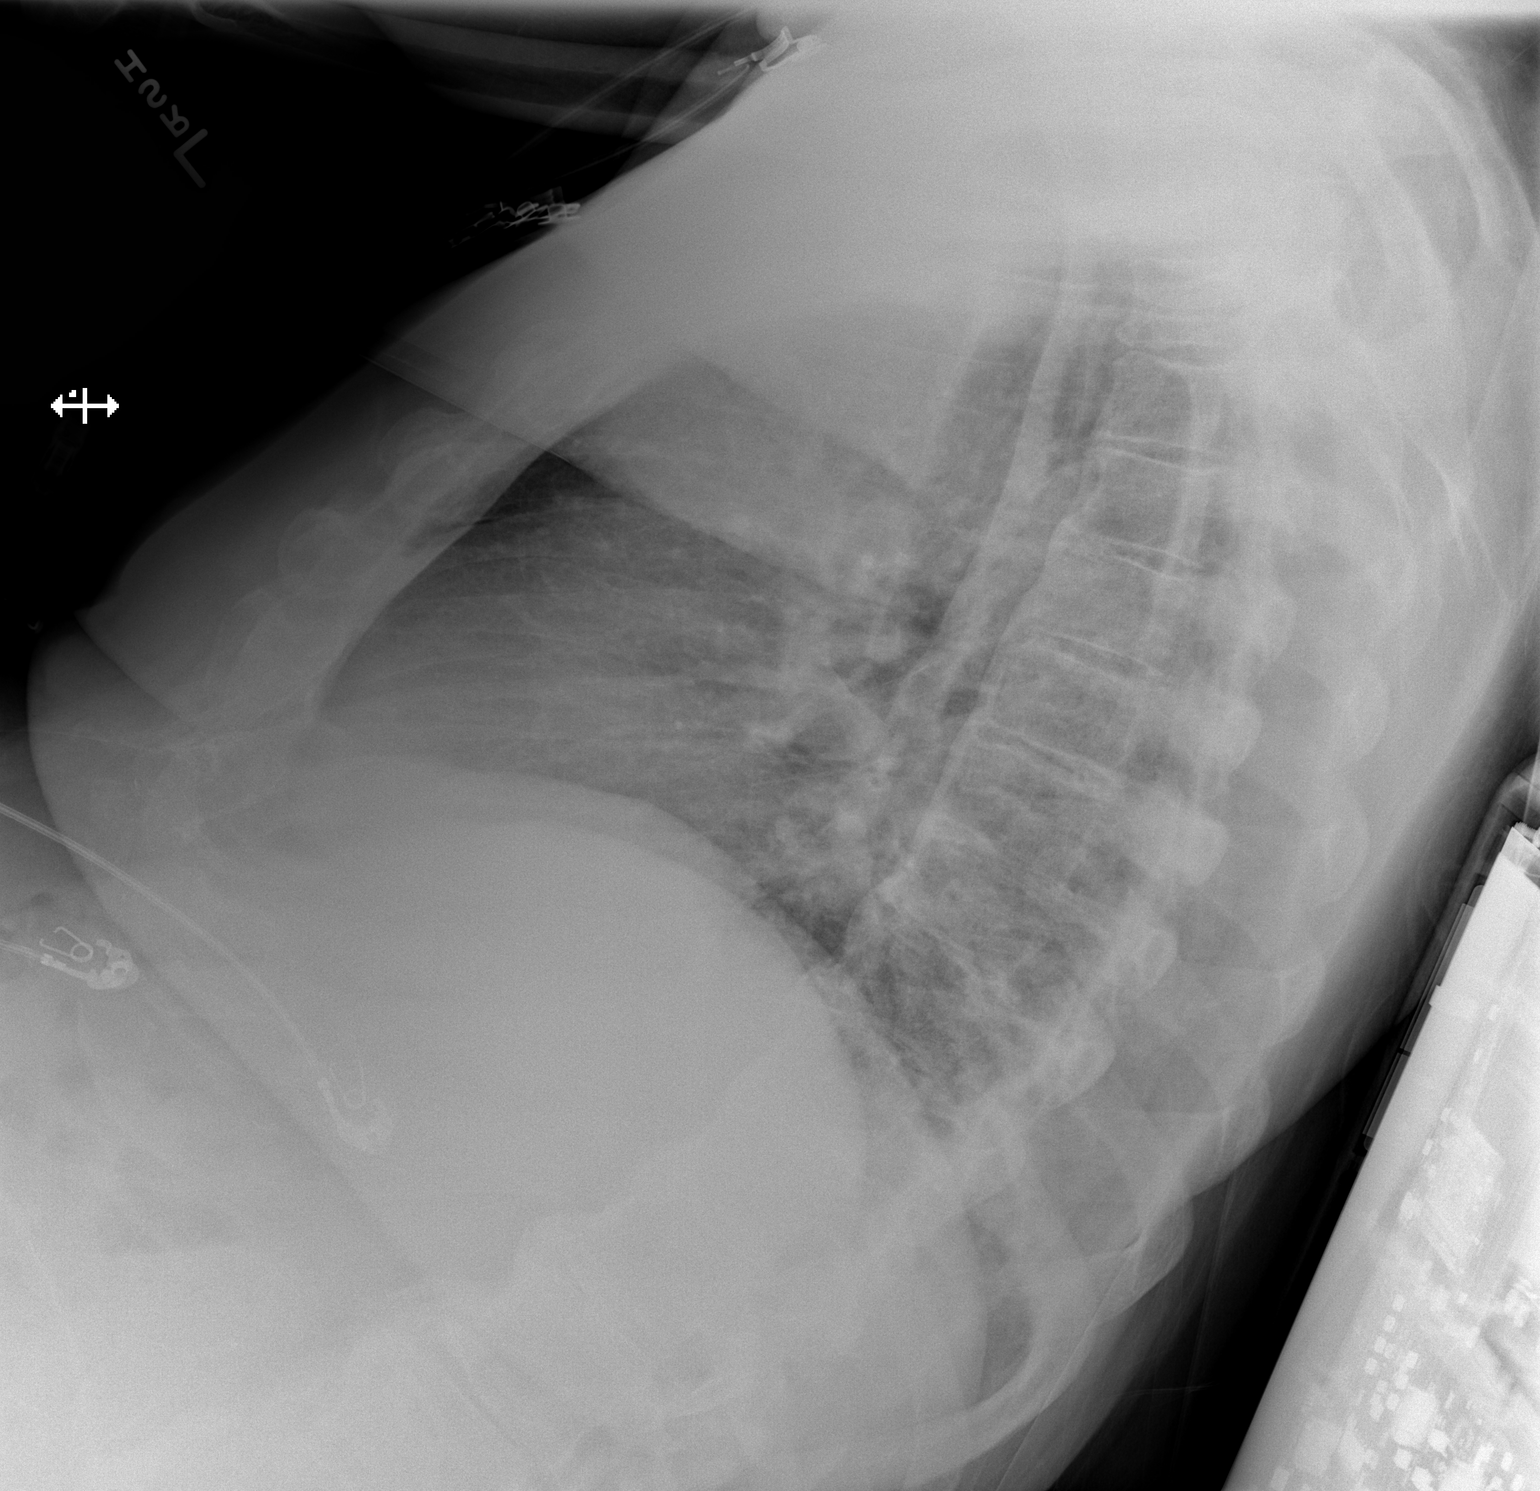

[2 of 2 positions shown; findings below may reference images not displayed]

FINDINGS: The heart size and mediastinal contours are within normal limits.
Both lungs are clear. The visualized skeletal structures are
unremarkable.
IMPRESSION: No active cardiopulmonary disease.
